# Patient Record
Sex: Male | Born: 1945
Health system: Southern US, Community
[De-identification: ages and names within clinical notes are randomized; demographics above are authoritative.]

## PROBLEM LIST (undated history)

## (undated) DIAGNOSIS — L989 Disorder of the skin and subcutaneous tissue, unspecified: Secondary | ICD-10-CM

## (undated) DIAGNOSIS — I10 Essential (primary) hypertension: Secondary | ICD-10-CM

## (undated) DIAGNOSIS — I2510 Atherosclerotic heart disease of native coronary artery without angina pectoris: Secondary | ICD-10-CM

## (undated) DIAGNOSIS — K219 Gastro-esophageal reflux disease without esophagitis: Secondary | ICD-10-CM

## (undated) DIAGNOSIS — N200 Calculus of kidney: Secondary | ICD-10-CM

## (undated) DIAGNOSIS — I499 Cardiac arrhythmia, unspecified: Secondary | ICD-10-CM

## (undated) DIAGNOSIS — Z87442 Personal history of urinary calculi: Secondary | ICD-10-CM

## (undated) DIAGNOSIS — R002 Palpitations: Secondary | ICD-10-CM

## (undated) DIAGNOSIS — I480 Paroxysmal atrial fibrillation: Secondary | ICD-10-CM

## (undated) DIAGNOSIS — M1990 Unspecified osteoarthritis, unspecified site: Secondary | ICD-10-CM

## (undated) DIAGNOSIS — E785 Hyperlipidemia, unspecified: Secondary | ICD-10-CM

## (undated) DIAGNOSIS — J309 Allergic rhinitis, unspecified: Secondary | ICD-10-CM

## (undated) DIAGNOSIS — M542 Cervicalgia: Secondary | ICD-10-CM

## (undated) HISTORY — DX: Hyperlipidemia, unspecified: E78.5

## (undated) HISTORY — DX: Paroxysmal atrial fibrillation: I48.0

## (undated) HISTORY — PX: URETERAL STENT PLACEMENT: SHX822

## (undated) HISTORY — DX: Disorder of the skin and subcutaneous tissue, unspecified: L98.9

## (undated) HISTORY — PX: OTHER SURGICAL HISTORY: SHX169

## (undated) HISTORY — DX: Cervicalgia: M54.2

## (undated) HISTORY — DX: Gastro-esophageal reflux disease without esophagitis: K21.9

## (undated) HISTORY — DX: Calculus of kidney: N20.0

## (undated) HISTORY — DX: Allergic rhinitis, unspecified: J30.9

## (undated) HISTORY — PX: CORONARY ARTERY BYPASS GRAFT: SHX141

## (undated) HISTORY — PX: THROAT SURGERY: SHX803

## (undated) HISTORY — PX: PROSTATE SURGERY: SHX751

## (undated) HISTORY — PX: CARPAL TUNNEL RELEASE: SHX101

## (undated) HISTORY — PX: HYDROCELE EXCISION / REPAIR: SUR1145

---

## 2005-08-30 HISTORY — PX: COLONOSCOPY: SHX174

## 2007-03-06 ENCOUNTER — Emergency Department: Payer: Self-pay | Admitting: Emergency Medicine

## 2010-08-30 HISTORY — PX: COLONOSCOPY: SHX174

## 2010-08-30 LAB — HM COLONOSCOPY: HM Colonoscopy: NORMAL

## 2012-04-11 ENCOUNTER — Encounter (HOSPITAL_COMMUNITY): Payer: Self-pay | Admitting: Emergency Medicine

## 2012-04-11 ENCOUNTER — Emergency Department (HOSPITAL_COMMUNITY)
Admission: EM | Admit: 2012-04-11 | Discharge: 2012-04-12 | Disposition: A | Discharge status: Home / Self Care | Payer: Medicare Other | Attending: Emergency Medicine | Admitting: Emergency Medicine

## 2012-04-11 DIAGNOSIS — I10 Essential (primary) hypertension: Secondary | ICD-10-CM | POA: Insufficient documentation

## 2012-04-11 DIAGNOSIS — Z87891 Personal history of nicotine dependence: Secondary | ICD-10-CM | POA: Insufficient documentation

## 2012-04-11 DIAGNOSIS — Y9289 Other specified places as the place of occurrence of the external cause: Secondary | ICD-10-CM | POA: Insufficient documentation

## 2012-04-11 DIAGNOSIS — K219 Gastro-esophageal reflux disease without esophagitis: Secondary | ICD-10-CM | POA: Insufficient documentation

## 2012-04-11 DIAGNOSIS — T7840XA Allergy, unspecified, initial encounter: Secondary | ICD-10-CM

## 2012-04-11 DIAGNOSIS — Y93K1 Activity, walking an animal: Secondary | ICD-10-CM | POA: Insufficient documentation

## 2012-04-11 DIAGNOSIS — Y998 Other external cause status: Secondary | ICD-10-CM | POA: Insufficient documentation

## 2012-04-11 HISTORY — DX: Palpitations: R00.2

## 2012-04-11 HISTORY — DX: Essential (primary) hypertension: I10

## 2012-04-11 HISTORY — DX: Calculus of kidney: N20.0

## 2012-04-11 HISTORY — DX: Gastro-esophageal reflux disease without esophagitis: K21.9

## 2012-04-11 NOTE — ED Notes (Signed)
Patient complaining of itching rash from ankles to thighs started yesterday. Small red, raised bumps noted to bilateral ankles radiating to bilateral upper thighs.

## 2012-04-12 MED ORDER — PREDNISONE 10 MG PO TABS: ORAL_TABLET | ORAL | Status: DC

## 2012-04-12 MED ORDER — PREDNISONE 20 MG PO TABS
60.0000 mg | ORAL_TABLET | Freq: Once | ORAL | Status: AC
  Administered 2012-04-12: 60 mg via ORAL
  Filled 2012-04-12: qty 3

## 2012-04-12 MED ORDER — HYDROXYZINE HCL 25 MG PO TABS: 25.0000 mg | ORAL_TABLET | Freq: Four times a day (QID) | ORAL | Status: AC | PRN

## 2012-04-12 NOTE — ED Provider Notes (Signed)
History     CSN: [id number]  Arrival date & time 04/11/12  2047   First MD Initiated Contact with Patient 04/11/12 2256      Chief Complaint  Patient presents with  . Rash    (Consider location/radiation/quality/duration/timing/severity/associated sxs/prior treatment) HPI Comments: Darius Grimes was walking his dog yesterday evening on a gravel road when he developed sudden onset of itching and burning to his bilateral ankles, stating it felt like he had 1000 ants swarming on him although he could visualize no reason for the symptoms.  He was wearing shorts with ankle socks at the time.  His wife who is with him had no similar symptoms.  He developed a rash at his ankles shortly after this event and since then it has spread to his upper legs along with scattered lesions on his forearms and abdomen.  He denies fevers or chills or other complaints.  There is been no drainage from these lesions.  He has been using rubbing alcohol to try to minimize itching but has tried no other medications.  He has had no shortness of breath, swelling or erythema noted at the site of these lesions.  The history is provided by the patient and the spouse.    Past Medical History  Diagnosis Date  . Hypertension   . Heart palpitations   . Acid reflux   . Kidney stones     Past Surgical History  Procedure Date  . Throat surgery   . Prostate surgery     History reviewed. No pertinent family history.  History  Substance Use Topics  . Smoking status: Former Games developer  . Smokeless tobacco: Not on file  . Alcohol Use: No      Review of Systems  Constitutional: Negative for fever and chills.  HENT: Negative for facial swelling.   Respiratory: Negative for shortness of breath and wheezing.   Skin: Positive for rash.  Neurological: Negative for numbness.    Allergies  Review of patient's allergies indicates no known allergies.  Home Medications   Current Outpatient Rx  Name Route Sig  Dispense Refill  . AMLODIPINE BESYLATE 10 MG PO TABS Oral Take 10 mg by mouth every morning.    . ASPIRIN 325 MG PO TABS Oral Take 325 mg by mouth every morning. *If heart rate is elevated in the morning, does not take dose*    . SUPER B COMPLEX PO Oral Take 1 tablet by mouth every morning.    . IBUPROFEN 800 MG PO TABS Oral Take 800 mg by mouth 2 (two) times daily as needed.    [name] LISINOPRIL-HYDROCHLOROTHIAZIDE 20-12.5 MG PO TABS Oral Take 2 tablets by mouth every morning.    [name] METOPROLOL TARTRATE 50 MG PO TABS Oral Take 50 mg by mouth 2 (two) times daily.    . NF FORMULAS TESTOSTERONE PO Oral Take 1 capsule by mouth every morning. DRUG NAME: TEST X180/Testosterone Booster  CONTAINS: Vitamin D 200IU, Vitamin B6 2mg , Vitamin B12 , Growth Enhancement Matrix 400mg  (Includes: Testofen, [place] Ginseng, Panax Ginseng, Codyceps sinesis,Tribulus terrestris    . ADULT MULTIVITAMIN W/MINERALS CH Oral Take 1 tablet by mouth every morning.    [name] OMEPRAZOLE 20 MG PO CPDR Oral Take 20 mg by mouth every morning.    [name] HYDROXYZINE HCL 25 MG PO TABS Oral Take 1 tablet (25 mg total) by mouth every 6 (six) hours as needed for itching. 20 tablet 0  . PREDNISONE 10 MG PO TABS  6, 5, 4,  3, 2 then 1 tablet by mouth daily for 6 days total. 21 tablet 0    BP 146/103  Pulse 87  Temp 98.3 F (36.8 C) (Oral)  Resp 16  Ht 5\' 10"  (1.778 m)  Wt 238 lb (107.956 kg)  BMI 34.15 kg/m2  SpO2 99%  Physical Exam  Constitutional: He appears well-developed and well-nourished. No distress.  HENT:  Head: Normocephalic.  Neck: Neck supple.  Cardiovascular: Normal rate.   Pulmonary/Chest: Effort normal. He has no wheezes.  Musculoskeletal: Normal range of motion. He exhibits no edema.  Skin: Rash noted. No erythema.       Very discrete 1-2 mm slightly raised red papules on his bilateral ankles.  There is no surrounding erythema, edema or red streaking.  The nodules that are on his upper legs to his mid thigh area are  more pink rather than red in coloration also without drainage.  There are no pustules, vesicles or excoriations appreciated.  He also has very few papules on his bilateral forearms.    ED Course  Procedures (including critical care time)  Labs Reviewed - No data to display No results found.   1. Allergic reaction       MDM  Allergic skin reaction is most likely source of symptoms given pruritic character.  No signs of infection at this time.  He was not walking anywhere including through grass or fields where he could come in contact with chiggers or other itch producing pests.  Patient placed on prednisone taper, given first dose prior to discharge.  He was also prescribed Atarax as needed for itching.  Recheck by PCP if not improving over the next several days or if his symptoms change or worsen.    Burgess Amor, PA 04/12/12 1600

## 2012-04-13 NOTE — ED Provider Notes (Signed)
Medical screening examination/treatment/procedure(s) were performed by non-physician practitioner and as supervising physician I was immediately available for consultation/collaboration.  Kaisyn Millea S. Skai Lickteig, MD 04/13/12 0432 

## 2012-06-23 LAB — LIPID PANEL
CHOLESTEROL: 206 mg/dL — AB (ref 0–200)
HDL: 35 mg/dL (ref 35–70)
LDL CALC: 138 mg/dL
Triglycerides: 167 mg/dL — AB (ref 40–160)

## 2012-06-23 LAB — PSA: PSA: NORMAL

## 2012-09-18 ENCOUNTER — Ambulatory Visit: Payer: Self-pay | Admitting: Family Medicine

## 2013-10-01 ENCOUNTER — Emergency Department (HOSPITAL_COMMUNITY)
Admission: EM | Admit: 2013-10-01 | Discharge: 2013-10-01 | Disposition: A | Discharge status: Home / Self Care | Payer: Medicare Other | Attending: Emergency Medicine | Admitting: Emergency Medicine

## 2013-10-01 ENCOUNTER — Encounter (HOSPITAL_COMMUNITY): Payer: Self-pay | Admitting: Emergency Medicine

## 2013-10-01 DIAGNOSIS — Z87891 Personal history of nicotine dependence: Secondary | ICD-10-CM | POA: Insufficient documentation

## 2013-10-01 DIAGNOSIS — K5289 Other specified noninfective gastroenteritis and colitis: Secondary | ICD-10-CM | POA: Insufficient documentation

## 2013-10-01 DIAGNOSIS — K529 Noninfective gastroenteritis and colitis, unspecified: Secondary | ICD-10-CM

## 2013-10-01 DIAGNOSIS — Z79899 Other long term (current) drug therapy: Secondary | ICD-10-CM | POA: Insufficient documentation

## 2013-10-01 DIAGNOSIS — K219 Gastro-esophageal reflux disease without esophagitis: Secondary | ICD-10-CM | POA: Insufficient documentation

## 2013-10-01 DIAGNOSIS — Z87442 Personal history of urinary calculi: Secondary | ICD-10-CM | POA: Insufficient documentation

## 2013-10-01 DIAGNOSIS — I10 Essential (primary) hypertension: Secondary | ICD-10-CM | POA: Insufficient documentation

## 2013-10-01 DIAGNOSIS — Z7982 Long term (current) use of aspirin: Secondary | ICD-10-CM | POA: Insufficient documentation

## 2013-10-01 LAB — COMPREHENSIVE METABOLIC PANEL
ALBUMIN: 3.7 g/dL (ref 3.5–5.2)
ALK PHOS: 56 U/L (ref 39–117)
ALT: 16 U/L (ref 0–53)
AST: 23 U/L (ref 0–37)
BILIRUBIN TOTAL: 0.5 mg/dL (ref 0.3–1.2)
BUN: 16 mg/dL (ref 6–23)
CHLORIDE: 103 meq/L (ref 96–112)
CO2: 29 meq/L (ref 19–32)
Calcium: 8.9 mg/dL (ref 8.4–10.5)
Creatinine, Ser: 0.89 mg/dL (ref 0.50–1.35)
GFR calc Af Amer: >90 mL/min (ref >90)
GFR, EST NON AFRICAN AMERICAN: 87 mL/min — AB (ref >90)
Glucose, Bld: 129 mg/dL — ABNORMAL HIGH (ref 70–99)
POTASSIUM: 3.1 meq/L — AB (ref 3.7–5.3)
SODIUM: 144 meq/L (ref 137–147)
Total Protein: 6.9 g/dL (ref 6.0–8.3)

## 2013-10-01 LAB — CBC WITH DIFFERENTIAL/PLATELET
BASOS ABS: 0 10*3/uL (ref 0.0–0.1)
BASOS PCT: 1 % (ref 0–1)
Eosinophils Absolute: 0.1 10*3/uL (ref 0.0–0.7)
Eosinophils Relative: 2 % (ref 0–5)
HEMATOCRIT: 42.2 % (ref 39.0–52.0)
Hemoglobin: 14.9 g/dL (ref 13.0–17.0)
LYMPHS PCT: 26 % (ref 12–46)
Lymphs Abs: 1.7 10*3/uL (ref 0.7–4.0)
MCH: 31.2 pg (ref 26.0–34.0)
MCHC: 35.3 g/dL (ref 30.0–36.0)
MCV: 88.5 fL (ref 78.0–100.0)
MONO ABS: 0.4 10*3/uL (ref 0.1–1.0)
Monocytes Relative: 6 % (ref 3–12)
NEUTROS ABS: 4.2 10*3/uL (ref 1.7–7.7)
NEUTROS PCT: 65 % (ref 43–77)
Platelets: 200 10*3/uL (ref 150–400)
RBC: 4.77 MIL/uL (ref 4.22–5.81)
RDW: 12.5 % (ref 11.5–15.5)
WBC: 6.4 10*3/uL (ref 4.0–10.5)

## 2013-10-01 MED ORDER — POTASSIUM CHLORIDE CRYS ER 20 MEQ PO TBCR
40.0000 meq | EXTENDED_RELEASE_TABLET | Freq: Once | ORAL | Status: AC
  Administered 2013-10-01: 40 meq via ORAL
  Filled 2013-10-01: qty 2

## 2013-10-01 MED ORDER — SODIUM CHLORIDE 0.9 % IV BOLUS (SEPSIS)
1000.0000 mL | Freq: Once | INTRAVENOUS | Status: AC
  Administered 2013-10-01: 1000 mL via INTRAVENOUS

## 2013-10-01 NOTE — ED Notes (Signed)
Discharge instructions reviewed with pt, questions answered. Pt verbalized understanding.  

## 2013-10-01 NOTE — Discharge Instructions (Signed)
Drink plenty of fluids and follow up with your md if any problems

## 2013-10-01 NOTE — ED Provider Notes (Signed)
CSN: [id number]     Arrival date & time 10/01/13  1444 History   First MD Initiated Contact with Patient 10/01/13 1510     Chief Complaint  Patient presents with  . Abdominal Pain   (Consider location/radiation/quality/duration/timing/severity/associated sxs/prior Treatment) Patient is a 68 y.o. male presenting with diarrhea. The history is provided by the patient (the pt complains of diarhea for a couple days with abd cramps).  Diarrhea Quality:  Watery Severity:  Moderate Onset quality:  Sudden Timing:  Constant Progression:  Unchanged Relieved by:  Nothing Associated symptoms: no abdominal pain and no headaches     Past Medical History  Diagnosis Date  . Hypertension   . Heart palpitations   . Acid reflux   . Kidney stones    Past Surgical History  Procedure Laterality Date  . Throat surgery    . Prostate surgery     History reviewed. No pertinent family history. History  Substance Use Topics  . Smoking status: Former Smoker -- 1.00 packs/day for 7 years    Types: Cigarettes    Quit date: 08/31/1983  . Smokeless tobacco: Never Used  . Alcohol Use: No    Review of Systems  Constitutional: Negative for appetite change and fatigue.  HENT: Negative for congestion, ear discharge and sinus pressure.   Eyes: Negative for discharge.  Respiratory: Negative for cough.   Cardiovascular: Negative for chest pain.  Gastrointestinal: Positive for diarrhea. Negative for abdominal pain.  Genitourinary: Negative for frequency and hematuria.  Musculoskeletal: Negative for back pain.  Skin: Negative for rash.  Neurological: Negative for seizures and headaches.  Psychiatric/Behavioral: Negative for hallucinations.    Allergies  Review of patient's allergies indicates no known allergies.  Home Medications   Current Outpatient Rx  Name  Route  Sig  Dispense  Refill  . amLODipine (NORVASC) 10 MG tablet   Oral   Take 10 mg by mouth every morning.         [name] aspirin 325 MG  tablet   Oral   Take 325 mg by mouth every morning. *If heart rate is elevated in the morning, does not take dose*         . B Complex-C (SUPER B COMPLEX PO)   Oral   Take 1 tablet by mouth every morning.         [name] ibuprofen (ADVIL,MOTRIN) 800 MG tablet   Oral   Take 800 mg by mouth 2 (two) times daily as needed for moderate pain.          [name] lisinopril-hydrochlorothiazide (PRINZIDE,ZESTORETIC) 20-12.5 MG per tablet   Oral   Take 2 tablets by mouth every morning.         . metoprolol (LOPRESSOR) 50 MG tablet   Oral   Take 25 mg by mouth 2 (two) times daily.          . Misc Natural Products (NF FORMULAS TESTOSTERONE PO)   Oral   Take 1 capsule by mouth every morning. DRUG NAME: TEST X180/Testosterone Booster  CONTAINS: Vitamin D 200IU, Vitamin B6 2mg , Vitamin B12 133mcg, Growth Enhancement Matrix 400mg  (Includes: Testofen, [place] Ginseng, Panax Ginseng, Codyceps sinesis,Tribulus terrestris         . Multiple Vitamin (MULTIVITAMIN WITH MINERALS) TABS   Oral   Take 1 tablet by mouth every morning.         [name] omeprazole (PRILOSEC) 20 MG capsule   Oral   Take 20 mg by mouth daily as needed (acid  reflux).  BP 153/92  Pulse 109  Temp(Src) 97.6 F (36.4 C) (Oral)  Resp 18  Ht 5' 10.5" (1.791 m)  Wt 240 lb (108.863 kg)  BMI 33.94 kg/m2  SpO2 98% Physical Exam  Constitutional: He is oriented to person, place, and time. He appears well-developed.  HENT:  Head: Normocephalic.  Eyes: Conjunctivae and EOM are normal. No scleral icterus.  Neck: Neck supple. No thyromegaly present.  Cardiovascular: Normal rate and regular rhythm.  Exam reveals no gallop and no friction rub.   No murmur heard. Pulmonary/Chest: No stridor. He has no wheezes. He has no rales. He exhibits no tenderness.  Abdominal: He exhibits no distension. There is no tenderness. There is no rebound.  Musculoskeletal: Normal range of motion. He exhibits no edema.  Lymphadenopathy:    He  has no cervical adenopathy.  Neurological: He is oriented to person, place, and time. He exhibits normal muscle tone. Coordination normal.  Skin: No rash noted. No erythema.  Psychiatric: He has a normal mood and affect. His behavior is normal.    ED Course  Procedures (including critical care time) Labs Review Labs Reviewed  COMPREHENSIVE METABOLIC PANEL - Abnormal; Notable for the following:    Potassium 3.1 (*)    Glucose, Bld 129 (*)    GFR calc non Af Amer 87 (*)    All other components within normal limits  CBC WITH DIFFERENTIAL   Imaging Review No results found.  EKG Interpretation   None       MDM   1. Gastroenteritis        Maudry Diego, MD 10/01/13 [phone]

## 2013-10-01 NOTE — ED Notes (Signed)
Patient c/o abd pain with nausea and diarrhea. Denies any vomiting. Patient unsure of any fevers but reports "chills and sweats."

## 2013-12-24 ENCOUNTER — Emergency Department: Payer: Self-pay | Admitting: Emergency Medicine

## 2013-12-24 LAB — BASIC METABOLIC PANEL
ANION GAP: 5 — AB (ref 7–16)
BUN: 11 mg/dL (ref 7–18)
CALCIUM: 9.2 mg/dL (ref 8.5–10.1)
CHLORIDE: 103 mmol/L (ref 98–107)
CO2: 32 mmol/L (ref 21–32)
CREATININE: 0.96 mg/dL (ref 0.60–1.30)
Glucose: 112 mg/dL — ABNORMAL HIGH (ref 65–99)
OSMOLALITY: 280 (ref 275–301)
Potassium: 3.3 mmol/L — ABNORMAL LOW (ref 3.5–5.1)
Sodium: 140 mmol/L (ref 136–145)

## 2013-12-24 LAB — CBC
HCT: 47.3 % (ref 40.0–52.0)
HGB: 16.2 g/dL (ref 13.0–18.0)
MCH: 31 pg (ref 26.0–34.0)
MCHC: 34.2 g/dL (ref 32.0–36.0)
MCV: 91 fL (ref 80–100)
Platelet: 216 10*3/uL (ref 150–440)
RBC: 5.22 10*6/uL (ref 4.40–5.90)
RDW: 13.5 % (ref 11.5–14.5)
WBC: 7.9 10*3/uL (ref 3.8–10.6)

## 2013-12-24 LAB — TROPONIN I

## 2013-12-24 LAB — PRO B NATRIURETIC PEPTIDE: B-TYPE NATIURETIC PEPTID: 310 pg/mL — AB (ref 0–125)

## 2013-12-24 LAB — CK TOTAL AND CKMB (NOT AT ARMC)
CK, TOTAL: 56 U/L
CK-MB: 1.7 ng/mL (ref 0.5–3.6)

## 2014-08-30 HISTORY — PX: CORONARY ARTERY BYPASS GRAFT: SHX141

## 2014-10-09 ENCOUNTER — Emergency Department (HOSPITAL_COMMUNITY): Payer: Medicare Other

## 2014-10-09 ENCOUNTER — Encounter (HOSPITAL_COMMUNITY): Payer: Self-pay | Admitting: Emergency Medicine

## 2014-10-09 ENCOUNTER — Emergency Department (HOSPITAL_COMMUNITY)
Admission: EM | Admit: 2014-10-09 | Discharge: 2014-10-09 | Disposition: A | Discharge status: Home / Self Care | Payer: Medicare Other | Attending: Emergency Medicine | Admitting: Emergency Medicine

## 2014-10-09 DIAGNOSIS — Z87891 Personal history of nicotine dependence: Secondary | ICD-10-CM | POA: Diagnosis not present

## 2014-10-09 DIAGNOSIS — Y9289 Other specified places as the place of occurrence of the external cause: Secondary | ICD-10-CM | POA: Insufficient documentation

## 2014-10-09 DIAGNOSIS — Z79899 Other long term (current) drug therapy: Secondary | ICD-10-CM | POA: Insufficient documentation

## 2014-10-09 DIAGNOSIS — S80211A Abrasion, right knee, initial encounter: Secondary | ICD-10-CM | POA: Diagnosis not present

## 2014-10-09 DIAGNOSIS — Y9389 Activity, other specified: Secondary | ICD-10-CM | POA: Diagnosis not present

## 2014-10-09 DIAGNOSIS — K219 Gastro-esophageal reflux disease without esophagitis: Secondary | ICD-10-CM | POA: Diagnosis not present

## 2014-10-09 DIAGNOSIS — I10 Essential (primary) hypertension: Secondary | ICD-10-CM | POA: Insufficient documentation

## 2014-10-09 DIAGNOSIS — W01110A Fall on same level from slipping, tripping and stumbling with subsequent striking against sharp glass, initial encounter: Secondary | ICD-10-CM | POA: Insufficient documentation

## 2014-10-09 DIAGNOSIS — Y998 Other external cause status: Secondary | ICD-10-CM | POA: Insufficient documentation

## 2014-10-09 DIAGNOSIS — Z87442 Personal history of urinary calculi: Secondary | ICD-10-CM | POA: Insufficient documentation

## 2014-10-09 DIAGNOSIS — S8991XA Unspecified injury of right lower leg, initial encounter: Secondary | ICD-10-CM | POA: Diagnosis present

## 2014-10-09 DIAGNOSIS — S8001XA Contusion of right knee, initial encounter: Secondary | ICD-10-CM | POA: Diagnosis not present

## 2014-10-09 DIAGNOSIS — W010XXA Fall on same level from slipping, tripping and stumbling without subsequent striking against object, initial encounter: Secondary | ICD-10-CM

## 2014-10-09 DIAGNOSIS — Z7982 Long term (current) use of aspirin: Secondary | ICD-10-CM | POA: Diagnosis not present

## 2014-10-09 NOTE — ED Provider Notes (Signed)
CSN: [id number]     Arrival date & time 10/09/14  0617 History   First MD Initiated Contact with Patient 10/09/14 0622     Chief Complaint  Patient presents with  . Fall     (Consider location/radiation/quality/duration/timing/severity/associated sxs/prior Treatment) Patient is a 69 y.o. male presenting with fall. The history is provided by the patient.  Fall  He tripped going up some steps and his right knee hit a glass door. He severed an abrasion to the knee. He has had a lot of pain in the knee since the fall. He rates pain at 7/10 at rest, but 10/10 if he tries to walk on it. He denies other injury. Injury occurred at about 10 PM. Of note, he is on aspirin, but no other antiplatelet agents and no anticoagulants. Last tetanus immunization was within the past year. He took a dose of oxycodone-acetaminophen last night, and took a dose of ibuprofen 800 mg this morning. He states that sometimes it feels like his knee is getting locked in the posterior aspect.  Past Medical History  Diagnosis Date  . Hypertension   . Heart palpitations   . Acid reflux   . Kidney stones    Past Surgical History  Procedure Laterality Date  . Throat surgery    . Prostate surgery     No family history on file. History  Substance Use Topics  . Smoking status: Former Smoker -- 1.00 packs/day for 7 years    Types: Cigarettes    Quit date: 08/31/1983  . Smokeless tobacco: Never Used  . Alcohol Use: No    Review of Systems  All other systems reviewed and are negative.     Allergies  Review of patient's allergies indicates no known allergies.  Home Medications   Prior to Admission medications   Medication Sig Start Date End Date Taking? Authorizing Provider  amLODipine (NORVASC) 10 MG tablet Take 10 mg by mouth every morning.    Historical Provider, MD  aspirin 325 MG tablet Take 325 mg by mouth every morning. *If heart rate is elevated in the morning, does not take dose*    Historical  Provider, MD  B Complex-C (SUPER B COMPLEX PO) Take 1 tablet by mouth every morning.    Historical Provider, MD  ibuprofen (ADVIL,MOTRIN) 800 MG tablet Take 800 mg by mouth 2 (two) times daily as needed for moderate pain.     Historical Provider, MD  lisinopril-hydrochlorothiazide (PRINZIDE,ZESTORETIC) 20-12.5 MG per tablet Take 2 tablets by mouth every morning.    Historical Provider, MD  metoprolol (LOPRESSOR) 50 MG tablet Take 25 mg by mouth 2 (two) times daily.     Historical Provider, MD  Misc Natural Products (NF FORMULAS TESTOSTERONE PO) Take 1 capsule by mouth every morning. DRUG NAME: TEST X180/Testosterone Booster  CONTAINS: Vitamin D 200IU, Vitamin B6 2mg , Vitamin B12 148mcg, Growth Enhancement Matrix 400mg  (Includes: Testofen, [place] Ginseng, Panax Ginseng, Codyceps sinesis,Tribulus terrestris    Historical Provider, MD  Multiple Vitamin (MULTIVITAMIN WITH MINERALS) TABS Take 1 tablet by mouth every morning.    Historical Provider, MD  omeprazole (PRILOSEC) 20 MG capsule Take 20 mg by mouth daily as needed (acid  reflux).     Historical Provider, MD   BP 126/95 mmHg  Pulse 87  Temp(Src) 97.7 F (36.5 C) (Oral)  Resp 14  SpO2 96% Physical Exam  Nursing note and vitals reviewed.  69 year old male, resting comfortably and in no acute distress. Vital signs are difficult for  mild hypertension. Oxygen saturation is 96%, which is normal. Head is normocephalic and atraumatic. PERRLA, EOMI. Oropharynx is clear. Neck is nontender and supple without adenopathy or JVD. Back is nontender and there is no CVA tenderness. Lungs are clear without rales, wheezes, or rhonchi. Chest is nontender. Heart has regular rate and rhythm without murmur. Abdomen is soft, flat, nontender without masses or hepatosplenomegaly and peristalsis is normoactive. Extremities: Abrasion is present over the proximal right lower leg without discrete laceration. There is tenderness to palpation in this area. There is  no significant knee effusion present. There is no instability of the knee joint. There is pain with passive range of motion and I am unable to perform Lachman or McMurray's maneuvers. Distal neurovascular exam is intact. He has 1-2+ pitting edema with mild to moderate venous stasis changes present bilaterally. Skin is warm and dry without rash. Neurologic: Mental status is normal, cranial nerves are intact, there are no motor or sensory deficits.  ED Course  Procedures (including critical care time)  Imaging Review Dg Knee Complete 4 Views Right  10/09/2014   CLINICAL DATA:  Status post fall while walking dog; posterior right knee pain. Initial encounter.  EXAM: RIGHT KNEE - COMPLETE 4+ VIEW  COMPARISON:  None.  FINDINGS: There is no evidence of fracture or dislocation. There is mild narrowing of the medial and patellofemoral compartments, with small marginal osteophytes noted arising at all three compartments. Wall osteophytes and tibial spine osteophytes are also seen. A fabella is noted. Minimal degenerative calcification is noted about the fibular head.  A small to moderate knee joint effusion is noted. The visualized soft tissues are otherwise unremarkable in appearance.  IMPRESSION: 1. No evidence of acute fracture or dislocation. 2. Mild narrowing of the medial and patellofemoral compartments, with mild tricompartmental osteoarthritis. 3. Small to moderate knee joint effusion.   Electronically Signed   By: Garald Balding M.D.   On: 10/09/2014 06:58   Images viewed by me.  MDM   Final diagnoses:  Fall from slip, trip, or stumble  Contusion of right knee, initial encounter  Abrasion of right knee, initial encounter    Fall with injury to the right knee and proximal lower leg. He is sent for x-rays.  X-rays are negative for fracture, although official radiologist interpretation is pending at this time. I've discussed this with the patient. He has a knee immobilizer at home and he has  oxycodone-acetaminophen at home as well as ibuprofen. He was offered crutches and a walker and declined both of them. He is given contusion and abrasion instructions and he is referred to orthopedics for follow-up.    Delora Fuel, MD 58/83/25 [id number]

## 2014-10-09 NOTE — ED Notes (Signed)
Pt. Reports tripping last night and injuring right knee. Pt. Denies hitting head or any other pain.

## 2014-10-09 NOTE — Discharge Instructions (Signed)
Wear your immobilizer as needed. Take your oxycodone-acetaminophen as needed. Take your ibuprofen as needed for less severe pain.   Contusion A contusion is a deep bruise. Contusions are the result of an injury that caused bleeding under the skin. The contusion may turn blue, purple, or yellow. Minor injuries will give you a painless contusion, but more severe contusions may stay painful and swollen for a few weeks.  CAUSES  A contusion is usually caused by a blow, trauma, or direct force to an area of the body. SYMPTOMS   Swelling and redness of the injured area.  Bruising of the injured area.  Tenderness and soreness of the injured area.  Pain. DIAGNOSIS  The diagnosis can be made by taking a history and physical exam. An X-ray, CT scan, or MRI may be needed to determine if there were any associated injuries, such as fractures. TREATMENT  Specific treatment will depend on what area of the body was injured. In general, the best treatment for a contusion is resting, icing, elevating, and applying cold compresses to the injured area. Over-the-counter medicines may also be recommended for pain control. Ask your caregiver what the best treatment is for your contusion. HOME CARE INSTRUCTIONS   Put ice on the injured area.  Put ice in a plastic bag.  Place a towel between your skin and the bag.  Leave the ice on for 15-20 minutes, 3-4 times a day, or as directed by your health care provider.  Only take over-the-counter or prescription medicines for pain, discomfort, or fever as directed by your caregiver. Your caregiver may recommend avoiding anti-inflammatory medicines (aspirin, ibuprofen, and naproxen) for 48 hours because these medicines may increase bruising.  Rest the injured area.  If possible, elevate the injured area to reduce swelling. SEEK IMMEDIATE MEDICAL CARE IF:   You have increased bruising or swelling.  You have pain that is getting worse.  Your swelling or pain is  not relieved with medicines. MAKE SURE YOU:   Understand these instructions.  Will watch your condition.  Will get help right away if you are not doing well or get worse. Document Released: 05/26/2005 Document Revised: 08/21/2013 Document Reviewed: 06/21/2011 [facility] Patient Information 2015 [place], [place]. This information is not intended to replace advice given to you by your health care provider. Make sure you discuss any questions you have with your health care provider.  Abrasion An abrasion is a cut or scrape of the skin. Abrasions do not extend through all layers of the skin and most heal within 10 days. It is important to care for your abrasion properly to prevent infection. CAUSES  Most abrasions are caused by falling on, or gliding across, the ground or other surface. When your skin rubs on something, the outer and inner layer of skin rubs off, causing an abrasion. DIAGNOSIS  Your caregiver will be able to diagnose an abrasion during a physical exam.  TREATMENT  Your treatment depends on how large and deep the abrasion is. Generally, your abrasion will be cleaned with water and a mild soap to remove any dirt or debris. An antibiotic ointment may be put over the abrasion to prevent an infection. A bandage (dressing) may be wrapped around the abrasion to keep it from getting dirty.  You may need a tetanus shot if:  You cannot remember when you had your last tetanus shot.  You have never had a tetanus shot.  The injury broke your skin. If you get a tetanus shot, your arm  may swell, get red, and feel warm to the touch. This is common and not a problem. If you need a tetanus shot and you choose not to have one, there is a rare chance of getting tetanus. Sickness from tetanus can be serious.  HOME CARE INSTRUCTIONS   If a dressing was applied, change it at least once a day or as directed by your caregiver. If the bandage sticks, soak it off with warm water.   Wash the area with  water and a mild soap to remove all the ointment 2 times a day. Rinse off the soap and pat the area dry with a clean towel.   Reapply any ointment as directed by your caregiver. This will help prevent infection and keep the bandage from sticking. Use gauze over the wound and under the dressing to help keep the bandage from sticking.   Change your dressing right away if it becomes wet or dirty.   Only take over-the-counter or prescription medicines for pain, discomfort, or fever as directed by your caregiver.   Follow up with your caregiver within 24-48 hours for a wound check, or as directed. If you were not given a wound-check appointment, look closely at your abrasion for redness, swelling, or pus. These are signs of infection. SEEK IMMEDIATE MEDICAL CARE IF:   You have increasing pain in the wound.   You have redness, swelling, or tenderness around the wound.   You have pus coming from the wound.   You have a fever or persistent symptoms for more than 2-3 days.  You have a fever and your symptoms suddenly get worse.  You have a bad smell coming from the wound or dressing.  MAKE SURE YOU:   Understand these instructions.  Will watch your condition.  Will get help right away if you are not doing well or get worse. Document Released: 05/26/2005 Document Revised: 08/02/2012 Document Reviewed: 07/20/2011 [facility] Patient Information 2015 [place], [place]. This information is not intended to replace advice given to you by your health care provider. Make sure you discuss any questions you have with your health care provider.  Fall Prevention and Home Safety Falls cause injuries and can affect all age groups. It is possible to use preventive measures to significantly decrease the likelihood of falls. There are many simple measures which can make your home safer and prevent falls. OUTDOORS  Repair cracks and edges of walkways and driveways.  Remove high doorway  thresholds.  Trim shrubbery on the main path into your home.  Have good outside lighting.  Clear walkways of tools, rocks, debris, and clutter.  Check that handrails are not broken and are securely fastened. Both sides of steps should have handrails.  Have leaves, snow, and ice cleared regularly.  Use sand or salt on walkways during winter months.  In the garage, clean up grease or oil spills. BATHROOM  Install night lights.  Install grab bars by the toilet and in the tub and shower.  Use non-skid mats or decals in the tub or shower.  Place a plastic non-slip stool in the shower to sit on, if needed.  Keep floors dry and clean up all water on the floor immediately.  Remove soap buildup in the tub or shower on a regular basis.  Secure bath mats with non-slip, double-sided rug tape.  Remove throw rugs and tripping hazards from the floors. BEDROOMS  Install night lights.  Make sure a bedside light is easy to reach.  Do not use  oversized bedding.  Keep a telephone by your bedside.  Have a firm chair with side arms to use for getting dressed.  Remove throw rugs and tripping hazards from the floor. KITCHEN  Keep handles on pots and pans turned toward the center of the stove. Use back burners when possible.  Clean up spills quickly and allow time for drying.  Avoid walking on wet floors.  Avoid hot utensils and knives.  Position shelves so they are not too high or low.  Place commonly used objects within easy reach.  If necessary, use a sturdy step stool with a grab bar when reaching.  Keep electrical cables out of the way.  Do not use floor polish or wax that makes floors slippery. If you must use wax, use non-skid floor wax.  Remove throw rugs and tripping hazards from the floor. STAIRWAYS  Never leave objects on stairs.  Place handrails on both sides of stairways and use them. Fix any loose handrails. Make sure handrails on both sides of the stairways  are as long as the stairs.  Check carpeting to make sure it is firmly attached along stairs. Make repairs to worn or loose carpet promptly.  Avoid placing throw rugs at the top or bottom of stairways, or properly secure the rug with carpet tape to prevent slippage. Get rid of throw rugs, if possible.  Have an electrician put in a light switch at the top and bottom of the stairs. OTHER FALL PREVENTION TIPS  Wear low-heel or rubber-soled shoes that are supportive and fit well. Wear closed toe shoes.  When using a stepladder, make sure it is fully opened and both spreaders are firmly locked. Do not climb a closed stepladder.  Add color or contrast paint or tape to grab bars and handrails in your home. Place contrasting color strips on first and last steps.  Learn and use mobility aids as needed. Install an electrical emergency response system.  Turn on lights to avoid dark areas. Replace light bulbs that burn out immediately. Get light switches that glow.  Arrange furniture to create clear pathways. Keep furniture in the same place.  Firmly attach carpet with non-skid or double-sided tape.  Eliminate uneven floor surfaces.  Select a carpet pattern that does not visually hide the edge of steps.  Be aware of all pets. OTHER HOME SAFETY TIPS  Set the water temperature for 120 F (48.8 C).  Keep emergency numbers on or near the telephone.  Keep smoke detectors on every level of the home and near sleeping areas. Document Released: 08/06/2002 Document Revised: 02/15/2012 Document Reviewed: 11/05/2011 [facility] Patient Information 2015 [place], [place]. This information is not intended to replace advice given to you by your health care provider. Make sure you discuss any questions you have with your health care provider.

## 2014-10-09 NOTE — ED Notes (Signed)
MD at bedside. 

## 2015-02-11 IMAGING — DX DG KNEE COMPLETE 4+V*R*
4 series · 4 of 4 positions shown · non-contrast
Comparison: None.

CLINICAL DATA: Status post fall while walking dog; posterior right
knee pain. Initial encounter.

EXAM:
RIGHT KNEE - COMPLETE 4+ VIEW

[knee ap]
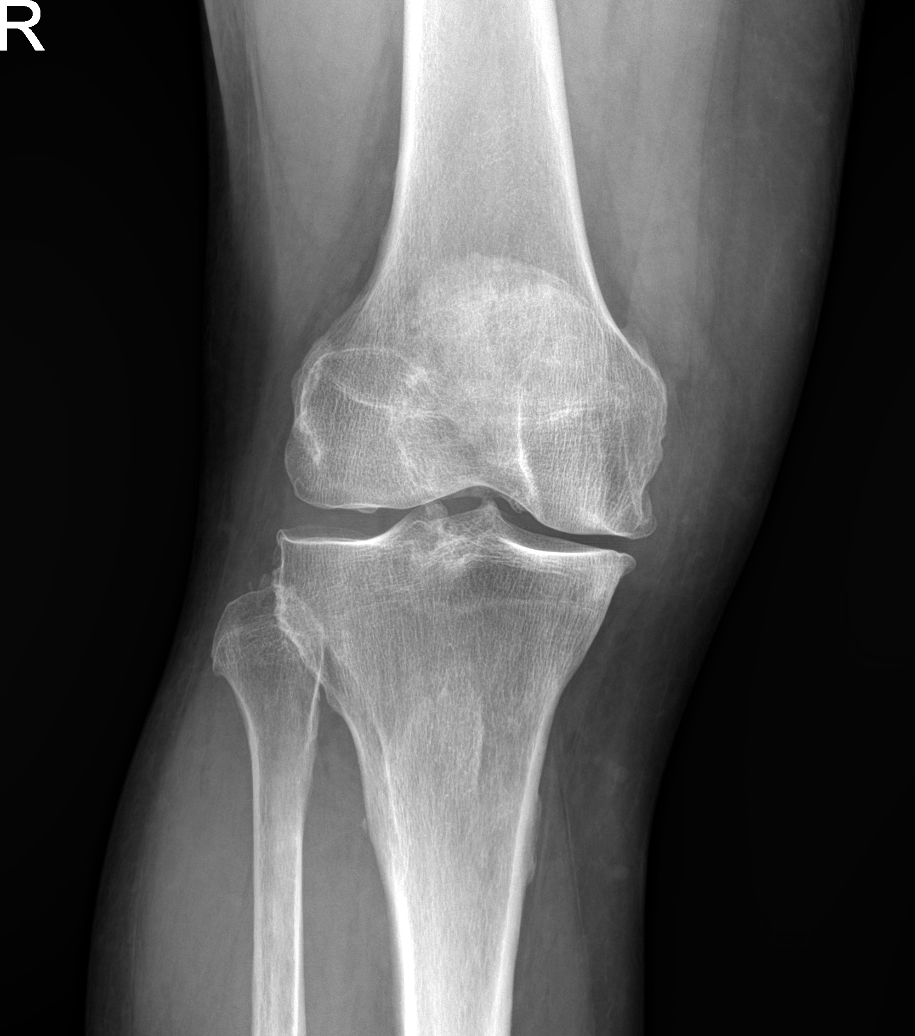

[knee obl (1 of 2)]
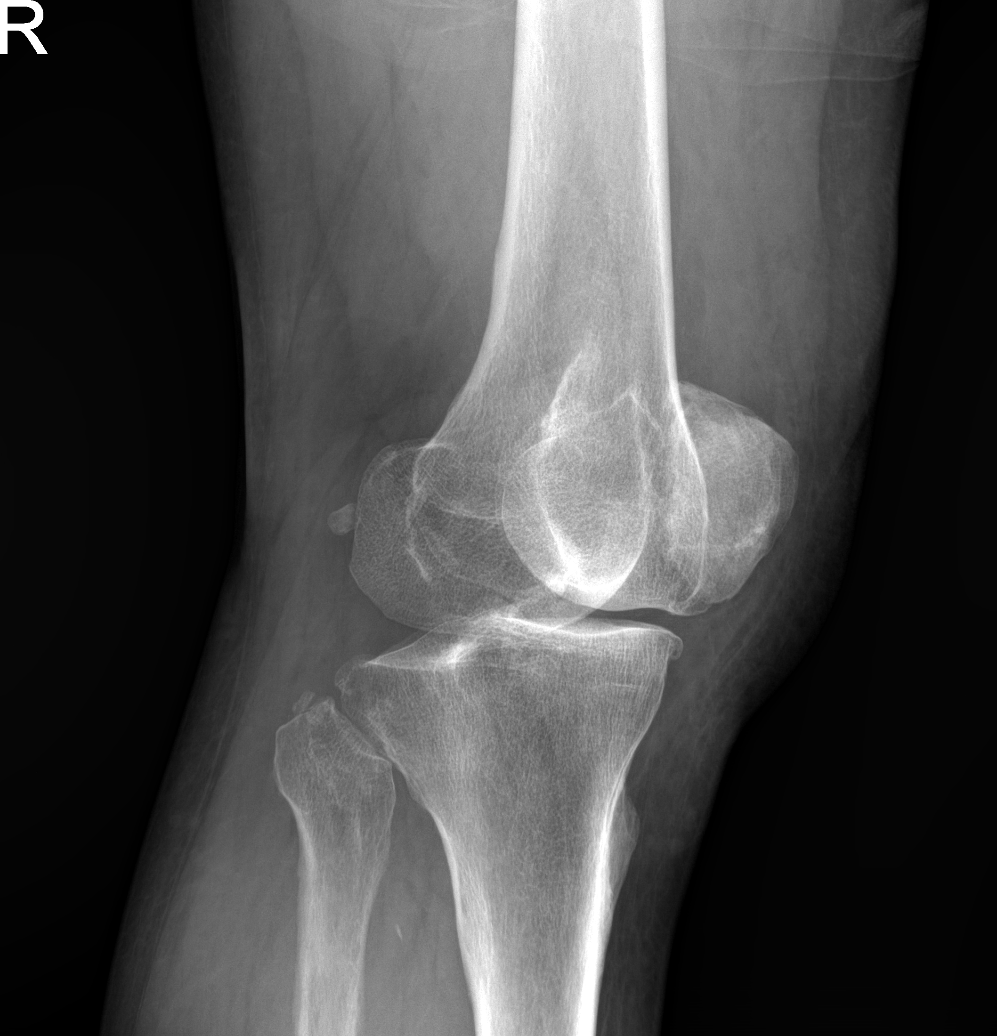

[knee obl (2 of 2)]
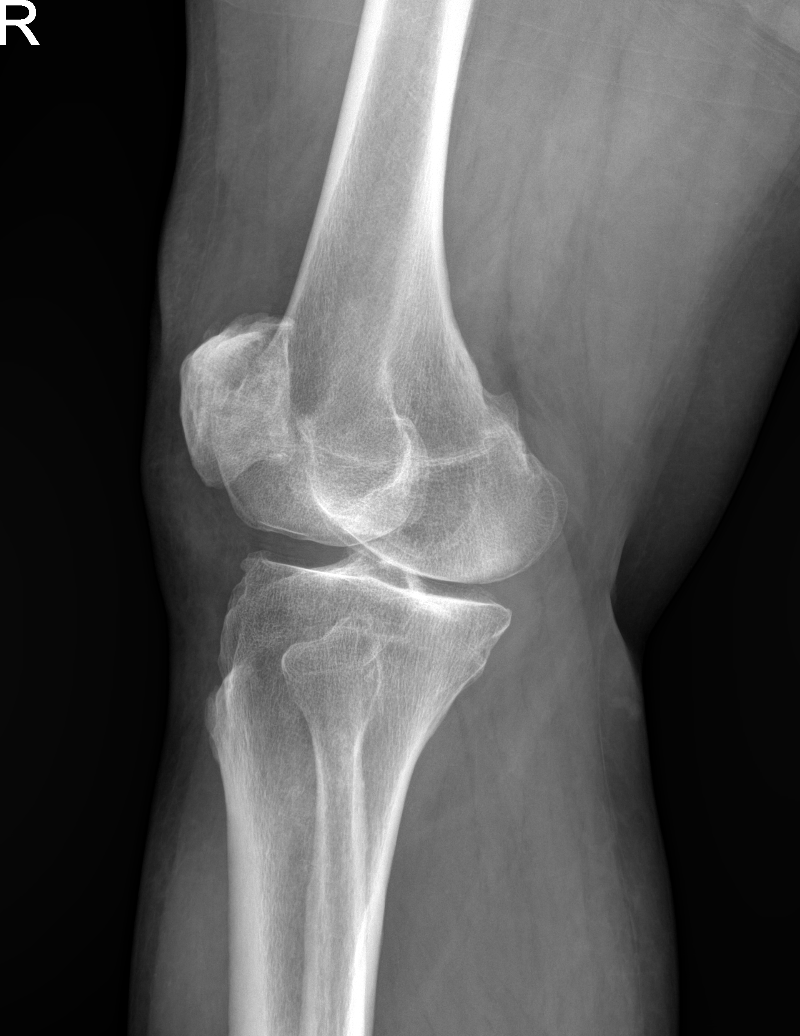

[knee lat]
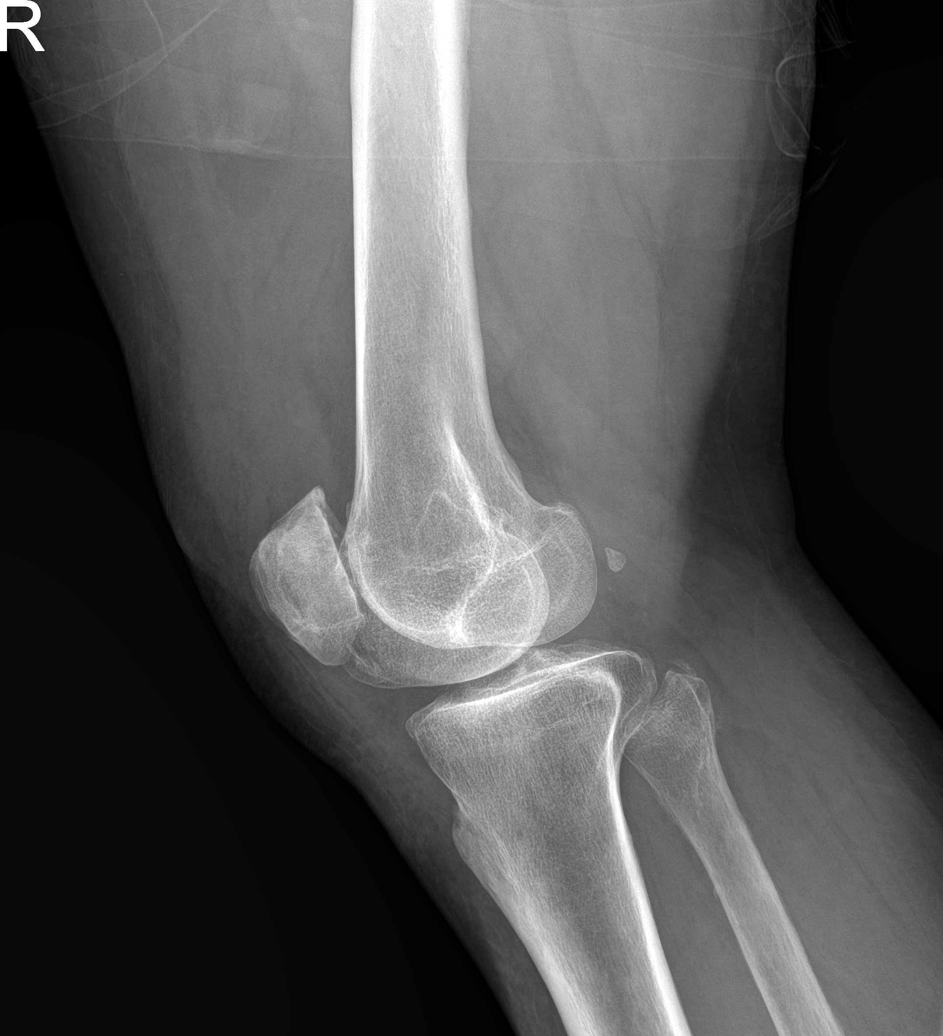

[4 of 4 positions shown; findings below may reference images not displayed]

FINDINGS: There is no evidence of fracture or dislocation. There is mild
narrowing of the medial and patellofemoral compartments, with small
marginal osteophytes noted arising at all three compartments. Wall
osteophytes and tibial spine osteophytes are also seen. A fabella is
noted. Minimal degenerative calcification is noted about the fibular
head.

A small to moderate knee joint effusion is noted. The visualized
soft tissues are otherwise unremarkable in appearance.
IMPRESSION: 1. No evidence of acute fracture or dislocation.
2. Mild narrowing of the medial and patellofemoral compartments,
with mild tricompartmental osteoarthritis.
3. Small to moderate knee joint effusion.

## 2015-03-06 DIAGNOSIS — Z951 Presence of aortocoronary bypass graft: Secondary | ICD-10-CM | POA: Insufficient documentation

## 2015-03-19 ENCOUNTER — Encounter: Payer: Self-pay | Admitting: Family Medicine

## 2015-03-19 ENCOUNTER — Ambulatory Visit (INDEPENDENT_AMBULATORY_CARE_PROVIDER_SITE_OTHER): Payer: Medicare Other | Admitting: Family Medicine

## 2015-03-19 VITALS — BP 142/68 | HR 71 | Temp 98.0°F | Resp 18 | Ht 68.5 in | Wt 254.7 lb

## 2015-03-19 DIAGNOSIS — Z951 Presence of aortocoronary bypass graft: Secondary | ICD-10-CM

## 2015-03-19 DIAGNOSIS — N200 Calculus of kidney: Secondary | ICD-10-CM | POA: Insufficient documentation

## 2015-03-19 DIAGNOSIS — M5412 Radiculopathy, cervical region: Secondary | ICD-10-CM | POA: Insufficient documentation

## 2015-03-19 DIAGNOSIS — E785 Hyperlipidemia, unspecified: Secondary | ICD-10-CM | POA: Insufficient documentation

## 2015-03-19 DIAGNOSIS — R21 Rash and other nonspecific skin eruption: Secondary | ICD-10-CM

## 2015-03-19 DIAGNOSIS — K219 Gastro-esophageal reflux disease without esophagitis: Secondary | ICD-10-CM | POA: Insufficient documentation

## 2015-03-19 DIAGNOSIS — J309 Allergic rhinitis, unspecified: Secondary | ICD-10-CM | POA: Insufficient documentation

## 2015-03-19 DIAGNOSIS — I10 Essential (primary) hypertension: Secondary | ICD-10-CM | POA: Insufficient documentation

## 2015-03-19 DIAGNOSIS — R600 Localized edema: Secondary | ICD-10-CM | POA: Diagnosis not present

## 2015-03-19 DIAGNOSIS — I480 Paroxysmal atrial fibrillation: Secondary | ICD-10-CM | POA: Insufficient documentation

## 2015-03-19 MED ORDER — MEDICAL COMPRESSION STOCKINGS MISC: 2.0000 [IU] | Freq: Every day | Status: DC

## 2015-03-19 MED ORDER — PIMECROLIMUS 1 % EX CREA: TOPICAL_CREAM | Freq: Two times a day (BID) | CUTANEOUS | Status: DC

## 2015-03-19 NOTE — Progress Notes (Signed)
Name: Darius Grimes   MRN: [id number]    DOB: 15-Nov-1945   Date:03/19/2015       Progress Note  Subjective  Chief Complaint  Chief Complaint  Patient presents with  . Rash    had triple bypass left hospital on friday and has broke out in a rash on the right side under the armpit. Its red and itchy and he has been putting neosporin on area    HPI  Rash: developed suddenly over the past 24 hours, it was itchy initially, no oozing, it is red, non -tender, improved with Neosporin topically, but did not apply it this am. Rash has started to fade, it is on right flank, sparing axilla.   S/p CABG on July 7th, 8828 : no complication from surgery, doing respiratory therapy at home, walking daily, taking medications as prescribed, no fever, incision in good aspect  Patient Active Problem List   Diagnosis Date Noted  . Allergic rhinitis 03/19/2015  . AF (paroxysmal atrial fibrillation) 03/19/2015  . Acid reflux 03/19/2015  . Dyslipidemia 03/19/2015  . Benign hypertension 03/19/2015  . Cervical radiculitis 03/19/2015  . Calculus of kidney 03/19/2015  . S/P coronary artery bypass graft x 3 03/06/2015    History  Substance Use Topics  . Smoking status: Former Smoker -- 1.00 packs/day for 7 years    Types: Cigarettes    Quit date: 08/31/1983  . Smokeless tobacco: Never Used  . Alcohol Use: No     Current outpatient prescriptions:  .  amLODipine (NORVASC) 10 MG tablet, Take 10 mg by mouth every morning., Disp: , Rfl:  .  aspirin 81 MG tablet, Take by mouth., Disp: , Rfl:  .  B Complex-C (SUPER B COMPLEX PO), Take 1 tablet by mouth every morning., Disp: , Rfl:  .  docusate sodium (COLACE) 50 MG capsule, Take 50 mg by mouth 2 (two) times daily., Disp: , Rfl:  .  finasteride (PROSCAR) 5 MG tablet, Take 5 mg by mouth daily., Disp: , Rfl:  .  furosemide (LASIX) 40 MG tablet, Take 40 mg by mouth 2 (two) times daily., Disp: , Rfl:  .  glucosamine-chondroitin 500-400 MG tablet, Take 1 tablet  by mouth 3 (three) times daily., Disp: , Rfl:  .  HYDROcodone-acetaminophen (NORCO) 10-325 MG per tablet, Take 1 tablet by mouth every 4 (four) hours as needed., Disp: , Rfl:  .  ibuprofen (ADVIL,MOTRIN) 800 MG tablet, Take 800 mg by mouth 2 (two) times daily as needed for moderate pain. , Disp: , Rfl:  .  lisinopril (PRINIVIL,ZESTRIL) 10 MG tablet, Take 10 mg by mouth daily., Disp: , Rfl:  .  loratadine (CLARITIN) 10 MG tablet, Take 10 mg by mouth daily., Disp: , Rfl:  .  metoprolol tartrate (LOPRESSOR) 25 MG tablet, Take 25 mg by mouth 2 (two) times daily., Disp: , Rfl:  .  Misc Natural Products (NF FORMULAS TESTOSTERONE PO), Take 1 capsule by mouth every morning. DRUG NAME: TEST X180/Testosterone Booster  CONTAINS: Vitamin D 200IU, Vitamin B6 2mg , Vitamin B12 176mcg, Growth Enhancement Matrix 400mg  (Includes: Testofen, [place] Ginseng, Panax Ginseng, Codyceps sinesis,Tribulus terrestris, Disp: , Rfl:  .  Multiple Vitamin (MULTIVITAMIN WITH MINERALS) TABS, Take 1 tablet by mouth every morning., Disp: , Rfl:  .  potassium chloride (KLOR-CON) 8 MEQ tablet, Take 2 mEq by mouth 2 (two) times daily., Disp: , Rfl:  .  pravastatin (PRAVACHOL) 40 MG tablet, Take 20 mg by mouth daily., Disp: , Rfl:  .  ranitidine (  ZANTAC) 150 MG tablet, Take 150 mg by mouth daily., Disp: , Rfl:  .  pimecrolimus (ELIDEL) 1 % cream, Apply topically 2 (two) times daily., Disp: 30 g, Rfl: 0  No Known Allergies  ROS  Constitutional: Negative for fever or weight change.  Respiratory: Negative for cough and shortness of breath.   Cardiovascular:  Ore on r chest pain from recent bypass surgery palpitations.  Gastrointestinal: Negative for abdominal pain, no bowel changes.  Musculoskeletal: Negative for gait problem or joint swelling.  Skin: positive  for rash.  Neurological: Negative for dizziness or headache.  No other specific complaints in a complete review of systems (except as listed in HPI  above).  Objective  Filed Vitals:   03/19/15 1106  BP: 142/68  Pulse: 71  Temp: 98 F (36.7 C)  TempSrc: Oral  Resp: 18  Height: 5' 8.5" (1.74 m)  Weight: 254 lb 11.2 oz (115.531 kg)  SpO2: 96%    Body mass index is 38.16 kg/(m^2).    Physical Exam  Constitutional: Patient appears well-developed and well-nourished. Obese No distress.  Eyes:  No scleral icterus.  Neck: Normal range of motion. Neck supple. Cardiovascular: Normal rate, regular rhythm and normal heart sounds.  No murmur heard.Positive for 3 plus  On right and 2 plus of left BLE edema. Pulmonary/Chest: Effort normal and breath sounds normal. No respiratory distress. Abdominal: Soft.  There is no tenderness. Psychiatric: Patient has a normal mood and affect. behavior is normal. Judgment and thought content normal. Skin: erythematous rash on right flank, sparring axilla, dry, with some satellite lesions,  Assessment & Plan  1. Rash, skin We will try treating with Elidel, but may be fungal, call back if no improvement, we will avoid steroid creams because recent surgery, can also take loratadine if needed for itching. Eczema versus contact dermatitis - wore a new t-shirt -versus heat rash. Keep arms open to ventilate - pimecrolimus (ELIDEL) 1 % cream; Apply topically 2 (two) times daily.  Dispense: 30 g; Refill: 0  2. S/P coronary artery bypass graft x 3 Continue follow with cardiologist for the [place]  3. Bilateral edema of lower extremity Had vein harvest from right leg, apply in am remove at night - Elastic Bandages & Supports (MEDICAL COMPRESSION STOCKINGS) MISC; 2 Units by Does not apply route daily.  Dispense: 2 each; Refill: 1

## 2015-04-26 ENCOUNTER — Emergency Department (HOSPITAL_COMMUNITY)
Admission: EM | Admit: 2015-04-26 | Discharge: 2015-04-27 | Disposition: A | Discharge status: Home / Self Care | Payer: Medicare Other | Attending: Emergency Medicine | Admitting: Emergency Medicine

## 2015-04-26 ENCOUNTER — Encounter (HOSPITAL_COMMUNITY): Payer: Self-pay | Admitting: Emergency Medicine

## 2015-04-26 DIAGNOSIS — Z79899 Other long term (current) drug therapy: Secondary | ICD-10-CM | POA: Diagnosis not present

## 2015-04-26 DIAGNOSIS — I480 Paroxysmal atrial fibrillation: Secondary | ICD-10-CM | POA: Diagnosis not present

## 2015-04-26 DIAGNOSIS — Z7982 Long term (current) use of aspirin: Secondary | ICD-10-CM | POA: Diagnosis not present

## 2015-04-26 DIAGNOSIS — J029 Acute pharyngitis, unspecified: Secondary | ICD-10-CM | POA: Diagnosis not present

## 2015-04-26 DIAGNOSIS — K219 Gastro-esophageal reflux disease without esophagitis: Secondary | ICD-10-CM | POA: Diagnosis not present

## 2015-04-26 DIAGNOSIS — Z872 Personal history of diseases of the skin and subcutaneous tissue: Secondary | ICD-10-CM | POA: Insufficient documentation

## 2015-04-26 DIAGNOSIS — Z87442 Personal history of urinary calculi: Secondary | ICD-10-CM | POA: Diagnosis not present

## 2015-04-26 DIAGNOSIS — E785 Hyperlipidemia, unspecified: Secondary | ICD-10-CM | POA: Diagnosis not present

## 2015-04-26 DIAGNOSIS — I10 Essential (primary) hypertension: Secondary | ICD-10-CM | POA: Insufficient documentation

## 2015-04-26 DIAGNOSIS — Z87891 Personal history of nicotine dependence: Secondary | ICD-10-CM | POA: Insufficient documentation

## 2015-04-26 LAB — RAPID STREP SCREEN (MED CTR MEBANE ONLY): STREPTOCOCCUS, GROUP A SCREEN (DIRECT): NEGATIVE

## 2015-04-26 MED ORDER — BENZOCAINE 20 % MT SOLN
Freq: Once | OROMUCOSAL | Status: AC
  Administered 2015-04-26: via OROMUCOSAL

## 2015-04-26 MED ORDER — BENZOCAINE 20 % MT SOLN
OROMUCOSAL | Status: AC
  Filled 2015-04-26: qty 57

## 2015-04-26 NOTE — ED Notes (Signed)
Pt reports sore throat x 3 days, was seen at Urgent Care in [facility] yesterday. Pt reports difficulty swallowing and eating. Pt had CABG July 8th.

## 2015-04-26 NOTE — ED Notes (Signed)
Pt c/o sore throat for past 3 days.  Was seen yesterday at an Urgent Care center.

## 2015-04-27 MED ORDER — HYDROCODONE-ACETAMINOPHEN 7.5-325 MG/15ML PO SOLN: 10.0000 mL | ORAL | Status: DC | PRN

## 2015-04-27 NOTE — Discharge Instructions (Signed)

## 2015-04-29 LAB — CULTURE, GROUP A STREP

## 2015-04-30 NOTE — ED Provider Notes (Signed)
CSN: [id number]     Arrival date & time 04/26/15  2224 History   First MD Initiated Contact with Patient 04/26/15 2248     Chief Complaint  Patient presents with  . Sore Throat     (Consider location/radiation/quality/duration/timing/severity/associated sxs/prior Treatment) HPI   Darius Grimes is a 69 y.o. male who presents to the Emergency Department complaining of sore throat for 3 days.  He states that he was seen at a urgent care one day ago for same and states that he was given a mouthwash to use that has not improved his symptoms.  He complains of sharp pain to his throat with swallowing.  He states he is unable to eat solid foods without severe pain.  He denies fever, neck pain, facial swelling, ear pain, or new medications.   Past Medical History  Diagnosis Date  . Hypertension   . Heart palpitations   . Acid reflux   . Kidney stones   . Recurrent nephrolithiasis   . Neck pain   . Skin lesion of back   . Allergic rhinitis   . GERD (gastroesophageal reflux disease)   . Episodic atrial fibrillation   . Hyperlipidemia    Past Surgical History  Procedure Laterality Date  . Throat surgery    . Prostate surgery    . Carpal tunnel release Left   . Ureteral stent placement      at [facility]  . Hydrocele excision / repair    . Vocal cord poylp removal     Family History  Problem Relation Age of Onset  . CVA Mother   . Hypotension Mother   . CAD Father   . Heart attack Father    Social History  Substance Use Topics  . Smoking status: Former Smoker -- 1.00 packs/day for 7 years    Types: Cigarettes    Quit date: 08/31/1983  . Smokeless tobacco: Never Used  . Alcohol Use: No    Review of Systems  Constitutional: Negative for fever, chills, activity change and appetite change.  HENT: Positive for sore throat and trouble swallowing. Negative for congestion, ear pain, facial swelling and voice change.   Eyes: Negative for pain and visual disturbance.  Respiratory:  Negative for cough and shortness of breath.   Cardiovascular: Negative for chest pain.  Gastrointestinal: Negative for nausea, vomiting and abdominal pain.  Musculoskeletal: Negative for arthralgias, neck pain and neck stiffness.  Skin: Negative for color change and rash.  Neurological: Negative for dizziness, facial asymmetry, speech difficulty, numbness and headaches.  Hematological: Negative for adenopathy.  All other systems reviewed and are negative.     Allergies  Review of patient's allergies indicates no known allergies.  Home Medications   Prior to Admission medications   Medication Sig Start Date End Date Taking? Authorizing Provider  aspirin 81 MG tablet Take by mouth.   Yes Historical Provider, MD  b complex vitamins tablet Take 1 tablet by mouth daily.   Yes Historical Provider, MD  docusate sodium (COLACE) 50 MG capsule Take 50 mg by mouth 2 (two) times daily.   Yes Historical Provider, MD  Multiple Vitamin (MULTIVITAMIN WITH MINERALS) TABS tablet Take 1 tablet by mouth daily.   Yes Historical Provider, MD  pimecrolimus (ELIDEL) 1 % cream Apply topically 2 (two) times daily. 03/19/15  Yes Steele Sizer, MD  amLODipine (NORVASC) 10 MG tablet Take 10 mg by mouth every morning.    Historical Provider, MD  Elastic Bandages & Supports (MEDICAL COMPRESSION  STOCKINGS) MISC 2 Units by Does not apply route daily. 03/19/15   Steele Sizer, MD  finasteride (PROSCAR) 5 MG tablet Take 5 mg by mouth daily.    Historical Provider, MD  HYDROcodone-acetaminophen (HYCET) 7.5-325 mg/15 ml solution Take 10 mLs by mouth every 4 (four) hours as needed for moderate pain. 04/26/15   Darril Patriarca, PA-C  ibuprofen (ADVIL,MOTRIN) 800 MG tablet Take 800 mg by mouth 2 (two) times daily as needed for moderate pain.     Historical Provider, MD  lisinopril (PRINIVIL,ZESTRIL) 10 MG tablet Take 10 mg by mouth daily.    Historical Provider, MD  loratadine (CLARITIN) 10 MG tablet Take 10 mg by mouth daily.     Historical Provider, MD  metoprolol tartrate (LOPRESSOR) 25 MG tablet Take 25 mg by mouth 2 (two) times daily.    Historical Provider, MD  potassium chloride (KLOR-CON) 8 MEQ tablet Take 2 mEq by mouth 2 (two) times daily.    Historical Provider, MD  pravastatin (PRAVACHOL) 40 MG tablet Take 20 mg by mouth daily.    Historical Provider, MD  ranitidine (ZANTAC) 150 MG tablet Take 150 mg by mouth daily.    Historical Provider, MD   BP 168/81 mmHg  Pulse 69  Temp(Src) 98.5 F (36.9 C) (Oral)  Resp 16  Ht 5\' 11"  (1.803 m)  Wt 244 lb (110.678 kg)  BMI 34.05 kg/m2  SpO2 96% Physical Exam  Constitutional: He is oriented to person, place, and time. He appears well-developed and well-nourished. No distress.  HENT:  Head: Normocephalic and atraumatic.  Right Ear: Tympanic membrane and ear canal normal.  Left Ear: Tympanic membrane and ear canal normal.  Mouth/Throat: Uvula is midline and mucous membranes are normal. Oral lesions present. No trismus in the jaw. No uvula swelling. Posterior oropharyngeal edema and posterior oropharyngeal erythema present. No oropharyngeal exudate or tonsillar abscesses.  Several erythematous , ulcerations to the soft palate and bilateral tonsils.  Airway is patent, no edema.    Neck: Normal range of motion. Neck supple.  Cardiovascular: Normal rate, regular rhythm and normal heart sounds.   Pulmonary/Chest: Effort normal and breath sounds normal.  Abdominal: There is no splenomegaly. There is no tenderness.  Musculoskeletal: Normal range of motion.  Lymphadenopathy:    He has no cervical adenopathy.  Neurological: He is alert and oriented to person, place, and time. He exhibits normal muscle tone. Coordination normal.  Skin: Skin is warm and dry.  Nursing note and vitals reviewed.   ED Course  Procedures (including critical care time) Labs Review Labs Reviewed  CULTURE, GROUP A STREP - Abnormal; Notable for the following:    Strep A Culture Comment (*)     All other components within normal limits  RAPID STREP SCREEN (NOT AT [facility])     EKG Interpretation None      MDM   Final diagnoses:  Viral pharyngitis    Pt is well appearing, non-toxic.  Airway is patent.  Vitals stable.  Pt agrees to close PMD f/u.  Strep screen neg.      Kem Parkinson, PA-C 04/30/15 1441  Nat Christen, MD 05/06/15 1233

## 2015-05-02 ENCOUNTER — Telehealth (HOSPITAL_COMMUNITY): Payer: Self-pay

## 2015-05-02 NOTE — Telephone Encounter (Signed)
Pt called for throat culture results.  Group A Strep cx results discussed w/Chris Lawyer PA call in "Amoxicillin 875 mg, 1 po TID x 7 days, qs no refills" Pt informed and Rx called to [facility] in [place] and given to th RPh.

## 2015-07-18 ENCOUNTER — Encounter: Payer: Self-pay | Admitting: Family Medicine

## 2015-07-18 ENCOUNTER — Ambulatory Visit (INDEPENDENT_AMBULATORY_CARE_PROVIDER_SITE_OTHER): Payer: Medicare Other | Admitting: Family Medicine

## 2015-07-18 ENCOUNTER — Ambulatory Visit
Admission: RE | Admit: 2015-07-18 | Discharge: 2015-07-18 | Disposition: A | Discharge status: Home / Self Care | Payer: Medicare Other | Source: Ambulatory Visit | Attending: Family Medicine | Admitting: Family Medicine

## 2015-07-18 VITALS — BP 150/68 | HR 73 | Temp 98.3°F | Resp 16 | Wt 250.8 lb

## 2015-07-18 DIAGNOSIS — I10 Essential (primary) hypertension: Secondary | ICD-10-CM

## 2015-07-18 DIAGNOSIS — J40 Bronchitis, not specified as acute or chronic: Secondary | ICD-10-CM | POA: Insufficient documentation

## 2015-07-18 DIAGNOSIS — I480 Paroxysmal atrial fibrillation: Secondary | ICD-10-CM

## 2015-07-18 DIAGNOSIS — J209 Acute bronchitis, unspecified: Secondary | ICD-10-CM

## 2015-07-18 MED ORDER — PREDNISONE 10 MG (21) PO TBPK: ORAL_TABLET | ORAL | Status: DC

## 2015-07-18 MED ORDER — AMOXICILLIN-POT CLAVULANATE 875-125 MG PO TABS: 1.0000 | ORAL_TABLET | Freq: Two times a day (BID) | ORAL | Status: DC

## 2015-07-18 MED ORDER — BENZONATATE 200 MG PO CAPS: 200.0000 mg | ORAL_CAPSULE | Freq: Three times a day (TID) | ORAL | Status: DC | PRN

## 2015-07-18 NOTE — Patient Instructions (Signed)

## 2015-07-18 NOTE — Progress Notes (Signed)
Name: ALEKSANDER EAST   MRN: [id number]    DOB: 05-17-1946   Date:07/18/2015       Progress Note  Subjective  Chief Complaint  Chief Complaint  Patient presents with  . Bronchitis    patient stated that he has a deep productive cough since Sunday.    HPI  Patient is here today with concerns regarding the following symptoms productive cough, achiness and low grade fevers that started 5-6 days ago. Associated with fevers and malaise. Has tried the following home remedies: Advil and the generic form of Mucinex DM. His blood pressure is also elevated today. For HTN he is prescribed Lisinopril 40 mg and amlodipine 5 mg po bid and Lopressor 12.5 mg po bid and furosemide 40 mg in the am and 20 mg in the pm.   Past Medical History  Diagnosis Date  . Hypertension   . Heart palpitations   . Acid reflux   . Kidney stones   . Recurrent nephrolithiasis   . Neck pain   . Skin lesion of back   . Allergic rhinitis   . GERD (gastroesophageal reflux disease)   . Episodic atrial fibrillation ([place])   . Hyperlipidemia     Social History  Substance Use Topics  . Smoking status: Former Smoker -- 1.00 packs/day for 7 years    Types: Cigarettes    Quit date: 08/31/1983  . Smokeless tobacco: Never Used  . Alcohol Use: No     Current outpatient prescriptions:  .  amLODipine (NORVASC) 5 MG tablet, Take 5 mg by mouth 2 (two) times daily., Disp: , Rfl:  .  Cyanocobalamin (VITAMIN B 12 PO), Take by mouth., Disp: , Rfl:  .  lisinopril (PRINIVIL,ZESTRIL) 40 MG tablet, Take 40 mg by mouth daily., Disp: , Rfl:  .  metoprolol tartrate (LOPRESSOR) 12.5 mg TABS tablet, Take 12.5 mg by mouth 2 (two) times daily., Disp: , Rfl:  .  Pyridoxine HCl (VITAMIN B-6 PO), Take by mouth., Disp: , Rfl:  .  aspirin 81 MG tablet, Take by mouth., Disp: , Rfl:  .  docusate sodium (COLACE) 50 MG capsule, Take 50 mg by mouth 2 (two) times daily., Disp: , Rfl:  .  Elastic Bandages & Supports (MEDICAL COMPRESSION STOCKINGS)  MISC, 2 Units by Does not apply route daily., Disp: 2 each, Rfl: 1 .  finasteride (PROSCAR) 5 MG tablet, Take 5 mg by mouth daily., Disp: , Rfl:  .  HYDROcodone-acetaminophen (HYCET) 7.5-325 mg/15 ml solution, Take 10 mLs by mouth every 4 (four) hours as needed for moderate pain., Disp: 100 mL, Rfl: 0 .  ibuprofen (ADVIL,MOTRIN) 800 MG tablet, Take 800 mg by mouth 2 (two) times daily as needed for moderate pain. , Disp: , Rfl:  .  loratadine (CLARITIN) 10 MG tablet, Take 10 mg by mouth daily., Disp: , Rfl:  .  Multiple Vitamin (MULTIVITAMIN WITH MINERALS) TABS tablet, Take 1 tablet by mouth daily., Disp: , Rfl:  .  pimecrolimus (ELIDEL) 1 % cream, Apply topically 2 (two) times daily., Disp: 30 g, Rfl: 0 .  potassium chloride (KLOR-CON) 8 MEQ tablet, Take 2 mEq by mouth 2 (two) times daily., Disp: , Rfl:  .  pravastatin (PRAVACHOL) 40 MG tablet, Take 20 mg by mouth daily., Disp: , Rfl:  .  ranitidine (ZANTAC) 150 MG tablet, Take 150 mg by mouth daily., Disp: , Rfl:   No Known Allergies  ROS  Positive for fatigue, nasal congestion, sinus pressure, ear fullness, cough as mentioned  in HPI, otherwise all systems reviewed and are negative.  Objective  Filed Vitals:   07/18/15 1054  BP: 150/68  Pulse: 73  Temp: 98.3 F (36.8 C)  TempSrc: Oral  Resp: 16  Weight: 250 lb 12.8 oz (113.762 kg)  SpO2: 94%   Body mass index is 34.99 kg/(m^2).   Physical Exam  Constitutional: Patient is obese and well-nourished. In no acute distress but does appear to be fatigued from acute illness. HEENT:  - Head: Normocephalic and atraumatic.  - Ears: RIGHT TM bulging with minimal clear exudate, LEFT TM bulging with minimal clear exudate.  - Nose: Nasal mucosa boggy and congested.  - Mouth/Throat: Oropharynx is moist with slight erythema of bilateral tonsils without hypertrophy or exudates. Post nasal drainage present.  - Eyes: Conjunctivae clear, EOM movements normal. PERRLA. No scleral icterus.  Neck:  Normal range of motion. Neck supple. No JVD present. No thyromegaly present. No local lymphadenopathy. Cardiovascular: Regular rate, regular rhythm with no murmurs heard.  Pulmonary/Chest: Effort normal and good tidal volume with some rhonchi upper anterior airway. Musculoskeletal: Normal range of motion bilateral UE and LE, no joint effusions. Skin: Skin is warm and dry. No rash noted. Psychiatric: Patient has a normal mood and affect. Behavior is normal in office today. Judgment and thought content normal in office today.   Assessment & Plan  1. Bronchitis with bronchospasm Deep coughing on exam, likely due to bronchial inflammation: plan to cover patient with Augmentin and Prednisone taper pack. Instructed patient on increasing hydration, nasal saline spray, steam inhalation, NSAID if tolerated and not contraindicated. Will get CXR to rule out PNA.    - amoxicillin-clavulanate (AUGMENTIN) 875-125 MG tablet; Take 1 tablet by mouth 2 (two) times daily.  Dispense: 20 tablet; Refill: 0 - predniSONE (STERAPRED UNI-PAK 21 TAB) 10 MG (21) TBPK tablet; Use as directed in a 6 day taper PredPak  Dispense: 21 tablet; Refill: 0 - DG Chest 2 View; Future - benzonatate (TESSALON) 200 MG capsule; Take 1 capsule (200 mg total) by mouth 3 (three) times daily as needed for cough.  Dispense: 30 capsule; Refill: 0  2. AF (paroxysmal atrial fibrillation) ([place]) Has appointment with Cardiologist in 2 weeks. Rate controled today on exam despite respiratory symptoms.  3. Hypertension goal BP (blood pressure) < 140/90 Borderline today. May be due to OTC medication he is using.  E

## 2015-08-19 ENCOUNTER — Ambulatory Visit (INDEPENDENT_AMBULATORY_CARE_PROVIDER_SITE_OTHER): Payer: Medicare Other | Admitting: Family Medicine

## 2015-08-19 ENCOUNTER — Ambulatory Visit
Admission: RE | Admit: 2015-08-19 | Discharge: 2015-08-19 | Disposition: A | Discharge status: Home / Self Care | Payer: Medicare Other | Source: Ambulatory Visit | Attending: Family Medicine | Admitting: Family Medicine

## 2015-08-19 ENCOUNTER — Encounter: Payer: Self-pay | Admitting: Family Medicine

## 2015-08-19 ENCOUNTER — Other Ambulatory Visit: Payer: Self-pay | Admitting: Family Medicine

## 2015-08-19 ENCOUNTER — Ambulatory Visit: Payer: Medicare Other | Admitting: Family Medicine

## 2015-08-19 VITALS — BP 128/68 | HR 70 | Temp 98.0°F | Resp 16 | Ht 60.0 in | Wt 256.4 lb

## 2015-08-19 DIAGNOSIS — R9389 Abnormal findings on diagnostic imaging of other specified body structures: Secondary | ICD-10-CM

## 2015-08-19 DIAGNOSIS — Z974 Presence of external hearing-aid: Secondary | ICD-10-CM | POA: Diagnosis not present

## 2015-08-19 DIAGNOSIS — Z951 Presence of aortocoronary bypass graft: Secondary | ICD-10-CM | POA: Diagnosis not present

## 2015-08-19 DIAGNOSIS — I10 Essential (primary) hypertension: Secondary | ICD-10-CM

## 2015-08-19 DIAGNOSIS — R938 Abnormal findings on diagnostic imaging of other specified body structures: Secondary | ICD-10-CM

## 2015-08-19 DIAGNOSIS — R918 Other nonspecific abnormal finding of lung field: Secondary | ICD-10-CM | POA: Diagnosis not present

## 2015-08-19 DIAGNOSIS — R05 Cough: Secondary | ICD-10-CM | POA: Insufficient documentation

## 2015-08-19 DIAGNOSIS — R059 Cough, unspecified: Secondary | ICD-10-CM

## 2015-08-19 MED ORDER — BENZONATATE 100 MG PO CAPS: 100.0000 mg | ORAL_CAPSULE | Freq: Two times a day (BID) | ORAL | Status: DC | PRN

## 2015-08-19 MED ORDER — AMOXICILLIN-POT CLAVULANATE 875-125 MG PO TABS: 1.0000 | ORAL_TABLET | Freq: Two times a day (BID) | ORAL | Status: DC

## 2015-08-19 NOTE — Progress Notes (Signed)
Name: Darius Grimes   MRN: [id number]    DOB: [birth year]   Date:08/19/2015       Progress Note  Subjective  Chief Complaint  Chief Complaint  Patient presents with  . URI    onset 16 weeks s/p heart surgery.  Patient states had a deep viral infection. Symptoms include: hoarsness, cough,sweating and fever and not going away.  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  Patient is very concerned since he is staying sick.    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 He was treated for bronchitis twice since surgery. Treated in August and November. He states that since last visit in November he felt good for about one week, but is having hoarseness, productive cough and subjective fever. No chills, fatigue or weight loss.   Abnormal CXR: 11/18 we will repeat it today  Patient Active Problem List   Diagnosis Date Noted  . Wears hearing aid 08/19/2015  . Bronchitis with bronchospasm 07/18/2015  . Allergic rhinitis 03/19/2015  . AF (paroxysmal atrial fibrillation) ([place]) 03/19/2015  . Acid reflux 03/19/2015  . Dyslipidemia 03/19/2015  . Hypertension goal BP (blood pressure) < 140/90 03/19/2015  . Cervical radiculitis 03/19/2015  . Calculus of kidney 03/19/2015  . S/P coronary artery bypass graft x 3 03/06/2015    Past Surgical History  Procedure Laterality Date  . Throat surgery    . Prostate surgery    . Carpal tunnel release Left   . Ureteral stent placement      at the [facility]  . Hydrocele excision / repair    . Vocal cord poylp removal      Family History  Problem Relation Age of Onset  . CVA Mother   . Hypotension Mother   . CAD Father   . Heart attack Father     Social History   Social History   . Marital Status: Married    Spouse Name: N/A  . Number of Children: N/A  . Years of Education: N/A   Occupational History  . Not on file.   Social History Main Topics  . Smoking status: Former Smoker -- 1.00 packs/day for 7 years    Types: Cigarettes    Quit date: 08/31/1983  . Smokeless tobacco: Never Used  . Alcohol Use: No  . Drug Use: No  . Sexual Activity: Yes   Other Topics Concern  . Not on file   Social History Narrative     Current outpatient prescriptions:  .  amLODipine (NORVASC) 5 MG tablet, Take 5 mg by mouth 2 (two) times daily., Disp: , Rfl:  .  amoxicillin-clavulanate (AUGMENTIN) 875-125 MG tablet, Take 1 tablet by mouth 2 (two) times daily., Disp: 20 tablet, Rfl: 0 .  aspirin 81 MG tablet, Take by mouth., Disp: , Rfl:  .  benzonatate (TESSALON) 100 MG capsule, Take 1-2 capsules (100-200 mg total) by mouth 2 (two) times daily as needed for cough., Disp: 40 capsule, Rfl: 0 .  Cyanocobalamin (VITAMIN B 12 PO), Take by mouth., Disp: , Rfl:  .  docusate sodium (COLACE) 50 MG capsule, Take 50 mg by mouth 2 (two) times daily., Disp: , Rfl:  .  Elastic Bandages &  Supports (MEDICAL COMPRESSION STOCKINGS) MISC, 2 Units by Does not apply route daily., Disp: 2 each, Rfl: 1 .  finasteride (PROSCAR) 5 MG tablet, Take 5 mg by mouth daily., Disp: , Rfl:  .  HYDROcodone-acetaminophen (HYCET) 7.5-325 mg/15 ml solution, Take 10 mLs by mouth every 4 (four) hours as needed for moderate pain., Disp: 100 mL, Rfl: 0 .  lisinopril (PRINIVIL,ZESTRIL) 40 MG tablet, Take 40 mg by mouth daily., Disp: , Rfl:  .  metoprolol tartrate (LOPRESSOR) 12.5 mg TABS tablet, Take 12.5 mg by mouth 2 (two) times daily., Disp: , Rfl:  .  Multiple Vitamin (MULTIVITAMIN WITH MINERALS) TABS tablet, Take 1 tablet by mouth daily., Disp: , Rfl:  .  pimecrolimus (ELIDEL) 1 % cream, Apply topically 2 (two) times daily., Disp: 30 g, Rfl: 0 .  potassium chloride (KLOR-CON) 8 MEQ tablet, Take 2 mEq by mouth 2  (two) times daily., Disp: , Rfl:  .  pravastatin (PRAVACHOL) 40 MG tablet, Take 20 mg by mouth daily., Disp: , Rfl:  .  Pyridoxine HCl (VITAMIN B-6 PO), Take by mouth., Disp: , Rfl:  .  ranitidine (ZANTAC) 150 MG tablet, Take 150 mg by mouth daily., Disp: , Rfl:   No Known Allergies   ROS  Ten systems reviewed and is negative except as mentioned in HPI  Some chest wall pain last week, after some physical activity ( lifting ) last week but resolved  Objective  Filed Vitals:   08/19/15 0907 08/19/15 1002  BP: 160/84 128/68  Pulse: 70   Temp: 98 F (36.7 C)   TempSrc: Oral   Resp: 16   Height: 5' (1.524 m)   Weight: 256 lb 6.4 oz (116.302 kg)   SpO2: 93%     Body mass index is 50.07 kg/(m^2).  Physical Exam  Constitutional: Patient appears well-developed and well-nourished. Obese No distress.  HEENT: head atraumatic, normocephalic, pupils equal and reactive to light, ears TM bilaterally scarring worse on left side neck supple, throat within normal limits Cardiovascular: Normal rate, regular rhythm and normal heart sounds.  No murmur heard. No BLE edema. Pulmonary/Chest: Effort normal and breath sounds normal. No respiratory distress. Abdominal: Soft.  There is no tenderness. Psychiatric: Patient has a normal mood and affect. behavior is normal. Judgment and thought content normal.   PHQ2/9: Depression screen [facility] 2/9 07/18/2015 03/19/2015  Decreased Interest 0 0  Down, Depressed, Hopeless 1 0  PHQ - 2 Score 1 0    Fall Risk: Fall Risk  07/18/2015 03/19/2015  Falls in the past year? No No    Assessment & Plan   1. Cough  - amoxicillin-clavulanate (AUGMENTIN) 875-125 MG tablet; Take 1 tablet by mouth 2 (two) times daily.  Dispense: 20 tablet; Refill: 0 - M. Tuberculosis complex by PCR Benzonate 100 -200 mg up to three times daily   2. History of heart bypass surgery  - M. Tuberculosis complex by PCR  3. Abnormal CXR  - M. Tuberculosis complex by PCR - DG  Chest 2 View; Future  4. Wears hearing aid  Hearing loss  5. Hypertension goal BP (blood pressure) < 140/90   BP usually at goal at home, elevated today. Recently seen by [facility]ologist at the [facility] and bp was also good. Denies chest pain or palpitation at this time  - amoxicillin-clavulanate (AUGMENTIN) 875-125 MG tablet; Take 1 tablet by mouth 2 (two) times daily.  Dispense: 20 tablet; Refill: 0

## 2015-08-21 LAB — MTB NAA WITHOUT AFB CULTURE: M tuberculosis, NAA: NEGATIVE

## 2015-08-31 HISTORY — PX: COLONOSCOPY: SHX174

## 2015-09-26 ENCOUNTER — Encounter: Payer: Self-pay | Admitting: Family Medicine

## 2015-09-26 ENCOUNTER — Ambulatory Visit (INDEPENDENT_AMBULATORY_CARE_PROVIDER_SITE_OTHER): Payer: Medicare Other | Admitting: Family Medicine

## 2015-09-26 VITALS — BP 134/70 | HR 66 | Temp 98.2°F | Resp 12 | Ht 60.0 in | Wt 259.8 lb

## 2015-09-26 DIAGNOSIS — Z125 Encounter for screening for malignant neoplasm of prostate: Secondary | ICD-10-CM

## 2015-09-26 DIAGNOSIS — Z0000 Encounter for general adult medical examination without abnormal findings: Secondary | ICD-10-CM | POA: Diagnosis not present

## 2015-09-26 DIAGNOSIS — H9193 Unspecified hearing loss, bilateral: Secondary | ICD-10-CM | POA: Diagnosis not present

## 2015-09-26 DIAGNOSIS — Z974 Presence of external hearing-aid: Secondary | ICD-10-CM

## 2015-09-26 DIAGNOSIS — Z23 Encounter for immunization: Secondary | ICD-10-CM | POA: Diagnosis not present

## 2015-09-26 DIAGNOSIS — Z79899 Other long term (current) drug therapy: Secondary | ICD-10-CM | POA: Diagnosis not present

## 2015-09-26 DIAGNOSIS — E785 Hyperlipidemia, unspecified: Secondary | ICD-10-CM | POA: Diagnosis not present

## 2015-09-26 NOTE — Progress Notes (Signed)
Name: Darius Grimes   MRN: [id number]    DOB: 1946-05-01   Date:09/26/2015       Progress Note  Subjective  Chief Complaint  Chief Complaint  Patient presents with  . Annual Exam    HPI  Functional ability/safety issues: No Issues Hearing issues: He is supposed to have hearing aids but he lost one of them and the second one broke, he is waiting for the replacement Activities of daily living: Discussed Home safety issues: No Issues  End Of Life Planning: Offered verbal information regarding advanced directives, healthcare power of attorney.  Preventative care, Health maintenance, Preventative health measures discussed.  Preventative screenings discussed today: lab work, colonoscopy, PSA.  Men age 69 to 69 years if ever smoked recommended to get a one time AAA ultrasound screening exam. He had it done prior to CABG in 02/2015  Low Dose CT Chest recommended if Age 18-80 years, 30 pack-year currently smoking OR have quit w/in 15years.   Lifestyle risk factor issued reviewed: Diet, exercise, weight management, advised patient smoking is not healthy, nutrition/diet.  Preventative health measures discussed (5-10 year plan).  Reviewed and recommended vaccinations: - Pneumovax  - Prevnar  - Annual Influenza - Zostavax - Tdap   Depression screening: Done Fall risk screening: Done Discuss ADLs/IADLs: Done  Current medical providers: See HPI  Other health risk factors identified this visit: No other issues Cognitive impairment issues: None identified  All above discussed with patient. Appropriate education, counseling and referral will be made based upon the above.   Hyperlipidemia: he has CAD, s/p CABG, he states he has had labs done at [facility] but not available for review. He states it has been months since last checked. He denies myalgia. No chest pain, he is compliant with medication   Hearing loss : out of hearing aids, but already ordered replacement  Patient Active Problem  List   Diagnosis Date Noted  . Wears hearing aid 08/19/2015  . Bronchitis with bronchospasm 07/18/2015  . Allergic rhinitis 03/19/2015  . AF (paroxysmal atrial fibrillation) ([place]) 03/19/2015  . Acid reflux 03/19/2015  . Dyslipidemia 03/19/2015  . Hypertension goal BP (blood pressure) < 140/90 03/19/2015  . Cervical radiculitis 03/19/2015  . Calculus of kidney 03/19/2015  . S/P coronary artery bypass graft x 3 03/06/2015    Past Surgical History  Procedure Laterality Date  . Throat surgery    . Prostate surgery    . Carpal tunnel release Left   . Ureteral stent placement      at the [facility]  . Hydrocele excision / repair    . Vocal cord poylp removal      Family History  Problem Relation Age of Onset  . CVA Mother   . Hypotension Mother   . CAD Father   . Heart attack Father     Social History   Social History  . Marital Status: Married    Spouse Name: N/A  . Number of Children: N/A  . Years of Education: N/A   Occupational History  . Not on file.   Social History Main Topics  . Smoking status: Former Smoker -- 1.00 packs/day for 7 years    Types: Cigarettes    Quit date: 08/31/1983  . Smokeless tobacco: Never Used  . Alcohol Use: No  . Drug Use: No  . Sexual Activity:    Partners: Female   Other Topics Concern  . Not on file   Social History Narrative     Current outpatient  prescriptions:  .  amLODipine (NORVASC) 5 MG tablet, Take 5 mg by mouth 2 (two) times daily., Disp: , Rfl:  .  aspirin 81 MG tablet, Take by mouth., Disp: , Rfl:  .  Cyanocobalamin (VITAMIN B 12 PO), Take by mouth., Disp: , Rfl:  .  docusate sodium (COLACE) 50 MG capsule, Take 50 mg by mouth 2 (two) times daily., Disp: , Rfl:  .  Elastic Bandages & Supports (MEDICAL COMPRESSION STOCKINGS) MISC, 2 Units by Does not apply route daily., Disp: 2 each, Rfl: 1 .  finasteride (PROSCAR) 5 MG tablet, Take 5 mg by mouth daily., Disp: , Rfl:  .  HYDROcodone-acetaminophen (HYCET) 7.5-325 mg/15 ml  solution, Take 10 mLs by mouth every 4 (four) hours as needed for moderate pain., Disp: 100 mL, Rfl: 0 .  lisinopril (PRINIVIL,ZESTRIL) 40 MG tablet, Take 40 mg by mouth daily., Disp: , Rfl:  .  metoprolol tartrate (LOPRESSOR) 12.5 mg TABS tablet, Take 12.5 mg by mouth 2 (two) times daily., Disp: , Rfl:  .  Multiple Vitamin (MULTIVITAMIN WITH MINERALS) TABS tablet, Take 1 tablet by mouth daily., Disp: , Rfl:  .  pimecrolimus (ELIDEL) 1 % cream, Apply topically 2 (two) times daily., Disp: 30 g, Rfl: 0 .  potassium chloride (KLOR-CON) 8 MEQ tablet, Take 2 mEq by mouth 2 (two) times daily., Disp: , Rfl:  .  pravastatin (PRAVACHOL) 40 MG tablet, Take 20 mg by mouth daily., Disp: , Rfl:  .  Pyridoxine HCl (VITAMIN B-6 PO), Take by mouth., Disp: , Rfl:  .  ranitidine (ZANTAC) 150 MG tablet, Take 150 mg by mouth daily., Disp: , Rfl:   No Known Allergies   ROS  Constitutional: Negative for fever or significant weight change.  Respiratory: Negative for cough and shortness of breath.   Cardiovascular: Negative for chest pain or palpitations.  Gastrointestinal: Negative for abdominal pain, no bowel changes.  Musculoskeletal: Negative for gait problem or joint swelling.  Skin: Negative for rash.  Neurological: Negative for dizziness or headache.  No other specific complaints in a complete review of systems (except as listed in HPI above).  Objective  Filed Vitals:   09/26/15 1419  BP: 134/70  Pulse: 66  Temp: 98.2 F (36.8 C)  TempSrc: Oral  Resp: 12  Height: 5' (1.524 m)  Weight: 259 lb 12.8 oz (117.845 kg)  SpO2: 96%    Body mass index is 50.74 kg/(m^2).  Physical Exam  Constitutional: Patient appears well-developed and obese. No distress.  HENT: Head: Normocephalic and atraumatic. Ears: B TMs ok, no erythema or effusion; Nose: Nose normal. Mouth/Throat: Oropharynx is clear and moist. No oropharyngeal exudate.  Eyes: Conjunctivae and EOM are normal. Pupils are equal, round, and  reactive to light. No scleral icterus.  Neck: Normal range of motion. Neck supple. No JVD present. No thyromegaly present.  Cardiovascular: Normal rate, regular rhythm and normal heart sounds.  No murmur heard. 1 Plus pitting  BLE edema. Pulmonary/Chest: Effort normal and breath sounds normal. No respiratory distress. Abdominal: Soft. Bowel sounds are normal, no distension. There is no tenderness. no masses MALE GENITALIA: Normal descended testes bilaterally, no masses palpated, no hernias, no lesions, no discharge RECTAL: Prostate normal size and consistency, no rectal masses or hemorrhoids Musculoskeletal: Normal range of motion, no joint effusions. No gross deformities Neurological: he is alert and oriented to person, place, and time. No cranial nerve deficit. Coordination, balance, strength, speech and gait are normal.  Skin: Skin is warm and dry. No rash  noted. No erythema. Some stasis dermatitis both legs Psychiatric: Patient has a normal mood and affect. behavior is normal. Judgment and thought content normal.  Recent Results (from the past 2160 hour(s))  MTB NAA Without AFB Culture     Status: None   Collection Time: 08/19/15  8:00 AM  Result Value Ref Range   M tuberculosis, NAA Negative Negative    Comment: Validation studies at [facility] have demonstrated that PCR testing is highly sensitive for the detection of Mycobacterium tuberculosis complex in smear negative or smear positive specimens. PCR results should be used in conjunction with laboratory test results and the patient's clinical profile. Per the College of American Pathologists (CAP) guidelines, a culture should be performed on all samples regardless of the molecular test result. By ordering this test code, the client has assumed responsibility for the performance of the culture. This test was developed and its performance characteristics determined by [facility]. It has not been cleared or approved by the Food and  Drug Administration.       PHQ2/9: Depression screen [facility] 2/9 09/26/2015 07/18/2015 03/19/2015  Decreased Interest 0 0 0  Down, Depressed, Hopeless 0 1 0  PHQ - 2 Score 0 1 0     Fall Risk: Fall Risk  09/26/2015 07/18/2015 03/19/2015  Falls in the past year? No No No      Functional Status Survey: Is the patient deaf or have difficulty hearing?: Yes (wears hearing aids in bilateral ears) Does the patient have difficulty seeing, even when wearing glasses/contacts?: No Does the patient have difficulty concentrating, remembering, or making decisions?: No Does the patient have difficulty walking or climbing stairs?: No Does the patient have difficulty dressing or bathing?: No Does the patient have difficulty doing errands alone such as visiting a doctor's office or shopping?: No    Assessment & Plan  1. Encounter for routine history and physical exam for male  Discussed importance of 150 minutes of physical activity weekly, eat two servings of fish weekly, eat one serving of tree nuts ( cashews, pistachios, pecans, almonds.[name]) every other day, eat 6 servings of fruit/vegetables daily and drink plenty of water and avoid sweet beverages.   2. Hearing loss, bilateral  Needs to get hearing aid replaced  3. Wears hearing aid   4. Dyslipidemia  - Lipid panel  5. Long-term use of high-risk medication  - Comprehensive metabolic panel  6. Prostate cancer screening  - PSA  7. Need for pneumococcal vaccination  - Pneumococcal conjugate vaccine 13-valent IM

## 2015-09-26 NOTE — Patient Instructions (Signed)
  Mr. Darius Grimes , Thank you for taking time to come for your Medicare Wellness Visit. I appreciate your ongoing commitment to your health goals. Please review the following plan we discussed and let me know if I can assist you in the future.    This is a list of the screening recommended for you and due dates:  Health Maintenance  Topic Date Due  . Flu Shot  03/30/2016  . Colon Cancer Screening  08/30/2020  . Tetanus Vaccine  09/25/2025  . Shingles Vaccine  Completed  .  Hepatitis C: One time screening is recommended by Center for Disease Control  (CDC) for  adults born from [birth year] through 1964.   Completed  . Pneumonia vaccines  Completed

## 2016-03-26 ENCOUNTER — Ambulatory Visit: Payer: Medicare Other | Admitting: Family Medicine

## 2016-04-07 ENCOUNTER — Ambulatory Visit: Payer: Medicare Other | Admitting: Family Medicine

## 2016-04-23 ENCOUNTER — Ambulatory Visit (INDEPENDENT_AMBULATORY_CARE_PROVIDER_SITE_OTHER): Payer: Medicare Other | Admitting: Family Medicine

## 2016-04-23 ENCOUNTER — Encounter: Payer: Self-pay | Admitting: Family Medicine

## 2016-04-23 VITALS — BP 122/68 | HR 55 | Temp 98.0°F | Resp 16 | Ht 60.0 in | Wt 243.7 lb

## 2016-04-23 DIAGNOSIS — E785 Hyperlipidemia, unspecified: Secondary | ICD-10-CM | POA: Diagnosis not present

## 2016-04-23 DIAGNOSIS — I480 Paroxysmal atrial fibrillation: Secondary | ICD-10-CM | POA: Diagnosis not present

## 2016-04-23 DIAGNOSIS — I10 Essential (primary) hypertension: Secondary | ICD-10-CM | POA: Diagnosis not present

## 2016-04-23 DIAGNOSIS — Z974 Presence of external hearing-aid: Secondary | ICD-10-CM | POA: Diagnosis not present

## 2016-04-23 DIAGNOSIS — M25561 Pain in right knee: Secondary | ICD-10-CM | POA: Diagnosis not present

## 2016-04-23 DIAGNOSIS — M25562 Pain in left knee: Secondary | ICD-10-CM | POA: Diagnosis not present

## 2016-04-23 MED ORDER — LIDOCAINE HCL (PF) 1 % IJ SOLN
2.0000 mL | Freq: Once | INTRAMUSCULAR | Status: AC
  Administered 2016-04-23: 2 mL via INTRADERMAL

## 2016-04-23 MED ORDER — TRIAMCINOLONE ACETONIDE 40 MG/ML IJ SUSP
40.0000 mg | Freq: Once | INTRAMUSCULAR | Status: AC
  Administered 2016-04-23: 40 mg via INTRAMUSCULAR

## 2016-04-23 NOTE — Progress Notes (Signed)
Name: Darius Grimes   MRN: [id number]    DOB: 06-Sep-1945   Date:04/23/2016       Progress Note  Subjective  Chief Complaint  Chief Complaint  Patient presents with  . Knee Pain    Bilateral Knee pain, Onset for years and would like to request a cortisone shot    HPI  Bilateral knee pain: he has a long history of bilateral knee OA, pain used to be intermittent, however for the past few months pain has been constant on the left knee. Pain is described as aching, but when he stretches the left knee pain is sharp and intense and radiates to left popliteal fossa. Symptoms started at the same time that he started to walk 4 miles 5 days weekly. He wants to continue walking and would like a steroid injections. He also stopped eating junk food, drinking water. He goes to the [place] for his regular medication refills and labs.  HTN: taking medication and denies side effects. BP is at goal. No chest pain but he has noticed palpitation, recently seen at the [facility] and had holter monitor  Afib: holter monitor done, he states he has having some buzzing sensation on his left chest when doing weight lifting, but is doing well since he stopped using the weights  Hyperlipidemia: taking statin therapy and no myalgias. No chest pain, or SOB.  Patient Active Problem List   Diagnosis Date Noted  . Wears hearing aid 08/19/2015  . Bronchitis with bronchospasm 07/18/2015  . Allergic rhinitis 03/19/2015  . AF (paroxysmal atrial fibrillation) ([place]) 03/19/2015  . Acid reflux 03/19/2015  . Dyslipidemia 03/19/2015  . Hypertension goal BP (blood pressure) < 140/90 03/19/2015  . Cervical radiculitis 03/19/2015  . Calculus of kidney 03/19/2015  . S/P coronary artery bypass graft x 3 03/06/2015    Past Surgical History:  Procedure Laterality Date  . CARPAL TUNNEL RELEASE Left   . HYDROCELE EXCISION / REPAIR    . PROSTATE SURGERY    . THROAT SURGERY    . URETERAL STENT PLACEMENT     at the [place]  . Vocal cord poylp  removal      Family History  Problem Relation Age of Onset  . CVA Mother   . Hypotension Mother   . CAD Father   . Heart attack Father     Social History   Social History  . Marital status: Married    Spouse name: N/A  . Number of children: N/A  . Years of education: N/A   Occupational History  . Not on file.   Social History Main Topics  . Smoking status: Former Smoker    Packs/day: 1.00    Years: 7.00    Types: Cigarettes    Quit date: 08/31/1983  . Smokeless tobacco: Never Used  . Alcohol use No  . Drug use: No  . Sexual activity: Yes    Partners: Female   Other Topics Concern  . Not on file   Social History Narrative  . No narrative on file     Current Outpatient Prescriptions:  .  amLODipine (NORVASC) 5 MG tablet, Take 5 mg by mouth 2 (two) times daily., Disp: , Rfl:  .  aspirin 81 MG tablet, Take by mouth., Disp: , Rfl:  .  Cyanocobalamin (VITAMIN B 12 PO), Take by mouth., Disp: , Rfl:  .  docusate sodium (COLACE) 50 MG capsule, Take 50 mg by mouth 2 (two) times daily., Disp: , Rfl:  .  Elastic Bandages & Supports (MEDICAL COMPRESSION STOCKINGS) MISC, 2 Units by Does not apply route daily., Disp: 2 each, Rfl: 1 .  finasteride (PROSCAR) 5 MG tablet, Take 5 mg by mouth daily., Disp: , Rfl:  .  HYDROcodone-acetaminophen (HYCET) 7.5-325 mg/15 ml solution, Take 10 mLs by mouth every 4 (four) hours as needed for moderate pain., Disp: 100 mL, Rfl: 0 .  lisinopril (PRINIVIL,ZESTRIL) 40 MG tablet, Take 40 mg by mouth daily., Disp: , Rfl:  .  metoprolol tartrate (LOPRESSOR) 12.5 mg TABS tablet, Take 12.5 mg by mouth 2 (two) times daily., Disp: , Rfl:  .  Multiple Vitamin (MULTIVITAMIN WITH MINERALS) TABS tablet, Take 1 tablet by mouth daily., Disp: , Rfl:  .  pimecrolimus (ELIDEL) 1 % cream, Apply topically 2 (two) times daily., Disp: 30 g, Rfl: 0 .  potassium chloride (KLOR-CON) 8 MEQ tablet, Take 2 mEq by mouth 2 (two) times daily., Disp: , Rfl:  .  pravastatin  (PRAVACHOL) 40 MG tablet, Take 20 mg by mouth daily., Disp: , Rfl:  .  Pyridoxine HCl (VITAMIN B-6 PO), Take by mouth., Disp: , Rfl:  .  ranitidine (ZANTAC) 150 MG tablet, Take 150 mg by mouth daily., Disp: , Rfl:   No Known Allergies   ROS  Constitutional: Negative for fever, positive for  weight change.  Respiratory: Negative for cough and shortness of breath.   Cardiovascular: Negative for chest pain , positive for intermittent  palpitations.  Gastrointestinal: Negative for abdominal pain, no bowel changes.  Musculoskeletal: Positive for gait problem and mild joint swelling.  Skin: Negative for rash.  Neurological: Negative for dizziness or headache.  No other specific complaints in a complete review of systems (except as listed in HPI above).  Objective  Vitals:   04/23/16 1054  BP: 122/68  Pulse: (!) 55  Resp: 16  Temp: 98 F (36.7 C)  TempSrc: Oral  SpO2: 95%  Weight: 243 lb 11.2 oz (110.5 kg)  Height: 5' (1.524 m)    Body mass index is 47.59 kg/m.  Physical Exam  Constitutional: Patient appears well-developed and well-nourished. Obese  No distress.  HEENT: head atraumatic, normocephalic, pupils equal and reactive to light, ears : hearing aids, neck supple, throat within normal limits Cardiovascular: Normal rate, regular rhythm and normal heart sounds.  No murmur heard. No BLE edema. Pulmonary/Chest: Effort normal and breath sounds normal. No respiratory distress. Abdominal: Soft.  There is no tenderness. Muscular Skeletal: crepitus with extension of both knees, mild effusion left knee, negative  McMurray's, antalgic gait. He states controlled pain when he takes pain medication, but did not take it today Psychiatric: Patient has a normal mood and affect. behavior is normal. Judgment and thought content normal.   PHQ2/9: Depression screen [facility] 2/9 04/23/2016 09/26/2015 07/18/2015 03/19/2015  Decreased Interest 0 0 0 0  Down, Depressed, Hopeless 0 0 1 0  PHQ - 2  Score 0 0 1 0    Fall Risk: Fall Risk  04/23/2016 09/26/2015 07/18/2015 03/19/2015  Falls in the past year? No No No No    Functional Status Survey: Is the patient deaf or have difficulty hearing?: No Does the patient have difficulty seeing, even when wearing glasses/contacts?: No Does the patient have difficulty concentrating, remembering, or making decisions?: No Does the patient have difficulty walking or climbing stairs?: No Does the patient have difficulty dressing or bathing?: No Does the patient have difficulty doing errands alone such as visiting a doctor's office or shopping?: No  Assessment & Plan  1. Bilateral knee pain   Consent signed: YES ( given verbal consent ) Procedure: Knee Joint Injection Location: left knee Injection approach: lateral knee Equipment used: 25 gauge 1.5 inch needle Medication: 2 mL Kenalog (40mg /81mL) Anesthesia: 1% Lidocaine w/o Epinephrine Cleaned and prepped: Betadine  The risks, benefits, treatment options discussed with patient prior to procedure.  Consent signed. Area cleansed with sterile betadine. .    Patient tolerated procedure well with no complications and no bleeding. Bandage placed at site of injection. Patient instructed on potential for steroid reaction pain within the initial 24-48hr period. May use ice packs directly on injected site as needed.  Refer to Ortho if no improvement  2. Dyslipidemia  Continue follow up at the [place]  3. AF (paroxysmal atrial fibrillation) ([place])  [facility], [facility] now, no symptoms recently   4. Hypertension goal BP (blood pressure) < 140/90  Well controlled  5. Wears hearing aid  stable

## 2016-10-01 ENCOUNTER — Encounter: Payer: Medicare Other | Admitting: Family Medicine

## 2016-10-15 ENCOUNTER — Emergency Department (HOSPITAL_COMMUNITY)
Admission: EM | Admit: 2016-10-15 | Discharge: 2016-10-15 | Disposition: A | Discharge status: Home / Self Care | Payer: Medicare HMO | Attending: Emergency Medicine | Admitting: Emergency Medicine

## 2016-10-15 ENCOUNTER — Emergency Department (HOSPITAL_COMMUNITY): Payer: Medicare HMO

## 2016-10-15 ENCOUNTER — Encounter (HOSPITAL_COMMUNITY): Payer: Self-pay

## 2016-10-15 DIAGNOSIS — Z87891 Personal history of nicotine dependence: Secondary | ICD-10-CM | POA: Diagnosis not present

## 2016-10-15 DIAGNOSIS — R509 Fever, unspecified: Secondary | ICD-10-CM | POA: Diagnosis not present

## 2016-10-15 DIAGNOSIS — R062 Wheezing: Secondary | ICD-10-CM | POA: Insufficient documentation

## 2016-10-15 DIAGNOSIS — R05 Cough: Secondary | ICD-10-CM | POA: Diagnosis not present

## 2016-10-15 DIAGNOSIS — R059 Cough, unspecified: Secondary | ICD-10-CM

## 2016-10-15 NOTE — ED Triage Notes (Signed)
Pt c/o persistent cough, occasional wheezing, fevers at home, taking otc meds for same.

## 2016-10-15 NOTE — ED Notes (Signed)
Pt not in treatment room, gown on bed, unable to locate pt

## 2016-10-19 DIAGNOSIS — J208 Acute bronchitis due to other specified organisms: Secondary | ICD-10-CM | POA: Diagnosis not present

## 2016-10-19 DIAGNOSIS — B9689 Other specified bacterial agents as the cause of diseases classified elsewhere: Secondary | ICD-10-CM | POA: Diagnosis not present

## 2016-10-19 DIAGNOSIS — R0989 Other specified symptoms and signs involving the circulatory and respiratory systems: Secondary | ICD-10-CM | POA: Diagnosis not present

## 2016-10-22 ENCOUNTER — Ambulatory Visit (INDEPENDENT_AMBULATORY_CARE_PROVIDER_SITE_OTHER): Payer: Medicare HMO | Admitting: Family Medicine

## 2016-10-22 ENCOUNTER — Encounter: Payer: Self-pay | Admitting: Family Medicine

## 2016-10-22 VITALS — BP 150/86 | HR 58 | Temp 97.6°F | Resp 18 | Ht 69.5 in | Wt 243.6 lb

## 2016-10-22 DIAGNOSIS — Z0000 Encounter for general adult medical examination without abnormal findings: Secondary | ICD-10-CM

## 2016-10-22 DIAGNOSIS — Z23 Encounter for immunization: Secondary | ICD-10-CM

## 2016-10-22 DIAGNOSIS — I10 Essential (primary) hypertension: Secondary | ICD-10-CM | POA: Diagnosis not present

## 2016-10-22 DIAGNOSIS — J209 Acute bronchitis, unspecified: Secondary | ICD-10-CM

## 2016-10-22 DIAGNOSIS — E559 Vitamin D deficiency, unspecified: Secondary | ICD-10-CM

## 2016-10-22 DIAGNOSIS — E785 Hyperlipidemia, unspecified: Secondary | ICD-10-CM

## 2016-10-22 DIAGNOSIS — Z125 Encounter for screening for malignant neoplasm of prostate: Secondary | ICD-10-CM

## 2016-10-22 DIAGNOSIS — Z79899 Other long term (current) drug therapy: Secondary | ICD-10-CM

## 2016-10-22 DIAGNOSIS — N401 Enlarged prostate with lower urinary tract symptoms: Secondary | ICD-10-CM | POA: Diagnosis not present

## 2016-10-22 DIAGNOSIS — R739 Hyperglycemia, unspecified: Secondary | ICD-10-CM | POA: Diagnosis not present

## 2016-10-22 DIAGNOSIS — E538 Deficiency of other specified B group vitamins: Secondary | ICD-10-CM | POA: Diagnosis not present

## 2016-10-22 DIAGNOSIS — Z951 Presence of aortocoronary bypass graft: Secondary | ICD-10-CM | POA: Diagnosis not present

## 2016-10-22 LAB — CBC WITH DIFFERENTIAL/PLATELET
BASOS PCT: 0 %
Basophils Absolute: 0 cells/uL (ref 0–200)
EOS PCT: 2 %
Eosinophils Absolute: 146 cells/uL (ref 15–500)
HCT: 45.6 % (ref 38.5–50.0)
Hemoglobin: 15.7 g/dL (ref 13.2–17.1)
LYMPHS PCT: 26 %
Lymphs Abs: 1898 cells/uL (ref 850–3900)
MCH: 30.3 pg (ref 27.0–33.0)
MCHC: 34.4 g/dL (ref 32.0–36.0)
MCV: 88 fL (ref 80.0–100.0)
MPV: 10.7 fL (ref 7.5–12.5)
Monocytes Absolute: 511 cells/uL (ref 200–950)
Monocytes Relative: 7 %
NEUTROS ABS: 4745 {cells}/uL (ref 1500–7800)
Neutrophils Relative %: 65 %
PLATELETS: 219 10*3/uL (ref 140–400)
RBC: 5.18 MIL/uL (ref 4.20–5.80)
RDW: 13.9 % (ref 11.0–15.0)
WBC: 7.3 10*3/uL (ref 3.8–10.8)

## 2016-10-22 NOTE — Progress Notes (Signed)
Name: Darius Grimes   MRN: [id number]    DOB: 27-Oct-1945   Date:10/22/2016       Progress Note  Subjective  Chief Complaint  Chief Complaint  Patient presents with  . Annual Exam    HPI  Functional ability/safety issues: No Issues Hearing issues: Addressed  Activities of daily living: Discussed Home safety issues: No Issues  End Of Life Planning: Offered verbal information regarding advanced directives, healthcare power of attorney.  Preventative care, Health maintenance, Preventative health measures discussed.  Preventative screenings discussed today: lab work, colonoscopy,  mammogram, DEXA.  Low Dose CT Chest recommended if Age 75-80 years, 30 pack-year currently smoking OR have quit w/in 15years.   Lifestyle risk factor issued reviewed: Diet, exercise, weight management, advised patient smoking is not healthy, nutrition/diet.  Preventative health measures discussed (5-10 year plan).  Reviewed and recommended vaccinations: - Pneumovax  - Prevnar  - Annual Influenza - Zostavax - Tdap - not covered by insurance, he will get it at the [place]  Depression screening: Done Fall risk screening: Done Discuss ADLs/IADLs: Done  Current medical providers: See HPI  Other health risk factors identified this visit: No other issues Cognitive impairment issues: None identified  All above discussed with patient. Appropriate education, counseling and referral will be made based upon the above.   HTN: taking medication and denies side effects. BP is at goal. No chest pain or recent palpitation.  Afib: doing well, no buzzing sensation in his chest, he did not want to start anti-coagulants after CABG  Hyperlipidemia: taking statin therapy and no myalgias. No chest pain, or SOB  CABG: he is taking aspirin, statin and beta-blocker, he denies any chest pain or decrease in exercise tolerance.  Bronchitis: he recently developed cough and went to [facility] but left before he was seen on  10/15/2016, went to Urgent care on 10/19/2016 and was given doxi, benzonate and Ventolin, he is feeling better. Still has some post-nasal drainage and mild cough, but greatly improved.   Obesity: his weight is stable, he is not going to the gym but he has been walking his dog, discussed life style modification  IPSS Questionnaire (AUA-7): Over the past month.   1)  How often have you had a sensation of not emptying your bladder completely after you finish urinating?  5 - Almost always  2)  How often have you had to urinate again less than two hours after you finished urinating? 5 - Almost always  3)  How often have you found you stopped and started again several times when you urinated?  5 - Almost always  4) How difficult have you found it to postpone urination?  3 - About half the time  5) How often have you had a weak urinary stream?  2 - Less than half the time  6) How often have you had to push or strain to begin urination?  3 - About half the time  7) How many times did you most typically get up to urinate from the time you went to bed until the time you got up in the morning?  4 - 4 times  Total score:  0-7 mildly symptomatic   8-19 moderately symptomatic   20-35 severely symptomatic    Patient Active Problem List   Diagnosis Date Noted  . Vitamin D deficiency 10/22/2016  . B12 deficiency 10/22/2016  . Wears hearing aid 08/19/2015  . Bronchitis with bronchospasm 07/18/2015  . Allergic rhinitis 03/19/2015  . AF (paroxysmal  atrial fibrillation) ([place]) 03/19/2015  . Acid reflux 03/19/2015  . Dyslipidemia 03/19/2015  . Hypertension goal BP (blood pressure) < 140/90 03/19/2015  . Cervical radiculitis 03/19/2015  . Calculus of kidney 03/19/2015  . S/P coronary artery bypass graft x 3 03/06/2015    Past Surgical History:  Procedure Laterality Date  . CARPAL TUNNEL RELEASE Left   . HYDROCELE EXCISION / REPAIR    . PROSTATE SURGERY    . THROAT SURGERY    . URETERAL STENT  PLACEMENT     at the [place]  . Vocal cord poylp removal      Family History  Problem Relation Age of Onset  . CVA Mother   . Hypotension Mother   . CAD Father   . Heart attack Father   . Hypertension Sister   . Hypertension Brother   . Obesity Brother     Social History   Social History  . Marital status: Married    Spouse name: N/A  . Number of children: N/A  . Years of education: N/A   Occupational History  . Not on file.   Social History Main Topics  . Smoking status: Former Smoker    Packs/day: 1.00    Years: 7.00    Types: Cigarettes    Quit date: 08/31/1983  . Smokeless tobacco: Never Used  . Alcohol use No  . Drug use: No  . Sexual activity: Yes    Partners: Female   Other Topics Concern  . 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      Current Outpatient Prescriptions:  .  amLODipine (NORVASC) 5 MG tablet, Take 5 mg by mouth 2 (two) times daily., Disp: , Rfl:  .  aspirin 81 MG tablet, Take by mouth., Disp: , Rfl:  .  Cyanocobalamin (VITAMIN B 12 PO), Take by mouth., Disp: , Rfl:  .  docusate sodium (COLACE) 50 MG capsule, Take 50 mg by mouth 2 (two) times daily., Disp: , Rfl:  .  doxycycline (VIBRAMYCIN) 100 MG capsule, , Disp: , Rfl:  .  Elastic Bandages & Supports (MEDICAL COMPRESSION STOCKINGS) MISC, 2 Units by Does not apply route daily., Disp: 2 each, Rfl: 1 .  finasteride (PROSCAR) 5 MG tablet, Take 5 mg by mouth daily., Disp: , Rfl:  .  lisinopril (PRINIVIL,ZESTRIL) 40 MG tablet, Take 40 mg by mouth daily., Disp: , Rfl:  .  metoprolol tartrate (LOPRESSOR) 12.5 mg TABS tablet, Take 12.5 mg by mouth 2 (two) times daily., Disp: , Rfl:  .  Multiple Vitamin (MULTIVITAMIN WITH MINERALS) TABS tablet, Take 1 tablet by mouth daily., Disp: , Rfl:  .  pimecrolimus (ELIDEL) 1 % cream, Apply topically 2 (two) times daily., Disp: 30 g,  Rfl: 0 .  potassium chloride (KLOR-CON) 8 MEQ tablet, Take 2 mEq by mouth 2 (two) times daily., Disp: , Rfl:  .  pravastatin (PRAVACHOL) 40 MG tablet, Take 20 mg by mouth daily., Disp: , Rfl:  .  Pyridoxine HCl (VITAMIN B-6 PO), Take by mouth., Disp: , Rfl:  .  ranitidine (ZANTAC) 150 MG tablet, Take 150 mg by mouth daily., Disp: , Rfl:  .  VENTOLIN HFA 108 (90 Base) MCG/ACT inhaler, , Disp: , Rfl:   No Known Allergies   ROS  Constitutional: Negative for fever or weight change.  Respiratory: Positive  for mild cough but no  shortness of breath.   Cardiovascular: Negative for chest pain or palpitations.  Gastrointestinal: Negative for abdominal pain, no bowel changes.  Musculoskeletal: Negative for gait problem or joint swelling.  Skin: Negative for rash.  Neurological: Negative for dizziness or headache.  No other specific complaints in a complete review of systems (except as listed in HPI above).  Objective  Vitals:   10/22/16 0917  BP: (!) 146/68  Pulse: (!) 58  Resp: 18  Temp: 97.6 F (36.4 C)  TempSrc: Oral  SpO2: 98%  Weight: 243 lb 9.6 oz (110.5 kg)  Height: 5' 9.5" (1.765 m)    Body mass index is 35.46 kg/m.  Physical Exam  Constitutional: Patient appears well-developed and well-nourished. No distress.  HENT: Head: Normocephalic and atraumatic. Ears: B TMs ok, no erythema or effusion; Nose: Nose normal. Mouth/Throat: Oropharynx is clear and moist. No oropharyngeal exudate.  Eyes: Conjunctivae and EOM are normal. Pupils are equal, round, and reactive to light. No scleral icterus.  Neck: Normal range of motion. Neck supple. No JVD present. No thyromegaly present.  Cardiovascular: Normal rate, regular rhythm and normal heart sounds.  No murmur heard. No BLE edema. Pulmonary/Chest: Effort normal and breath sounds normal. No respiratory distress. Abdominal: Soft. Bowel sounds are normal, no distension. There is no tenderness. no masses MALE GENITALIA: Normal descended  testes bilaterally, no masses palpated, no hernias, no lesions, no discharge RECTAL: Prostate normal size and consistency, no rectal masses or hemorrhoids Musculoskeletal: Normal range of motion, no joint effusions. No gross deformities Neurological: he is alert and oriented to person, place, and time. No cranial nerve deficit. Coordination, balance, strength, speech and gait are normal.  Skin: Skin is warm and dry. No rash noted. No erythema.  Psychiatric: Patient has a normal mood and affect. behavior is normal. Judgment and thought content normal.  PHQ2/9: Depression screen [facility] 2/9 10/22/2016 04/23/2016 09/26/2015 07/18/2015 03/19/2015  Decreased Interest 0 0 0 0 0  Down, Depressed, Hopeless 0 0 0 1 0  PHQ - 2 Score 0 0 0 1 0     Fall Risk: Fall Risk  10/22/2016 04/23/2016 09/26/2015 07/18/2015 03/19/2015  Falls in the past year? No No No No No     Functional Status Survey: Is the patient deaf or have difficulty hearing?: Yes (Has hearing aids-Bilateral Ears) Does the patient have difficulty seeing, even when wearing glasses/contacts?: No Does the patient have difficulty concentrating, remembering, or making decisions?: No Does the patient have difficulty walking or climbing stairs?: No Does the patient have difficulty dressing or bathing?: No Does the patient have difficulty doing errands alone such as visiting a doctor's office or shopping?: No    Assessment & Plan  1. Medicare annual wellness visit, subsequent  Discussed importance of 150 minutes of physical activity weekly, eat two servings of fish weekly, eat one serving of tree nuts ( cashews, pistachios, pecans, almonds.[name]) every other day, eat 6 servings of fruit/vegetables daily and drink plenty of water and avoid sweet beverages.   2. Dyslipidemia  - Lipid panel  3. Hypertension goal BP (blood pressure) < 140/90  - COMPLETE METABOLIC PANEL WITH GFR - CBC with Differential/Platelet - TSH  4. S/P coronary artery  bypass graft x 3  He denies chest pain, still taking aspirin, statin and beta-blocker  5. Prostate cancer screening  - PSA  6. Long-term use of high-risk medication  Check labs  7. Needs flu shot  - Flu vaccine HIGH DOSE PF  8. Vitamin D deficiency  - VITAMIN D 25 Hydroxy (Vit-D Deficiency, Fractures)  9. B12 deficiency  - Vitamin B12  10. Morbid obesity (HCC)  - Hemoglobin A1c - Insulin, fasting - TSH  11. Acute bronchitis, unspecified organism  He went to urgent care and is doing well now   12. Benign prostatic hyperplasia with lower urinary tract symptoms, symptom details unspecified  - Ambulatory referral to Urology

## 2016-10-23 LAB — COMPLETE METABOLIC PANEL WITH GFR
ALBUMIN: 4.3 g/dL (ref 3.6–5.1)
ALK PHOS: 52 U/L (ref 40–115)
ALT: 24 U/L (ref 9–46)
AST: 20 U/L (ref 10–35)
BUN: 12 mg/dL (ref 7–25)
CO2: 28 mmol/L (ref 20–31)
Calcium: 9.1 mg/dL (ref 8.6–10.3)
Chloride: 104 mmol/L (ref 98–110)
Creat: 1.02 mg/dL (ref 0.70–1.25)
GFR, EST AFRICAN AMERICAN: 86 mL/min (ref >=60)
GFR, EST NON AFRICAN AMERICAN: 75 mL/min (ref >=60)
Glucose, Bld: 101 mg/dL — ABNORMAL HIGH (ref 65–99)
Potassium: 3.8 mmol/L (ref 3.5–5.3)
Sodium: 142 mmol/L (ref 135–146)
TOTAL PROTEIN: 7.3 g/dL (ref 6.1–8.1)
Total Bilirubin: 0.8 mg/dL (ref 0.2–1.2)

## 2016-10-23 LAB — LIPID PANEL
CHOL/HDL RATIO: 6.8 ratio — AB (ref <5.0)
CHOLESTEROL: 171 mg/dL (ref <200)
HDL: 25 mg/dL — AB (ref >40)
LDL Cholesterol: 99 mg/dL (ref <100)
TRIGLYCERIDES: 234 mg/dL — AB (ref <150)
VLDL: 47 mg/dL — ABNORMAL HIGH (ref <30)

## 2016-10-23 LAB — VITAMIN B12: Vitamin B-12: 1018 pg/mL (ref 200–1100)

## 2016-10-23 LAB — TSH: TSH: 1.36 mIU/L (ref 0.40–4.50)

## 2016-10-23 LAB — PSA: PSA: 0.8 ng/mL (ref <=4.0)

## 2016-10-23 LAB — VITAMIN D 25 HYDROXY (VIT D DEFICIENCY, FRACTURES): Vit D, 25-Hydroxy: 44 ng/mL (ref 30–100)

## 2016-10-23 LAB — HEMOGLOBIN A1C
HEMOGLOBIN A1C: 5.7 % — AB (ref <5.7)
Mean Plasma Glucose: 117 mg/dL

## 2016-10-23 LAB — INSULIN, FASTING: INSULIN FASTING, SERUM: 22.5 u[IU]/mL — AB (ref 2.0–19.6)

## 2016-11-08 ENCOUNTER — Ambulatory Visit: Payer: Medicare HMO

## 2016-11-15 ENCOUNTER — Ambulatory Visit: Payer: Medicare HMO

## 2016-12-17 ENCOUNTER — Ambulatory Visit (INDEPENDENT_AMBULATORY_CARE_PROVIDER_SITE_OTHER): Payer: Medicare HMO | Admitting: Urology

## 2016-12-17 ENCOUNTER — Encounter: Payer: Self-pay | Admitting: Urology

## 2016-12-17 VITALS — BP 159/92 | HR 54 | Ht 70.0 in | Wt 251.5 lb

## 2016-12-17 DIAGNOSIS — N400 Enlarged prostate without lower urinary tract symptoms: Secondary | ICD-10-CM | POA: Diagnosis not present

## 2016-12-17 DIAGNOSIS — N3281 Overactive bladder: Secondary | ICD-10-CM | POA: Diagnosis not present

## 2016-12-17 DIAGNOSIS — Z125 Encounter for screening for malignant neoplasm of prostate: Secondary | ICD-10-CM | POA: Diagnosis not present

## 2016-12-17 LAB — BLADDER SCAN AMB NON-IMAGING: Scan Result: 86

## 2016-12-17 MED ORDER — MIRABEGRON ER 25 MG PO TB24: 25.0000 mg | ORAL_TABLET | Freq: Every day | ORAL | 11 refills | Status: DC

## 2016-12-17 NOTE — Progress Notes (Signed)
12/17/2016 3:53 PM   Darius Grimes December 07, 1945 [id number]  Referring provider: Steele Sizer, MD [address] [place], [place] [address]  Chief Complaint  Patient presents with  . Benign Prostatic Hypertrophy    new patient    HPI: The patient is a 71 year old gentleman who presents to discuss problems with BPH and lower urinary tract symptoms.  1. BPH He is currently on Flomax 0.4 mg and finasteride 5 mg. His I PSS score today is 18/5. On questioning, his biggest complaint is nocturia 5-6 times per night. He says this is interrupting his sleep. This is his main concern and he would be happy if this is corrected. His next biggest complaint is urgency. 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 He does not have urge incontinence. Reports a good stream and that he does not have to strain. He does have sometimes daytime frequency.  PVR: 86 cc  Of note, the patient has had multiple sleep studies and has been told that he does not have sleep apnea.  2. Prostate cancer screening PSA was 0.8 in February 2018     PMH: Past Medical History:  Diagnosis Date  . Acid reflux   . Allergic rhinitis   . Episodic atrial fibrillation ([place])   . GERD (gastroesophageal reflux disease)   . Heart palpitations   . Hyperlipidemia   . Hypertension   . Kidney stones   . Neck pain   . Recurrent nephrolithiasis   . Skin lesion of back     Surgical History: Past Surgical History:  Procedure Laterality Date  . CARPAL TUNNEL RELEASE Left   . HYDROCELE EXCISION / REPAIR    . PROSTATE SURGERY    . THROAT SURGERY    . URETERAL STENT PLACEMENT     at the [place]  . Vocal cord poylp removal      Home Medications:  Allergies as of 12/17/2016   No Known Allergies     Medication List       Accurate as of 12/17/16  3:53 PM. Always use your most recent med list.          amLODipine 5 MG tablet Commonly  known as:  NORVASC Take 5 mg by mouth 2 (two) times daily.   aspirin 81 MG tablet Take by mouth.   docusate sodium 50 MG capsule Commonly known as:  COLACE Take 50 mg by mouth 2 (two) times daily.   doxycycline 100 MG capsule Commonly known as:  VIBRAMYCIN   finasteride 5 MG tablet Commonly known as:  PROSCAR Take 5 mg by mouth daily.   lisinopril 40 MG tablet Commonly known as:  PRINIVIL,ZESTRIL Take 40 mg by mouth daily.   Medical Compression Stockings Misc 2 Units by Does not apply route daily.   metoprolol tartrate 12.5 mg Tabs tablet Commonly known as:  LOPRESSOR Take 12.5 mg by mouth 2 (two) times daily.   mirabegron ER 25 MG Tb24 tablet Commonly known as:  MYRBETRIQ Take 1 tablet (25 mg total) by mouth daily.   multivitamin with minerals Tabs tablet Take 1 tablet by mouth daily.   pimecrolimus 1 % cream Commonly known as:  ELIDEL Apply topically 2 (two) times daily.   potassium chloride 8 MEQ tablet Commonly known as:  KLOR-CON Take 2 mEq by mouth 2 (two) times daily.   pravastatin 40 MG tablet Commonly known as:  PRAVACHOL  Take 20 mg by mouth daily.   ranitidine 150 MG tablet Commonly known as:  ZANTAC Take 150 mg by mouth daily.   tamsulosin 0.4 MG Caps capsule Commonly known as:  FLOMAX Take 0.4 mg by mouth.   VENTOLIN HFA 108 (90 Base) MCG/ACT inhaler Generic drug:  albuterol   VITAMIN B 12 PO Take by mouth.   VITAMIN B-6 PO Take by mouth.       Allergies: No Known Allergies  Family History: Family History  Problem Relation Age of Onset  . CVA Mother   . Hypotension Mother   . CAD Father   . Heart attack Father   . Hypertension Sister   . Hypertension Brother   . Obesity Brother     Social History:  reports that he quit smoking about 33 years ago. His smoking use included Cigarettes. He has a 7.00 pack-year smoking history. He has never used smokeless tobacco. He reports that he does not drink alcohol or use  drugs.  ROS: UROLOGY Frequent Urination?: Yes Hard to postpone urination?: Yes Burning/pain with urination?: No Get up at night to urinate?: Yes Leakage of urine?: Yes Urine stream starts and stops?: Yes Trouble starting stream?: Yes Do you have to strain to urinate?: Yes Blood in urine?: No Urinary tract infection?: No Sexually transmitted disease?: No Injury to kidneys or bladder?: No Painful intercourse?: No Weak stream?: No Erection problems?: Yes Penile pain?: No  Gastrointestinal Nausea?: No Vomiting?: No Indigestion/heartburn?: No Diarrhea?: No Constipation?: No  Constitutional Fever: No Night sweats?: No Weight loss?: No Fatigue?: Yes  Skin Skin rash/lesions?: No Itching?: No  Eyes Blurred vision?: No Double vision?: No  Ears/Nose/Throat Sore throat?: No Sinus problems?: Yes  Hematologic/Lymphatic Swollen glands?: Yes Easy bruising?: No  Cardiovascular Leg swelling?: Yes Chest pain?: No  Respiratory Cough?: No Shortness of breath?: No  Endocrine Excessive thirst?: Yes  Musculoskeletal Back pain?: No Joint pain?: Yes  Neurological Headaches?: No Dizziness?: No  Psychologic Depression?: No Anxiety?: Yes  Physical Exam: BP (!) 159/92   Pulse (!) 54   Ht 5\' 10"  (1.778 m)   Wt 251 lb 8 oz (114.1 kg)   BMI 36.09 kg/m   Constitutional:  Alert and oriented, No acute distress. HEENT: [place] AT, moist mucus membranes.  Trachea midline, no masses. Cardiovascular: No clubbing, cyanosis, or edema. Respiratory: Normal respiratory effort, no increased work of breathing. GI: Abdomen is soft, nontender, nondistended, no abdominal masses GU: No CVA tenderness. Normal phallus. Testicles descended bilaterally. Benign. DRE: 30 g benign. Skin: No rashes, bruises or suspicious lesions. Lymph: No cervical or inguinal adenopathy. Neurologic: Grossly intact, no focal deficits, moving all 4 extremities. Psychiatric: Normal mood and  affect.  Laboratory Data: Lab Results  Component Value Date   WBC 7.3 10/22/2016   HGB 15.7 10/22/2016   HCT 45.6 10/22/2016   MCV 88.0 10/22/2016   PLT 219 10/22/2016    Lab Results  Component Value Date   CREATININE 1.02 10/22/2016    Lab Results  Component Value Date   PSA 0.8 10/22/2016   PSA 1.5-Normal 06/23/2012    No results found for: TESTOSTERONE  Lab Results  Component Value Date   HGBA1C 5.7 (H) 10/22/2016    Urinalysis No results found for: COLORURINE, APPEARANCEUR, LABSPEC, PHURINE, GLUCOSEU, HGBUR, BILIRUBINUR, KETONESUR, PROTEINUR, UROBILINOGEN, NITRITE, LEUKOCYTESUR   Assessment & Plan:    1. Overactive bladder The patient's symptoms are more consistent with an overactive bladder symptoms nocturia and frequency, and urgency that has complaints.  He has had a sleep study and does not have obstructive sleep apnea. He was advised to limit fluids after 6 PM at night. We'll also start him on a trial number of Myrbetriq 25 mg.  If this is too expensive, we can try a anticholinergic.  2. BPH -continue flomax and finasteride  3. Prostate cancer screening Up-to-date   Return in about 3 months (around 03/18/2017).  Nickie Retort, MD  [facility] [address] [place], [place] [address] [phone]

## 2016-12-24 ENCOUNTER — Encounter: Payer: Self-pay | Admitting: Family Medicine

## 2016-12-24 ENCOUNTER — Ambulatory Visit (INDEPENDENT_AMBULATORY_CARE_PROVIDER_SITE_OTHER): Payer: Medicare HMO | Admitting: Family Medicine

## 2016-12-24 VITALS — BP 142/64 | HR 76 | Temp 98.1°F | Resp 16 | Wt 249.1 lb

## 2016-12-24 DIAGNOSIS — G8929 Other chronic pain: Secondary | ICD-10-CM | POA: Diagnosis not present

## 2016-12-24 DIAGNOSIS — M25562 Pain in left knee: Secondary | ICD-10-CM | POA: Diagnosis not present

## 2016-12-24 DIAGNOSIS — M25561 Pain in right knee: Secondary | ICD-10-CM | POA: Diagnosis not present

## 2016-12-24 NOTE — Progress Notes (Addendum)
Name: Darius Grimes   MRN: [id number]    DOB: April 19, 1946   Date:12/24/2016       Progress Note  Subjective  Chief Complaint  Chief Complaint  Patient presents with  . Knee Pain    left knee pain and swelling pt would like a referral to ortho    HPI  Pt presents to request referral for Orthopedist for ongoing bilateral knee pain, worse on left knee.  He received a Kenalog injection on 04/23/2017 to the left knee and did well for several months, but the pain has gotten progressively worse over the last 2-3 months. He has been taking Tylenol and applying Voltaren gel with minimal relief; he has also been taking ibuprofen 253m TID - discussed risk of daily use s/p CABG and encouraged patient to avoid NSAID use. He is very active and walks his dog daily, works at [employer].   Patient Active Problem List   Diagnosis Date Noted  . Vitamin D deficiency 10/22/2016  . B12 deficiency 10/22/2016  . Wears hearing aid 08/19/2015  . Bronchitis with bronchospasm 07/18/2015  . Allergic rhinitis 03/19/2015  . AF (paroxysmal atrial fibrillation) ([place] 03/19/2015  . Acid reflux 03/19/2015  . Dyslipidemia 03/19/2015  . Hypertension goal BP (blood pressure) < 140/90 03/19/2015  . Cervical radiculitis 03/19/2015  . Calculus of kidney 03/19/2015  . S/P coronary artery bypass graft x 3 03/06/2015    Social History  Substance Use Topics  . Smoking status: Former Smoker    Packs/day: 1.00    Years: 7.00    Types: Cigarettes    Quit date: 08/31/1983  . Smokeless tobacco: Never Used  . Alcohol use No     Current Outpatient Prescriptions:  .  amLODipine (NORVASC) 5 MG tablet, Take 5 mg by mouth 2 (two) times daily., Disp: , Rfl:  .  aspirin 81 MG tablet, Take by mouth., Disp: , Rfl:  .  Cyanocobalamin (VITAMIN B 12 PO), Take by mouth., Disp: , Rfl:  .  docusate sodium (COLACE) 50 MG capsule, Take 50 mg by mouth 2 (two) times daily., Disp: , Rfl:  .  doxycycline (VIBRAMYCIN)  100 MG capsule, , Disp: , Rfl:  .  Elastic Bandages & Supports (MEDICAL COMPRESSION STOCKINGS) MISC, 2 Units by Does not apply route daily., Disp: 2 each, Rfl: 1 .  finasteride (PROSCAR) 5 MG tablet, Take 5 mg by mouth daily., Disp: , Rfl:  .  lisinopril (PRINIVIL,ZESTRIL) 40 MG tablet, Take 40 mg by mouth daily., Disp: , Rfl:  .  metoprolol tartrate (LOPRESSOR) 12.5 mg TABS tablet, Take 12.5 mg by mouth 2 (two) times daily., Disp: , Rfl:  .  mirabegron ER (MYRBETRIQ) 25 MG TB24 tablet, Take 1 tablet (25 mg total) by mouth daily., Disp: 30 tablet, Rfl: 11 .  Multiple Vitamin (MULTIVITAMIN WITH MINERALS) TABS tablet, Take 1 tablet by mouth daily., Disp: , Rfl:  .  pimecrolimus (ELIDEL) 1 % cream, Apply topically 2 (two) times daily., Disp: 30 g, Rfl: 0 .  potassium chloride (KLOR-CON) 8 MEQ tablet, Take 2 mEq by mouth 2 (two) times daily., Disp: , Rfl:  .  pravastatin (PRAVACHOL) 40 MG tablet, Take 20 mg by mouth daily., Disp: , Rfl:  .  Pyridoxine HCl (VITAMIN B-6 PO), Take by mouth., Disp: , Rfl:  .  ranitidine (ZANTAC) 150 MG tablet, Take 150 mg by mouth daily., Disp: , Rfl:  .  tamsulosin (FLOMAX) 0.4 MG CAPS capsule, Take 0.4 mg by mouth.,  Disp: , Rfl:  .  VENTOLIN HFA 108 (90 Base) MCG/ACT inhaler, , Disp: , Rfl:   No Known Allergies  ROS  Constitutional: Negative for fever or weight change.  Respiratory: Negative for cough and shortness of breath.   Cardiovascular: Negative for chest pain or palpitations.  Gastrointestinal: Negative for abdominal pain, no bowel changes.  Musculoskeletal: Negative for gait problem. Positive for joint swelling and pain.  Skin: Negative for rash.  Neurological: Negative for dizziness or headache.  No other specific complaints in a complete review of systems (except as listed in HPI above).  Objective  Vitals:   12/24/16 1436  BP: (!) 142/64  Pulse: 76  Resp: 16  Temp: 98.1 F (36.7 C)  SpO2: 92%  Weight: 249 lb 2 oz (113 kg)    Body mass  index is 35.75 kg/m.  Nursing Note and Vital Signs reviewed.  Physical Exam  Constitutional: Patient appears well-developed and well-nourished. Obese, No distress.  HEENT: head atraumatic, normocephalic Cardiovascular: Normal rate, regular rhythm, S1/S2 present.  No murmur or rub heard. No BLE edema. Pulmonary/Chest: Effort normal and breath sounds clear. No respiratory distress or retractions. MSK: Crepitus present in bilateral knees. Mild non-pitting edema to left knee. No point tenderness, no bony tenderness, full AROM of BLE including hip joint. No erythema, laxity, or effusion noted, normal popliteal fossa with +2 pulse palpated bilaterally. Sensation intact bilat. Psychiatric: Patient has a normal mood and affect. behavior is normal. Judgment and thought content normal.  Recent Results (from the past 2160 hour(s))  Lipid panel     Status: Abnormal   Collection Time: 10/22/16 10:11 AM  Result Value Ref Range   Cholesterol 171 <200 mg/dL   Triglycerides 234 (H) <150 mg/dL   HDL 25 (L) >40 mg/dL   Total CHOL/HDL Ratio 6.8 (H) <5.0 Ratio   VLDL 47 (H) <30 mg/dL   LDL Cholesterol 99 <100 mg/dL  PSA     Status: None   Collection Time: 10/22/16 10:11 AM  Result Value Ref Range   PSA 0.8 <=4.0 ng/mL    Comment:   The total PSA value from this assay system is standardized against the WHO standard. The test result will be approximately 20% lower when compared to the equimolar-standardized total PSA ([employer]). Comparison of serial PSA results should be interpreted with this fact in mind.   This test was performed using the Siemens chemiluminescent method. Values obtained from different assay methods cannot be used interchangeably. PSA levels, regardless of value, should not be interpreted as absolute evidence of the presence or absence of disease.     COMPLETE METABOLIC PANEL WITH GFR     Status: Abnormal   Collection Time: 10/22/16 10:11 AM  Result Value Ref Range    Sodium 142 135 - 146 mmol/L   Potassium 3.8 3.5 - 5.3 mmol/L   Chloride 104 98 - 110 mmol/L   CO2 28 20 - 31 mmol/L   Glucose, Bld 101 (H) 65 - 99 mg/dL   BUN 12 7 - 25 mg/dL   Creat 1.02 0.70 - 1.25 mg/dL    Comment:   For patients > or = 70 years of age: The upper reference limit for Creatinine is approximately 13% higher for people identified as African-American.      Total Bilirubin 0.8 0.2 - 1.2 mg/dL   Alkaline Phosphatase 52 40 - 115 U/L   AST 20 10 - 35 U/L   ALT 24 9 - 46 U/L   Total  Protein 7.3 6.1 - 8.1 g/dL   Albumin 4.3 3.6 - 5.1 g/dL   Calcium 9.1 8.6 - 10.3 mg/dL   GFR, Est African American 86 >=60 mL/min   GFR, Est Non African American 75 >=60 mL/min  CBC with Differential/Platelet     Status: None   Collection Time: 10/22/16 10:11 AM  Result Value Ref Range   WBC 7.3 3.8 - 10.8 K/uL   RBC 5.18 4.20 - 5.80 MIL/uL   Hemoglobin 15.7 13.2 - 17.1 g/dL   HCT 45.6 38.5 - 50.0 %   MCV 88.0 80.0 - 100.0 fL   MCH 30.3 27.0 - 33.0 pg   MCHC 34.4 32.0 - 36.0 g/dL   RDW 13.9 11.0 - 15.0 %   Platelets 219 140 - 400 K/uL   MPV 10.7 7.5 - 12.5 fL   Neutro Abs 4,745 1,500 - 7,800 cells/uL   Lymphs Abs 1,898 850 - 3,900 cells/uL   Monocytes Absolute 511 200 - 950 cells/uL   Eosinophils Absolute 146 15 - 500 cells/uL   Basophils Absolute 0 0 - 200 cells/uL   Neutrophils Relative % 65 %   Lymphocytes Relative 26 %   Monocytes Relative 7 %   Eosinophils Relative 2 %   Basophils Relative 0 %   Smear Review Criteria for review not met   Hemoglobin A1c     Status: Abnormal   Collection Time: 10/22/16 10:11 AM  Result Value Ref Range   Hgb A1c MFr Bld 5.7 (H) <5.7 %    Comment:   For someone without known diabetes, a hemoglobin A1c value between 5.7% and 6.4% is consistent with prediabetes and should be confirmed with a follow-up test.   For someone with known diabetes, a value <7% indicates that their diabetes is well controlled. A1c targets should be  individualized based on duration of diabetes, age, co-morbid conditions and other considerations.   This assay result is consistent with an increased risk of diabetes.   Currently, no consensus exists regarding use of hemoglobin A1c for diagnosis of diabetes in children.      Mean Plasma Glucose 117 mg/dL  Insulin, fasting     Status: Abnormal   Collection Time: 10/22/16 10:11 AM  Result Value Ref Range   Insulin fasting, serum 22.5 (H) 2.0 - 19.6 uIU/mL    Comment:   This insulin assay shows strong cross-reactivity for some insulin analogs (lispro, aspart, and glargine) and much lower cross-reactivity with others (detemir, glulisine).   Stimulated Insulin reference intervals were established using the Siemens Immulite assay. These values are provided for general guidance only.   VITAMIN D 25 Hydroxy (Vit-D Deficiency, Fractures)     Status: None   Collection Time: 10/22/16 10:11 AM  Result Value Ref Range   Vit D, 25-Hydroxy 44 30 - 100 ng/mL    Comment: Vitamin D Status           25-OH Vitamin D        Deficiency                <20 ng/mL        Insufficiency         20 - 29 ng/mL        Optimal             > or = 30 ng/mL   For 25-OH Vitamin D testing on patients on D2-supplementation and patients for whom quantitation of D2 and D3 fractions is required, the QuestAssureD 25-OH VIT  D, (D2,D3), LC/MS/MS is recommended: order code [id number] (patients > 2 yrs).   Vitamin B12     Status: None   Collection Time: 10/22/16 10:11 AM  Result Value Ref Range   Vitamin B-12 1,018 200 - 1,100 pg/mL  TSH     Status: None   Collection Time: 10/22/16 10:11 AM  Result Value Ref Range   TSH 1.36 0.40 - 4.50 mIU/L  Bladder Scan (Post Void Residual) in office     Status: None   Collection Time: 12/17/16  3:25 PM  Result Value Ref Range   Scan Result 86      Assessment & Plan  1. Chronic pain of both knees - Ambulatory referral to Orthopedic Surgery -Continue Tylenol and Voltaren  Gel.  Strongly encouraged patient to avoid NSAIDs and use only Tylenol, Voltaren as needed. -Discussed repeating Kenalog injection in office and/or trying physical therapy, but patient prefers to see Ortho before considering these options.  -Red flags and when to present for emergency care or RTC including fever >101.90F, chest pain, shortness of breath, new/worsening/un-resolving symptoms, and rapid increase in swelling and/or pain reviewed with patient at time of visit. Follow up and care instructions discussed and provided in AVS.   Discussed this patient with the Johney Maine, FNP, NP-C. I am certifying that I agree with the content of this note as supervising physician.  Steele Sizer, MD [facility] 12/24/2016, 3:14 PM

## 2017-01-27 DIAGNOSIS — M170 Bilateral primary osteoarthritis of knee: Secondary | ICD-10-CM | POA: Diagnosis not present

## 2017-01-27 DIAGNOSIS — M25562 Pain in left knee: Secondary | ICD-10-CM | POA: Diagnosis not present

## 2017-01-27 DIAGNOSIS — M25561 Pain in right knee: Secondary | ICD-10-CM | POA: Diagnosis not present

## 2017-02-18 DIAGNOSIS — M1712 Unilateral primary osteoarthritis, left knee: Secondary | ICD-10-CM | POA: Diagnosis not present

## 2017-03-18 ENCOUNTER — Ambulatory Visit: Payer: Medicare HMO | Admitting: Urology

## 2017-04-22 ENCOUNTER — Encounter: Payer: Self-pay | Admitting: Family Medicine

## 2017-04-22 ENCOUNTER — Ambulatory Visit (INDEPENDENT_AMBULATORY_CARE_PROVIDER_SITE_OTHER): Payer: Medicare HMO | Admitting: Family Medicine

## 2017-04-22 ENCOUNTER — Ambulatory Visit: Payer: Medicare HMO | Admitting: Urology

## 2017-04-22 VITALS — BP 138/78 | HR 60 | Temp 97.8°F | Resp 16 | Ht 70.0 in | Wt 249.6 lb

## 2017-04-22 DIAGNOSIS — I480 Paroxysmal atrial fibrillation: Secondary | ICD-10-CM

## 2017-04-22 DIAGNOSIS — Z951 Presence of aortocoronary bypass graft: Secondary | ICD-10-CM | POA: Diagnosis not present

## 2017-04-22 DIAGNOSIS — I10 Essential (primary) hypertension: Secondary | ICD-10-CM

## 2017-04-22 DIAGNOSIS — M546 Pain in thoracic spine: Secondary | ICD-10-CM | POA: Diagnosis not present

## 2017-04-22 DIAGNOSIS — Z23 Encounter for immunization: Secondary | ICD-10-CM | POA: Diagnosis not present

## 2017-04-22 DIAGNOSIS — E8881 Metabolic syndrome: Secondary | ICD-10-CM

## 2017-04-22 DIAGNOSIS — E559 Vitamin D deficiency, unspecified: Secondary | ICD-10-CM

## 2017-04-22 DIAGNOSIS — E785 Hyperlipidemia, unspecified: Secondary | ICD-10-CM | POA: Diagnosis not present

## 2017-04-22 DIAGNOSIS — E538 Deficiency of other specified B group vitamins: Secondary | ICD-10-CM

## 2017-04-22 NOTE — Progress Notes (Signed)
Name: Darius Grimes   MRN: [id number]    DOB: 1946/01/16   Date:04/22/2017       Progress Note  Subjective  Chief Complaint  Chief Complaint  Patient presents with  . Hypertension  . Obesity  . Benign Prostatic Hypertrophy  . Medication Refill    6 month F/U  . Back Pain    from between shoulder blades down his back for 1 week    HPI  HTN: taking medication and denies side effects. BP is at goal. No chest pain or recent palpitation.  Thoracic spine pain: he used a weed eater and developed mid back pain, described as sharp, aggravated by  Movement, but not associated with nausea , vomiting or diaphoresis  Afib: doing well, no buzzing sensation in his chest, he did not want to start anti-coagulants after CABG, he is only on aspirin  Hyperlipidemia: taking statin therapy and no myalgias. No chest pain, or SOB  CABG: he is taking aspirin, statin and beta-blocker, he denies any chest pain or decrease in exercise tolerance.  Obesity: his weight is stable, he is not going to the gym but he has been walking his dog, discussed life style modification  Pre-diabetes: he denies polyphagia, polydipsia or polyuria ( he has nocturia- sees Urologist) , he wants to continue life style modification. He likes to drink water ( always been that way)    Patient Active Problem List   Diagnosis Date Noted  . Vitamin D deficiency 10/22/2016  . B12 deficiency 10/22/2016  . Wears hearing aid 08/19/2015  . Bronchitis with bronchospasm 07/18/2015  . Allergic rhinitis 03/19/2015  . AF (paroxysmal atrial fibrillation) ([place]) 03/19/2015  . Acid reflux 03/19/2015  . Dyslipidemia 03/19/2015  . Hypertension goal BP (blood pressure) < 140/90 03/19/2015  . Cervical radiculitis 03/19/2015  . Calculus of kidney 03/19/2015  . S/P coronary artery bypass graft x 3 03/06/2015    Past Surgical History:  Procedure Laterality Date  . CARPAL TUNNEL RELEASE Left   . HYDROCELE EXCISION / REPAIR    . PROSTATE  SURGERY    . THROAT SURGERY    . URETERAL STENT PLACEMENT     at the [place]  . Vocal cord poylp removal      Family History  Problem Relation Age of Onset  . CVA Mother   . Hypotension Mother   . CAD Father   . Heart attack Father   . Hypertension Sister   . Hypertension Brother   . Obesity Brother     Social History   Social History  . Marital status: Married    Spouse name: N/A  . Number of children: N/A  . Years of education: N/A   Occupational History  . Not on file.   Social History Main Topics  . Smoking status: Former Smoker    Packs/day: 1.00    Years: 7.00    Types: Cigarettes    Quit date: 08/31/1983  . Smokeless tobacco: Never Used  . Alcohol use No  . Drug use: No  . Sexual activity: Yes    Partners: Female   Other Topics Concern  . 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      Current Outpatient Prescriptions:  .  amLODipine (NORVASC) 5 MG tablet, Take 5 mg by mouth 2 (two) times daily.,  Disp: , Rfl:  .  aspirin 81 MG tablet, Take by mouth., Disp: , Rfl:  .  Cyanocobalamin (VITAMIN B 12 PO), Take by mouth., Disp: , Rfl:  .  docusate sodium (COLACE) 50 MG capsule, Take 50 mg by mouth 2 (two) times daily., Disp: , Rfl:  .  doxycycline (VIBRAMYCIN) 100 MG capsule, , Disp: , Rfl:  .  Elastic Bandages & Supports (MEDICAL COMPRESSION STOCKINGS) MISC, 2 Units by Does not apply route daily., Disp: 2 each, Rfl: 1 .  finasteride (PROSCAR) 5 MG tablet, Take 5 mg by mouth daily., Disp: , Rfl:  .  lisinopril (PRINIVIL,ZESTRIL) 40 MG tablet, Take 40 mg by mouth daily., Disp: , Rfl:  .  metoprolol tartrate (LOPRESSOR) 12.5 mg TABS tablet, Take 12.5 mg by mouth 2 (two) times daily., Disp: , Rfl:  .  mirabegron ER (MYRBETRIQ) 25 MG TB24 tablet, Take 1 tablet (25 mg total) by mouth daily., Disp: 30 tablet, Rfl: 11 .  Multiple Vitamin  (MULTIVITAMIN WITH MINERALS) TABS tablet, Take 1 tablet by mouth daily., Disp: , Rfl:  .  pimecrolimus (ELIDEL) 1 % cream, Apply topically 2 (two) times daily., Disp: 30 g, Rfl: 0 .  potassium chloride (KLOR-CON) 8 MEQ tablet, Take 2 mEq by mouth 2 (two) times daily., Disp: , Rfl:  .  pravastatin (PRAVACHOL) 40 MG tablet, Take 20 mg by mouth daily., Disp: , Rfl:  .  Pyridoxine HCl (VITAMIN B-6 PO), Take by mouth., Disp: , Rfl:  .  ranitidine (ZANTAC) 150 MG tablet, Take 150 mg by mouth daily., Disp: , Rfl:  .  tamsulosin (FLOMAX) 0.4 MG CAPS capsule, Take 0.4 mg by mouth., Disp: , Rfl:  .  VENTOLIN HFA 108 (90 Base) MCG/ACT inhaler, , Disp: , Rfl:   No Known Allergies   ROS  Constitutional: Negative for fever or weight change.  Respiratory: Negative for cough and shortness of breath.   Cardiovascular: Negative for chest pain or palpitations.  Gastrointestinal: Negative for abdominal pain, no bowel changes.  Musculoskeletal: Positive  for gait problem and left knee  joint swelling.  Skin: Negative for rash.  Neurological: Negative for dizziness or headache.  No other specific complaints in a complete review of systems (except as listed in HPI above).  Objective  Vitals:   04/22/17 0906 04/22/17 0911 04/22/17 1222  BP:  138/78   Pulse:  (!) 48 60  Resp:  16   Temp:  97.8 F (36.6 C)   SpO2:  93%   Weight: 249 lb 14.4 oz (113.4 kg) 249 lb 9 oz (113.2 kg)   Height:  5\' 10"  (1.778 m)     Body mass index is 35.81 kg/m.  Physical Exam  Constitutional: Patient appears well-developed and well-nourished. Obese No distress.  HEENT: head atraumatic, normocephalic, pupils equal and reactive to light,  neck supple, throat within normal limits Cardiovascular: Normal rate, regular rhythm and normal heart sounds.  No murmur heard. 1 plus  BLE edema. Pulmonary/Chest: Effort normal and breath sounds normal. No respiratory distress. Abdominal: Soft.  There is no tenderness. Psychiatric:  Patient has a normal mood and affect. behavior is normal. Judgment and thought content normal. Muscular Skeletal: crepitus with extension and mild effusion , no redness - both knees  PHQ2/9: Depression screen [facility] 2/9 10/22/2016 04/23/2016 09/26/2015 07/18/2015 03/19/2015  Decreased Interest 0 0 0 0 0  Down, Depressed, Hopeless 0 0 0 1 0  PHQ - 2 Score 0 0 0 1 0  Fall Risk: Fall Risk  10/22/2016 04/23/2016 09/26/2015 07/18/2015 03/19/2015  Falls in the past year? No No No No No     Assessment & Plan  1. Dyslipidemia  - Lipid panel  2. AF (paroxysmal atrial fibrillation) ([facility])  No palpitation  3. Hypertension goal BP (blood pressure) < 140/90  - COMPLETE METABOLIC PANEL WITH GFR  4. S/P coronary artery bypass graft x 3  On statin, aspirin, ace , bradycardia when he came in, but back to normal during exam   5. Vitamin D deficiency  - VITAMIN D 25 Hydroxy (Vit-D Deficiency, Fractures)  6. B12 deficiency  - Vitamin B12  7. Morbid obesity ([place])  Discussed with the patient the risk posed by an increased BMI. Discussed importance of portion control, calorie counting and at least 150 minutes of physical activity weekly. Avoid sweet beverages and drink more water. Eat at least 6 servings of fruit and vegetables daily   8. Acute midline thoracic back pain  Resolved , discussed symptoms of heart attacks  9. Insulin resistance  - Hemoglobin A1c - Insulin, fasting  10. Needs flu shot  - Flu vaccine HIGH DOSE PF

## 2017-04-25 DIAGNOSIS — E785 Hyperlipidemia, unspecified: Secondary | ICD-10-CM | POA: Diagnosis not present

## 2017-04-25 DIAGNOSIS — I10 Essential (primary) hypertension: Secondary | ICD-10-CM | POA: Diagnosis not present

## 2017-04-25 DIAGNOSIS — E538 Deficiency of other specified B group vitamins: Secondary | ICD-10-CM | POA: Diagnosis not present

## 2017-04-25 DIAGNOSIS — E559 Vitamin D deficiency, unspecified: Secondary | ICD-10-CM | POA: Diagnosis not present

## 2017-04-25 DIAGNOSIS — E8881 Metabolic syndrome: Secondary | ICD-10-CM | POA: Diagnosis not present

## 2017-04-26 LAB — LIPID PANEL
CHOL/HDL RATIO: 5 ratio — AB (ref <5.0)
CHOLESTEROL: 155 mg/dL (ref <200)
HDL: 31 mg/dL — AB (ref >40)
LDL Cholesterol: 71 mg/dL (ref <100)
TRIGLYCERIDES: 266 mg/dL — AB (ref <150)
VLDL: 53 mg/dL — ABNORMAL HIGH (ref <30)

## 2017-04-26 LAB — VITAMIN D 25 HYDROXY (VIT D DEFICIENCY, FRACTURES): Vit D, 25-Hydroxy: 49 ng/mL (ref 30–100)

## 2017-04-26 LAB — COMPLETE METABOLIC PANEL WITH GFR
ALT: 19 U/L (ref 9–46)
AST: 16 U/L (ref 10–35)
Albumin: 4.2 g/dL (ref 3.6–5.1)
Alkaline Phosphatase: 50 U/L (ref 40–115)
BUN: 13 mg/dL (ref 7–25)
CHLORIDE: 105 mmol/L (ref 98–110)
CO2: 25 mmol/L (ref 20–32)
CREATININE: 0.89 mg/dL (ref 0.70–1.18)
Calcium: 9 mg/dL (ref 8.6–10.3)
GFR, EST NON AFRICAN AMERICAN: 87 mL/min (ref >=60)
Glucose, Bld: 101 mg/dL — ABNORMAL HIGH (ref 65–99)
Potassium: 4 mmol/L (ref 3.5–5.3)
Sodium: 143 mmol/L (ref 135–146)
Total Bilirubin: 0.5 mg/dL (ref 0.2–1.2)
Total Protein: 6.6 g/dL (ref 6.1–8.1)

## 2017-04-26 LAB — VITAMIN B12: Vitamin B-12: 892 pg/mL (ref 200–1100)

## 2017-04-26 LAB — HEMOGLOBIN A1C
Hgb A1c MFr Bld: 5.7 % — ABNORMAL HIGH (ref <5.7)
MEAN PLASMA GLUCOSE: 117 mg/dL

## 2017-04-26 LAB — INSULIN, FASTING: Insulin fasting, serum: 22.3 u[IU]/mL — ABNORMAL HIGH (ref 2.0–19.6)

## 2017-04-30 DIAGNOSIS — S29012A Strain of muscle and tendon of back wall of thorax, initial encounter: Secondary | ICD-10-CM | POA: Diagnosis not present

## 2017-05-20 ENCOUNTER — Ambulatory Visit: Payer: Medicare HMO | Admitting: Urology

## 2017-05-20 DIAGNOSIS — S29012D Strain of muscle and tendon of back wall of thorax, subsequent encounter: Secondary | ICD-10-CM | POA: Diagnosis not present

## 2017-06-20 DIAGNOSIS — R262 Difficulty in walking, not elsewhere classified: Secondary | ICD-10-CM | POA: Diagnosis not present

## 2017-06-20 DIAGNOSIS — M1712 Unilateral primary osteoarthritis, left knee: Secondary | ICD-10-CM | POA: Diagnosis not present

## 2017-06-20 DIAGNOSIS — M25562 Pain in left knee: Secondary | ICD-10-CM | POA: Diagnosis not present

## 2017-06-20 DIAGNOSIS — R531 Weakness: Secondary | ICD-10-CM | POA: Diagnosis not present

## 2017-06-21 DIAGNOSIS — M25562 Pain in left knee: Secondary | ICD-10-CM | POA: Diagnosis not present

## 2017-06-22 ENCOUNTER — Encounter
Admission: RE | Admit: 2017-06-22 | Discharge: 2017-06-22 | Disposition: A | Discharge status: Home / Self Care | Payer: Medicare HMO | Source: Ambulatory Visit | Attending: Orthopedic Surgery | Admitting: Orthopedic Surgery

## 2017-06-22 DIAGNOSIS — Z951 Presence of aortocoronary bypass graft: Secondary | ICD-10-CM | POA: Diagnosis not present

## 2017-06-22 DIAGNOSIS — R531 Weakness: Secondary | ICD-10-CM | POA: Diagnosis not present

## 2017-06-22 DIAGNOSIS — I10 Essential (primary) hypertension: Secondary | ICD-10-CM | POA: Insufficient documentation

## 2017-06-22 DIAGNOSIS — I4891 Unspecified atrial fibrillation: Secondary | ICD-10-CM | POA: Diagnosis not present

## 2017-06-22 DIAGNOSIS — Z01818 Encounter for other preprocedural examination: Secondary | ICD-10-CM | POA: Insufficient documentation

## 2017-06-22 DIAGNOSIS — R262 Difficulty in walking, not elsewhere classified: Secondary | ICD-10-CM | POA: Diagnosis not present

## 2017-06-22 DIAGNOSIS — M1712 Unilateral primary osteoarthritis, left knee: Secondary | ICD-10-CM | POA: Diagnosis not present

## 2017-06-22 DIAGNOSIS — M25562 Pain in left knee: Secondary | ICD-10-CM | POA: Diagnosis not present

## 2017-06-22 HISTORY — DX: Unspecified osteoarthritis, unspecified site: M19.90

## 2017-06-22 HISTORY — DX: Personal history of urinary calculi: Z87.442

## 2017-06-22 HISTORY — DX: Cardiac arrhythmia, unspecified: I49.9

## 2017-06-22 HISTORY — DX: Atherosclerotic heart disease of native coronary artery without angina pectoris: I25.10

## 2017-06-22 LAB — COMPREHENSIVE METABOLIC PANEL
ALK PHOS: 43 U/L (ref 38–126)
ALT: 25 U/L (ref 17–63)
AST: 27 U/L (ref 15–41)
Albumin: 4 g/dL (ref 3.5–5.0)
Anion gap: 10 (ref 5–15)
BUN: 15 mg/dL (ref 6–20)
CALCIUM: 9 mg/dL (ref 8.9–10.3)
CO2: 27 mmol/L (ref 22–32)
CREATININE: 0.87 mg/dL (ref 0.61–1.24)
Chloride: 103 mmol/L (ref 101–111)
GFR calc non Af Amer: >60 mL/min (ref >60)
GLUCOSE: 117 mg/dL — AB (ref 65–99)
Potassium: 3.5 mmol/L (ref 3.5–5.1)
SODIUM: 140 mmol/L (ref 135–145)
Total Bilirubin: 1.1 mg/dL (ref 0.3–1.2)
Total Protein: 6.9 g/dL (ref 6.5–8.1)

## 2017-06-22 LAB — URINALYSIS, ROUTINE W REFLEX MICROSCOPIC
Bilirubin Urine: NEGATIVE
Glucose, UA: NEGATIVE mg/dL
Hgb urine dipstick: NEGATIVE
Ketones, ur: NEGATIVE mg/dL
LEUKOCYTES UA: NEGATIVE
NITRITE: NEGATIVE
PROTEIN: NEGATIVE mg/dL
Specific Gravity, Urine: 1.008 (ref 1.005–1.030)
pH: 5 (ref 5.0–8.0)

## 2017-06-22 LAB — SURGICAL PCR SCREEN
MRSA, PCR: NEGATIVE
Staphylococcus aureus: NEGATIVE

## 2017-06-22 LAB — CBC
HCT: 46.6 % (ref 40.0–52.0)
HEMOGLOBIN: 15.5 g/dL (ref 13.0–18.0)
MCH: 30 pg (ref 26.0–34.0)
MCHC: 33.3 g/dL (ref 32.0–36.0)
MCV: 89.9 fL (ref 80.0–100.0)
Platelets: 213 10*3/uL (ref 150–440)
RBC: 5.18 MIL/uL (ref 4.40–5.90)
RDW: 13.2 % (ref 11.5–14.5)
WBC: 6.8 10*3/uL (ref 3.8–10.6)

## 2017-06-22 LAB — PROTIME-INR
INR: 1.01
Prothrombin Time: 13.2 seconds (ref 11.4–15.2)

## 2017-06-22 LAB — SEDIMENTATION RATE: SED RATE: 5 mm/h (ref 0–20)

## 2017-06-22 LAB — TYPE AND SCREEN
ABO/RH(D): O POS
Antibody Screen: NEGATIVE

## 2017-06-22 LAB — APTT: aPTT: 28 seconds (ref 24–36)

## 2017-06-22 LAB — C-REACTIVE PROTEIN: CRP: <0.8 mg/dL (ref <1.0)

## 2017-06-22 NOTE — Pre-Procedure Instructions (Signed)
Request for clearance sent to Dr. Ancil Boozer and Juluis Rainier to Dr. Clydell Hakim office

## 2017-06-22 NOTE — Patient Instructions (Signed)
Your procedure is scheduled on: Mon. 07/04/17 Report to Day Surgery. To find out your arrival time please call [phone] between 1PM - 3PM on Friday 07/02/17  Remember: Instructions that are not followed completely may result in serious medical risk, up to and including death, or upon the discretion of your surgeon and anesthesiologist your surgery may need to be rescheduled.     _X__ 1. Do not eat food after midnight the night before your procedure.                 No gum chewing or hard candies. You may drink clear liquids up to 2 hours                 before you are scheduled to arrive for your surgery- DO not drink clear                 liquids within 2 hours of the start of your surgery.                 Clear Liquids include:  water, apple juice without pulp, clear carbohydrate                 drink such as Clearfast of Gartorade, Black Coffee or Tea (Do not add                 anything to coffee or tea).     __ 2.  No Alcohol for 24 hours before or after surgery.   ___ 3.  Do Not Smoke or use e-cigarettes For 24 Hours Prior to Your Surgery.                 Do not use any chewable tobacco products for at least 6 hours prior to                 surgery.  ____  4.  Bring all medications with you on the day of surgery if instructed.   ____  5.  Notify your doctor if there is any change in your medical condition      (cold, fever, infections).     Do not wear jewelry, make-up, hairpins, clips or nail polish. Do not wear lotions, powders, or perfumes. You may wear deodorant. Do not shave 48 hours prior to surgery. Men may shave face and neck. Do not bring valuables to the hospital.    [facility] is not responsible for any belongings or valuables.  Contacts, dentures or bridgework may not be worn into surgery. Leave your suitcase in the car. After surgery it may be brought to your room. For patients admitted to the hospital, discharge time is determined by  your treatment team.   Patients discharged the day of surgery will not be allowed to drive home.   Please read over the following fact sheets that you were given:    _x___ Take these medicines the morning of surgery with A SIP OF WATER:    1. amLODipine (NORVASC) 5 MG tablet  2. finasteride (PROSCAR) 5 MG tablet  3. metoprolol tartrate (LOPRESSOR) 12.5 mg TABS tablet  4.potassium chloride (KLOR-CON) 8 MEQ tablet  5.ranitidine (ZANTAC) 150 MG tablet  6.  ____ Fleet Enema (as directed)   __x__ Use CHG Soap as directed  ____ Use inhalers on the day of surgery  ____ Stop metformin 2 days prior to surgery    ____ Take 1/2 of usual insulin dose the night before surgery. No insulin the  morning          of surgery.   _x___ Stop aspirin on 06/27/17  ____ Stop Anti-inflammatories on    _x___ Stop supplements until after surgery.  Turmeric 500 MG CAPS,Omega-3 Fatty Acids (FISH OIL) 1000 MG CAPS  ____ Bring C-Pap to the hospital.

## 2017-06-23 LAB — URINE CULTURE
CULTURE: NO GROWTH
SPECIAL REQUESTS: NORMAL

## 2017-06-27 DIAGNOSIS — M1712 Unilateral primary osteoarthritis, left knee: Secondary | ICD-10-CM | POA: Diagnosis not present

## 2017-06-27 DIAGNOSIS — M25562 Pain in left knee: Secondary | ICD-10-CM | POA: Diagnosis not present

## 2017-06-27 DIAGNOSIS — R531 Weakness: Secondary | ICD-10-CM | POA: Diagnosis not present

## 2017-06-27 DIAGNOSIS — R262 Difficulty in walking, not elsewhere classified: Secondary | ICD-10-CM | POA: Diagnosis not present

## 2017-06-28 ENCOUNTER — Telehealth: Payer: Self-pay | Admitting: Family Medicine

## 2017-06-28 NOTE — Telephone Encounter (Signed)
He has a history of paroxysmal afib and history of coronary bypass, must see cardiologist

## 2017-06-28 NOTE — Telephone Encounter (Signed)
Spoke with Darius Grimes from [place] and states she had no indication he would have to go to cardiology for clearance. She faxed over the paperwork on 06/22/17 and had not heard from [place] and wanted to check on status.

## 2017-06-28 NOTE — Telephone Encounter (Signed)
He needs cardiac clearance from his cardiologist

## 2017-06-28 NOTE — Telephone Encounter (Signed)
Copied from [place] [id number]. Topic: Quick Communication - See Telephone Encounter >> Jun 28, 2017  8:09 AM Robina Ade, Helene Kelp D wrote: CRM for notification. See Telephone encounter for: 06/28/17. Tiffany from [facility] called and said that patient has not been cleared for medical clearance for surgery on Nov. 5th and would like to know if he can be cleared before that date. She will resend via fax the medical clearance paper to be sign and return. If any question she can be reached at [phone].

## 2017-06-29 DIAGNOSIS — E785 Hyperlipidemia, unspecified: Secondary | ICD-10-CM | POA: Diagnosis not present

## 2017-06-29 DIAGNOSIS — Z01810 Encounter for preprocedural cardiovascular examination: Secondary | ICD-10-CM | POA: Diagnosis not present

## 2017-06-29 DIAGNOSIS — Z951 Presence of aortocoronary bypass graft: Secondary | ICD-10-CM | POA: Diagnosis not present

## 2017-06-29 DIAGNOSIS — I480 Paroxysmal atrial fibrillation: Secondary | ICD-10-CM | POA: Diagnosis not present

## 2017-06-30 DIAGNOSIS — M25562 Pain in left knee: Secondary | ICD-10-CM | POA: Diagnosis not present

## 2017-06-30 DIAGNOSIS — R531 Weakness: Secondary | ICD-10-CM | POA: Diagnosis not present

## 2017-06-30 DIAGNOSIS — R262 Difficulty in walking, not elsewhere classified: Secondary | ICD-10-CM | POA: Diagnosis not present

## 2017-06-30 DIAGNOSIS — M1712 Unilateral primary osteoarthritis, left knee: Secondary | ICD-10-CM | POA: Diagnosis not present

## 2017-07-01 NOTE — Pre-Procedure Instructions (Signed)
Cardiac clearance from Dr. Saralyn Pilar in front of chart. Low risk.

## 2017-07-03 MED ORDER — SODIUM CHLORIDE 0.9 % IV SOLN
1000.0000 mg | INTRAVENOUS | Status: AC
  Administered 2017-07-04: 1000 mg via INTRAVENOUS
  Filled 2017-07-03: qty 10

## 2017-07-03 MED ORDER — CEFAZOLIN SODIUM-DEXTROSE 2-4 GM/100ML-% IV SOLN
2.0000 g | INTRAVENOUS | Status: AC
  Administered 2017-07-04: 2 g via INTRAVENOUS

## 2017-07-04 ENCOUNTER — Inpatient Hospital Stay
Admission: RE | Admit: 2017-07-04 | Discharge: 2017-07-06 | DRG: 470 | Disposition: A | Discharge status: Home Health | Payer: Medicare HMO | Source: Ambulatory Visit | Attending: Orthopedic Surgery | Admitting: Orthopedic Surgery

## 2017-07-04 ENCOUNTER — Encounter
Admission: RE | Disposition: A | Discharge status: Home Health | Payer: Self-pay | Source: Ambulatory Visit | Attending: Orthopedic Surgery

## 2017-07-04 ENCOUNTER — Inpatient Hospital Stay: Payer: Medicare HMO

## 2017-07-04 ENCOUNTER — Encounter: Payer: Self-pay | Admitting: *Deleted

## 2017-07-04 ENCOUNTER — Other Ambulatory Visit: Payer: Self-pay

## 2017-07-04 ENCOUNTER — Inpatient Hospital Stay: Payer: Medicare HMO | Admitting: Anesthesiology

## 2017-07-04 DIAGNOSIS — I2510 Atherosclerotic heart disease of native coronary artery without angina pectoris: Secondary | ICD-10-CM | POA: Diagnosis present

## 2017-07-04 DIAGNOSIS — I10 Essential (primary) hypertension: Secondary | ICD-10-CM | POA: Diagnosis not present

## 2017-07-04 DIAGNOSIS — K219 Gastro-esophageal reflux disease without esophagitis: Secondary | ICD-10-CM | POA: Diagnosis not present

## 2017-07-04 DIAGNOSIS — Z951 Presence of aortocoronary bypass graft: Secondary | ICD-10-CM

## 2017-07-04 DIAGNOSIS — M1712 Unilateral primary osteoarthritis, left knee: Principal | ICD-10-CM | POA: Diagnosis present

## 2017-07-04 DIAGNOSIS — E785 Hyperlipidemia, unspecified: Secondary | ICD-10-CM | POA: Diagnosis present

## 2017-07-04 DIAGNOSIS — Z79899 Other long term (current) drug therapy: Secondary | ICD-10-CM

## 2017-07-04 DIAGNOSIS — Z87442 Personal history of urinary calculi: Secondary | ICD-10-CM | POA: Diagnosis not present

## 2017-07-04 DIAGNOSIS — Z96652 Presence of left artificial knee joint: Secondary | ICD-10-CM | POA: Diagnosis not present

## 2017-07-04 DIAGNOSIS — Z96659 Presence of unspecified artificial knee joint: Secondary | ICD-10-CM

## 2017-07-04 DIAGNOSIS — J309 Allergic rhinitis, unspecified: Secondary | ICD-10-CM | POA: Diagnosis present

## 2017-07-04 DIAGNOSIS — Z471 Aftercare following joint replacement surgery: Secondary | ICD-10-CM | POA: Diagnosis not present

## 2017-07-04 DIAGNOSIS — Z7982 Long term (current) use of aspirin: Secondary | ICD-10-CM | POA: Diagnosis not present

## 2017-07-04 DIAGNOSIS — I4891 Unspecified atrial fibrillation: Secondary | ICD-10-CM | POA: Diagnosis not present

## 2017-07-04 DIAGNOSIS — I480 Paroxysmal atrial fibrillation: Secondary | ICD-10-CM | POA: Diagnosis present

## 2017-07-04 HISTORY — PX: KNEE ARTHROPLASTY: SHX992

## 2017-07-04 LAB — ABO/RH: ABO/RH(D): O POS

## 2017-07-04 SURGERY — ARTHROPLASTY, KNEE, TOTAL, USING IMAGELESS COMPUTER-ASSISTED NAVIGATION
Anesthesia: Spinal | Site: Knee | Laterality: Left | Wound class: Clean

## 2017-07-04 MED ORDER — FENTANYL CITRATE (PF) 100 MCG/2ML IJ SOLN
INTRAMUSCULAR | Status: AC
  Filled 2017-07-04: qty 2

## 2017-07-04 MED ORDER — POTASSIUM CHLORIDE ER 8 MEQ PO TBCR
16.0000 meq | EXTENDED_RELEASE_TABLET | Freq: Two times a day (BID) | ORAL | Status: DC
  Administered 2017-07-04 – 2017-07-06 (×4): 16 meq via ORAL
  Filled 2017-07-04 (×4): qty 2

## 2017-07-04 MED ORDER — VITAMIN D 1000 UNITS PO TABS
5000.0000 [IU] | ORAL_TABLET | Freq: Two times a day (BID) | ORAL | Status: DC
  Administered 2017-07-05 – 2017-07-06 (×3): 5000 [IU] via ORAL
  Filled 2017-07-04 (×3): qty 5

## 2017-07-04 MED ORDER — MIDAZOLAM HCL 2 MG/2ML IJ SOLN
INTRAMUSCULAR | Status: AC
  Filled 2017-07-04: qty 2

## 2017-07-04 MED ORDER — SODIUM CHLORIDE 0.9 % IJ SOLN
INTRAMUSCULAR | Status: AC
  Filled 2017-07-04: qty 50

## 2017-07-04 MED ORDER — ADULT MULTIVITAMIN W/MINERALS CH
1.0000 | ORAL_TABLET | Freq: Every day | ORAL | Status: DC
  Administered 2017-07-05 – 2017-07-06 (×2): 1 via ORAL
  Filled 2017-07-04 (×2): qty 1

## 2017-07-04 MED ORDER — TRANEXAMIC ACID 1000 MG/10ML IV SOLN
1000.0000 mg | Freq: Once | INTRAVENOUS | Status: AC
  Administered 2017-07-04: 1000 mg via INTRAVENOUS
  Filled 2017-07-04 (×2): qty 10

## 2017-07-04 MED ORDER — OMEGA-3-ACID ETHYL ESTERS 1 G PO CAPS
1.0000 g | ORAL_CAPSULE | Freq: Every day | ORAL | Status: DC
  Administered 2017-07-05 – 2017-07-06 (×2): 1 g via ORAL
  Filled 2017-07-04 (×2): qty 1

## 2017-07-04 MED ORDER — BUPIVACAINE HCL (PF) 0.5 % IJ SOLN
INTRAMUSCULAR | Status: DC | PRN
  Administered 2017-07-04: 2.7 mL

## 2017-07-04 MED ORDER — SODIUM CHLORIDE 0.9 % IV SOLN
INTRAVENOUS | Status: DC | PRN
  Administered 2017-07-04: 60 mL

## 2017-07-04 MED ORDER — ALUM & MAG HYDROXIDE-SIMETH 200-200-20 MG/5ML PO SUSP: 30.0000 mL | ORAL | Status: DC | PRN

## 2017-07-04 MED ORDER — PHENYLEPHRINE HCL 10 MG/ML IJ SOLN
INTRAMUSCULAR | Status: AC
  Filled 2017-07-04: qty 1

## 2017-07-04 MED ORDER — PRAVASTATIN SODIUM 20 MG PO TABS
20.0000 mg | ORAL_TABLET | Freq: Every day | ORAL | Status: DC
  Administered 2017-07-05: 20 mg via ORAL
  Filled 2017-07-04 (×2): qty 1

## 2017-07-04 MED ORDER — ACETAMINOPHEN 650 MG RE SUPP: 650.0000 mg | RECTAL | Status: DC | PRN

## 2017-07-04 MED ORDER — SODIUM CHLORIDE 0.9 % IV SOLN
INTRAVENOUS | Status: DC | PRN
  Administered 2017-07-04: 40 ug/min via INTRAVENOUS

## 2017-07-04 MED ORDER — PROPOFOL 10 MG/ML IV BOLUS
INTRAVENOUS | Status: DC | PRN
  Administered 2017-07-04 (×2): 20 mg via INTRAVENOUS
  Administered 2017-07-04: 30 mg via INTRAVENOUS

## 2017-07-04 MED ORDER — BUPIVACAINE LIPOSOME 1.3 % IJ SUSP
INTRAMUSCULAR | Status: AC
  Filled 2017-07-04: qty 20

## 2017-07-04 MED ORDER — PROPOFOL 500 MG/50ML IV EMUL
INTRAVENOUS | Status: AC
  Filled 2017-07-04: qty 50

## 2017-07-04 MED ORDER — PHENOL 1.4 % MT LIQD
1.0000 | OROMUCOSAL | Status: DC | PRN
  Filled 2017-07-04: qty 177

## 2017-07-04 MED ORDER — DEXTROSE 5 % IV SOLN
2.0000 g | Freq: Four times a day (QID) | INTRAVENOUS | Status: AC
  Administered 2017-07-04 – 2017-07-05 (×4): 2 g via INTRAVENOUS
  Filled 2017-07-04 (×4): qty 2000

## 2017-07-04 MED ORDER — METOCLOPRAMIDE HCL 10 MG PO TABS
10.0000 mg | ORAL_TABLET | Freq: Three times a day (TID) | ORAL | Status: DC
  Administered 2017-07-04 – 2017-07-06 (×6): 10 mg via ORAL
  Filled 2017-07-04 (×7): qty 1

## 2017-07-04 MED ORDER — ACETAMINOPHEN 10 MG/ML IV SOLN
INTRAVENOUS | Status: DC | PRN
  Administered 2017-07-04: 1000 mg via INTRAVENOUS

## 2017-07-04 MED ORDER — BUPIVACAINE HCL (PF) 0.25 % IJ SOLN
INTRAMUSCULAR | Status: DC | PRN
  Administered 2017-07-04: 60 mL

## 2017-07-04 MED ORDER — PANTOPRAZOLE SODIUM 40 MG PO TBEC
40.0000 mg | DELAYED_RELEASE_TABLET | Freq: Two times a day (BID) | ORAL | Status: DC
  Administered 2017-07-05 – 2017-07-06 (×3): 40 mg via ORAL
  Filled 2017-07-04 (×4): qty 1

## 2017-07-04 MED ORDER — FENTANYL CITRATE (PF) 100 MCG/2ML IJ SOLN
INTRAMUSCULAR | Status: DC | PRN
  Administered 2017-07-04: 100 ug via INTRAVENOUS

## 2017-07-04 MED ORDER — LACTATED RINGERS IV SOLN
INTRAVENOUS | Status: DC
  Administered 2017-07-04 (×2): via INTRAVENOUS

## 2017-07-04 MED ORDER — FENTANYL CITRATE (PF) 100 MCG/2ML IJ SOLN
INTRAMUSCULAR | Status: AC
  Administered 2017-07-04: 25 ug via INTRAVENOUS
  Filled 2017-07-04: qty 2

## 2017-07-04 MED ORDER — MENTHOL 3 MG MT LOZG
1.0000 | LOZENGE | OROMUCOSAL | Status: DC | PRN
  Filled 2017-07-04: qty 9

## 2017-07-04 MED ORDER — MORPHINE SULFATE (PF) 2 MG/ML IV SOLN: 2.0000 mg | INTRAVENOUS | Status: DC | PRN

## 2017-07-04 MED ORDER — FINASTERIDE 5 MG PO TABS
5.0000 mg | ORAL_TABLET | Freq: Every day | ORAL | Status: DC
  Administered 2017-07-05 – 2017-07-06 (×2): 5 mg via ORAL
  Filled 2017-07-04 (×2): qty 1

## 2017-07-04 MED ORDER — LIDOCAINE HCL (PF) 2 % IJ SOLN
INTRAMUSCULAR | Status: AC
  Filled 2017-07-04: qty 10

## 2017-07-04 MED ORDER — FUROSEMIDE 20 MG PO TABS
20.0000 mg | ORAL_TABLET | Freq: Two times a day (BID) | ORAL | Status: DC
  Administered 2017-07-04 – 2017-07-06 (×4): 20 mg via ORAL
  Filled 2017-07-04 (×4): qty 1

## 2017-07-04 MED ORDER — ACETAMINOPHEN 325 MG PO TABS: 650.0000 mg | ORAL_TABLET | ORAL | Status: DC | PRN

## 2017-07-04 MED ORDER — FLUTICASONE PROPIONATE 50 MCG/ACT NA SUSP
2.0000 | Freq: Every day | NASAL | Status: DC
  Administered 2017-07-05: 2 via NASAL
  Filled 2017-07-04: qty 16

## 2017-07-04 MED ORDER — ONDANSETRON HCL 4 MG PO TABS: 4.0000 mg | ORAL_TABLET | Freq: Four times a day (QID) | ORAL | Status: DC | PRN

## 2017-07-04 MED ORDER — TRAMADOL HCL 50 MG PO TABS
50.0000 mg | ORAL_TABLET | ORAL | Status: DC | PRN
  Administered 2017-07-05: 100 mg via ORAL
  Filled 2017-07-04: qty 2

## 2017-07-04 MED ORDER — MAGNESIUM HYDROXIDE 400 MG/5ML PO SUSP: 30.0000 mL | Freq: Every day | ORAL | Status: DC | PRN

## 2017-07-04 MED ORDER — FLEET ENEMA 7-19 GM/118ML RE ENEM: 1.0000 | ENEMA | Freq: Once | RECTAL | Status: DC | PRN

## 2017-07-04 MED ORDER — ONDANSETRON HCL 4 MG/2ML IJ SOLN: 4.0000 mg | Freq: Four times a day (QID) | INTRAMUSCULAR | Status: DC | PRN

## 2017-07-04 MED ORDER — SODIUM CHLORIDE 0.9 % IV SOLN
INTRAVENOUS | Status: DC
  Administered 2017-07-04: 16:00:00 via INTRAVENOUS

## 2017-07-04 MED ORDER — NEOMYCIN-POLYMYXIN B GU 40-200000 IR SOLN
Status: DC | PRN
  Administered 2017-07-04: 14 mL

## 2017-07-04 MED ORDER — DIPHENHYDRAMINE HCL 12.5 MG/5ML PO ELIX: 12.5000 mg | ORAL_SOLUTION | ORAL | Status: DC | PRN

## 2017-07-04 MED ORDER — AMLODIPINE BESYLATE 5 MG PO TABS
5.0000 mg | ORAL_TABLET | Freq: Two times a day (BID) | ORAL | Status: DC
  Administered 2017-07-05 – 2017-07-06 (×4): 5 mg via ORAL
  Filled 2017-07-04 (×5): qty 1

## 2017-07-04 MED ORDER — ACETAMINOPHEN 10 MG/ML IV SOLN
1000.0000 mg | Freq: Four times a day (QID) | INTRAVENOUS | Status: AC
  Administered 2017-07-04 – 2017-07-05 (×4): 1000 mg via INTRAVENOUS
  Filled 2017-07-04 (×5): qty 100

## 2017-07-04 MED ORDER — CHLORHEXIDINE GLUCONATE 4 % EX LIQD: 60.0000 mL | Freq: Once | CUTANEOUS | Status: DC

## 2017-07-04 MED ORDER — TAMSULOSIN HCL 0.4 MG PO CAPS
0.4000 mg | ORAL_CAPSULE | Freq: Every day | ORAL | Status: DC
  Administered 2017-07-04 – 2017-07-05 (×2): 0.4 mg via ORAL
  Filled 2017-07-04 (×2): qty 1

## 2017-07-04 MED ORDER — OXYCODONE HCL 5 MG/5ML PO SOLN: 5.0000 mg | Freq: Once | ORAL | Status: DC | PRN

## 2017-07-04 MED ORDER — LISINOPRIL 20 MG PO TABS
40.0000 mg | ORAL_TABLET | Freq: Every day | ORAL | Status: DC
  Administered 2017-07-05 – 2017-07-06 (×2): 40 mg via ORAL
  Filled 2017-07-04 (×2): qty 2

## 2017-07-04 MED ORDER — DEXAMETHASONE SODIUM PHOSPHATE 4 MG/ML IJ SOLN
INTRAMUSCULAR | Status: DC | PRN
  Administered 2017-07-04: 5 mg via INTRAVENOUS

## 2017-07-04 MED ORDER — SENNOSIDES-DOCUSATE SODIUM 8.6-50 MG PO TABS
1.0000 | ORAL_TABLET | Freq: Two times a day (BID) | ORAL | Status: DC
  Administered 2017-07-04 – 2017-07-06 (×4): 1 via ORAL
  Filled 2017-07-04 (×5): qty 1

## 2017-07-04 MED ORDER — PROPOFOL 500 MG/50ML IV EMUL
INTRAVENOUS | Status: DC | PRN
  Administered 2017-07-04: 70 ug/kg/min via INTRAVENOUS

## 2017-07-04 MED ORDER — ENOXAPARIN SODIUM 30 MG/0.3ML ~~LOC~~ SOLN
30.0000 mg | Freq: Two times a day (BID) | SUBCUTANEOUS | Status: DC
  Administered 2017-07-05 – 2017-07-06 (×3): 30 mg via SUBCUTANEOUS
  Filled 2017-07-04 (×3): qty 0.3

## 2017-07-04 MED ORDER — NEOMYCIN-POLYMYXIN B GU 40-200000 IR SOLN
Status: AC
  Filled 2017-07-04: qty 20

## 2017-07-04 MED ORDER — FERROUS SULFATE 325 (65 FE) MG PO TABS
325.0000 mg | ORAL_TABLET | Freq: Two times a day (BID) | ORAL | Status: DC
  Administered 2017-07-04 – 2017-07-06 (×4): 325 mg via ORAL
  Filled 2017-07-04 (×4): qty 1

## 2017-07-04 MED ORDER — CEFAZOLIN SODIUM-DEXTROSE 2-4 GM/100ML-% IV SOLN
2.0000 g | Freq: Four times a day (QID) | INTRAVENOUS | Status: DC
  Filled 2017-07-04 (×3): qty 100

## 2017-07-04 MED ORDER — MIDAZOLAM HCL 5 MG/5ML IJ SOLN
INTRAMUSCULAR | Status: DC | PRN
  Administered 2017-07-04: 1 mg via INTRAVENOUS

## 2017-07-04 MED ORDER — DEXAMETHASONE SODIUM PHOSPHATE 10 MG/ML IJ SOLN
INTRAMUSCULAR | Status: AC
  Filled 2017-07-04: qty 1

## 2017-07-04 MED ORDER — TETRACAINE HCL 1 % IJ SOLN
INTRAMUSCULAR | Status: DC | PRN
  Administered 2017-07-04: 3 mg via INTRASPINAL

## 2017-07-04 MED ORDER — BISACODYL 10 MG RE SUPP: 10.0000 mg | Freq: Every day | RECTAL | Status: DC | PRN

## 2017-07-04 MED ORDER — MIRABEGRON ER 25 MG PO TB24
25.0000 mg | ORAL_TABLET | Freq: Every day | ORAL | Status: DC
  Administered 2017-07-05 – 2017-07-06 (×2): 25 mg via ORAL
  Filled 2017-07-04 (×3): qty 1

## 2017-07-04 MED ORDER — BUPIVACAINE HCL (PF) 0.25 % IJ SOLN
INTRAMUSCULAR | Status: AC
  Filled 2017-07-04: qty 60

## 2017-07-04 MED ORDER — METOPROLOL TARTRATE 25 MG PO TABS
12.5000 mg | ORAL_TABLET | Freq: Two times a day (BID) | ORAL | Status: DC
  Administered 2017-07-05 – 2017-07-06 (×4): 12.5 mg via ORAL
  Filled 2017-07-04 (×4): qty 1

## 2017-07-04 MED ORDER — CELECOXIB 200 MG PO CAPS
200.0000 mg | ORAL_CAPSULE | Freq: Two times a day (BID) | ORAL | Status: DC
  Administered 2017-07-04 – 2017-07-06 (×4): 200 mg via ORAL
  Filled 2017-07-04 (×4): qty 1

## 2017-07-04 MED ORDER — OXYCODONE HCL 5 MG PO TABS
10.0000 mg | ORAL_TABLET | ORAL | Status: DC | PRN
  Administered 2017-07-06 (×4): 10 mg via ORAL
  Filled 2017-07-04 (×4): qty 2

## 2017-07-04 MED ORDER — GLYCOPYRROLATE 0.2 MG/ML IJ SOLN
INTRAMUSCULAR | Status: DC | PRN
  Administered 2017-07-04: 0.2 mg via INTRAVENOUS

## 2017-07-04 MED ORDER — OXYCODONE HCL 5 MG PO TABS: 5.0000 mg | ORAL_TABLET | Freq: Once | ORAL | Status: DC | PRN

## 2017-07-04 MED ORDER — KETAMINE HCL 50 MG/ML IJ SOLN
INTRAMUSCULAR | Status: AC
  Filled 2017-07-04: qty 10

## 2017-07-04 MED ORDER — KETAMINE HCL 50 MG/ML IJ SOLN
INTRAMUSCULAR | Status: DC | PRN
  Administered 2017-07-04: 50 mg via INTRAMUSCULAR
  Administered 2017-07-04: 10 mg via INTRAMUSCULAR
  Administered 2017-07-04: 15 mg via INTRAMUSCULAR

## 2017-07-04 MED ORDER — VITAMIN B-12 1000 MCG PO TABS
1000.0000 ug | ORAL_TABLET | Freq: Every day | ORAL | Status: DC
  Administered 2017-07-05 – 2017-07-06 (×2): 1000 ug via ORAL
  Filled 2017-07-04 (×2): qty 1

## 2017-07-04 MED ORDER — CEFAZOLIN SODIUM-DEXTROSE 2-4 GM/100ML-% IV SOLN
INTRAVENOUS | Status: AC
  Filled 2017-07-04: qty 100

## 2017-07-04 MED ORDER — ACETAMINOPHEN 10 MG/ML IV SOLN
INTRAVENOUS | Status: AC
  Filled 2017-07-04: qty 100

## 2017-07-04 MED ORDER — GLYCOPYRROLATE 0.2 MG/ML IJ SOLN
INTRAMUSCULAR | Status: AC
  Filled 2017-07-04: qty 1

## 2017-07-04 MED ORDER — FENTANYL CITRATE (PF) 100 MCG/2ML IJ SOLN
25.0000 ug | INTRAMUSCULAR | Status: DC | PRN
  Administered 2017-07-04 (×2): 25 ug via INTRAVENOUS
  Administered 2017-07-04: 50 ug via INTRAVENOUS

## 2017-07-04 MED ORDER — OXYCODONE HCL 5 MG PO TABS
5.0000 mg | ORAL_TABLET | ORAL | Status: DC | PRN
  Administered 2017-07-04 – 2017-07-05 (×10): 5 mg via ORAL
  Filled 2017-07-04 (×11): qty 1

## 2017-07-04 SURGICAL SUPPLY — 66 items
BATTERY INSTRU NAVIGATION (MISCELLANEOUS) ×8 IMPLANT
BLADE CLIPPER SURG (BLADE) ×2 IMPLANT
BLADE SAW 1 (BLADE) ×2 IMPLANT
BLADE SAW 1/2 (BLADE) ×2 IMPLANT
BLADE SAW 70X12.5 (BLADE) IMPLANT
CANISTER SUCT 1200ML W/VALVE (MISCELLANEOUS) ×2 IMPLANT
CANISTER SUCT 3000ML PPV (MISCELLANEOUS) ×4 IMPLANT
CAPT KNEE TOTAL 3 ATTUNE ×2 IMPLANT
CATH TRAY METER 16FR LF (MISCELLANEOUS) ×2 IMPLANT
CEMENT HV SMART SET (Cement) ×4 IMPLANT
COOLER POLAR GLACIER W/PUMP (MISCELLANEOUS) ×2 IMPLANT
CUFF TOURN 24 STER (MISCELLANEOUS) IMPLANT
CUFF TOURN 30 STER DUAL PORT (MISCELLANEOUS) ×2 IMPLANT
DRAPE SHEET LG 3/4 BI-LAMINATE (DRAPES) ×2 IMPLANT
DRSG DERMACEA 8X12 NADH (GAUZE/BANDAGES/DRESSINGS) ×2 IMPLANT
DRSG OPSITE POSTOP 4X14 (GAUZE/BANDAGES/DRESSINGS) ×2 IMPLANT
DRSG TEGADERM 4X4.75 (GAUZE/BANDAGES/DRESSINGS) ×2 IMPLANT
DURAPREP 26ML APPLICATOR (WOUND CARE) ×4 IMPLANT
ELECT CAUTERY BLADE 6.4 (BLADE) ×2 IMPLANT
ELECT REM PT RETURN 9FT ADLT (ELECTROSURGICAL) ×2
ELECTRODE REM PT RTRN 9FT ADLT (ELECTROSURGICAL) ×1 IMPLANT
EVACUATOR 1/8 PVC DRAIN (DRAIN) ×2 IMPLANT
EX-PIN ORTHOLOCK NAV 4X150 (PIN) ×4 IMPLANT
GLOVE BIO SURGEON STRL SZ7 (GLOVE) ×4 IMPLANT
GLOVE BIOGEL M STRL SZ7.5 (GLOVE) ×4 IMPLANT
GLOVE BIOGEL PI IND STRL 7.5 (GLOVE) ×6 IMPLANT
GLOVE BIOGEL PI IND STRL 9 (GLOVE) ×1 IMPLANT
GLOVE BIOGEL PI INDICATOR 7.5 (GLOVE) ×6
GLOVE BIOGEL PI INDICATOR 9 (GLOVE) ×1
GLOVE INDICATOR 8.0 STRL GRN (GLOVE) ×2 IMPLANT
GLOVE SURG SYN 9.0  PF PI (GLOVE) ×1
GLOVE SURG SYN 9.0 PF PI (GLOVE) ×1 IMPLANT
GOWN STRL REUS W/ TWL LRG LVL3 (GOWN DISPOSABLE) ×3 IMPLANT
GOWN STRL REUS W/TWL 2XL LVL3 (GOWN DISPOSABLE) ×2 IMPLANT
GOWN STRL REUS W/TWL LRG LVL3 (GOWN DISPOSABLE) ×3
HOLDER FOLEY CATH W/STRAP (MISCELLANEOUS) ×2 IMPLANT
HOOD PEEL AWAY FLYTE STAYCOOL (MISCELLANEOUS) ×4 IMPLANT
KIT RM TURNOVER STRD PROC AR (KITS) ×2 IMPLANT
KNIFE SCULPS 14X20 (INSTRUMENTS) ×2 IMPLANT
LABEL OR SOLS (LABEL) ×2 IMPLANT
NDL SAFETY 18GX1.5 (NEEDLE) ×2 IMPLANT
NEEDLE SPNL 20GX3.5 QUINCKE YW (NEEDLE) ×4 IMPLANT
NS IRRIG 500ML POUR BTL (IV SOLUTION) ×2 IMPLANT
PACK TOTAL KNEE (MISCELLANEOUS) ×2 IMPLANT
PAD WRAPON POLAR KNEE (MISCELLANEOUS) ×1 IMPLANT
PIN DRILL QUICK PACK ×2 IMPLANT
PIN FIXATION 1/8DIA X 3INL (PIN) ×2 IMPLANT
PULSAVAC PLUS IRRIG FAN TIP (DISPOSABLE) ×2
SOL .9 NS 3000ML IRR  AL (IV SOLUTION) ×1
SOL .9 NS 3000ML IRR UROMATIC (IV SOLUTION) ×1 IMPLANT
SOL PREP PVP 2OZ (MISCELLANEOUS) ×2
SOLUTION PREP PVP 2OZ (MISCELLANEOUS) ×1 IMPLANT
SPONGE DRAIN TRACH 4X4 STRL 2S (GAUZE/BANDAGES/DRESSINGS) ×2 IMPLANT
STAPLER SKIN PROX 35W (STAPLE) ×2 IMPLANT
STRAP TIBIA SHORT (MISCELLANEOUS) ×2 IMPLANT
SUCTION FRAZIER HANDLE 10FR (MISCELLANEOUS) ×1
SUCTION TUBE FRAZIER 10FR DISP (MISCELLANEOUS) ×1 IMPLANT
SUT VIC AB 0 CT1 36 (SUTURE) ×2 IMPLANT
SUT VIC AB 1 CT1 36 (SUTURE) ×4 IMPLANT
SUT VIC AB 2-0 CT2 27 (SUTURE) ×2 IMPLANT
SYR 20CC LL (SYRINGE) ×2 IMPLANT
SYR 30ML LL (SYRINGE) ×4 IMPLANT
TIP FAN IRRIG PULSAVAC PLUS (DISPOSABLE) ×1 IMPLANT
TOWEL OR 17X26 4PK STRL BLUE (TOWEL DISPOSABLE) ×2 IMPLANT
TOWER CARTRIDGE SMART MIX (DISPOSABLE) ×2 IMPLANT
WRAPON POLAR PAD KNEE (MISCELLANEOUS) ×2

## 2017-07-04 NOTE — Transfer of Care (Signed)
Immediate Anesthesia Transfer of Care Note  Patient: Darius Grimes  Procedure(s) Performed: COMPUTER ASSISTED TOTAL KNEE ARTHROPLASTY (Left Knee)  Patient Location: PACU  Anesthesia Type:Spinal  Level of Consciousness: drowsy and patient cooperative  Airway & Oxygen Therapy: Patient Spontanous Breathing and Patient connected to nasal cannula oxygen  Post-op Assessment: Report given to RN and Post -op Vital signs reviewed and stable  Post vital signs: Reviewed and stable  Last Vitals:  Vitals:   07/04/17 0602  BP: (!) 159/86  Pulse: (!) 56  Resp: 18  Temp: (!) 36.3 C  SpO2: 96%    Last Pain:  Vitals:   07/04/17 0602  TempSrc: Tympanic  PainSc: 3          Complications: No apparent anesthesia complications

## 2017-07-04 NOTE — Evaluation (Signed)
Physical Therapy Evaluation Patient Details Name: Darius Grimes MRN: [id number] DOB: 1946-02-18 Today's Date: 07/04/2017   History of Present Illness  Pt is a 71 y.o. male s/p L TKA secondary to degenerative arthrosis 07/04/17.  PMH includes htn, CAD, CABG, a-fib, neck pain, throat surgery, and CTR.  Clinical Impression  Prior to hospital admission, pt was independent.  Pt lives with his wife in 1 level home with steps to enter.  Currently pt is CGA supine to sit, min assist to stand with RW, and CGA to ambulate a few steps bed to recliner with RW.  Pt able to perform x10 L LE SLR independently so no KI utilized.  Pt overall tolerated session well (minimal to no pain during session).  Pt would benefit from skilled PT to address noted impairments and functional limitations (see below for any additional details).  Upon hospital discharge, recommend pt discharge to home with HHPT.    Follow Up Recommendations Home health PT    Equipment Recommendations  Rolling walker with 5" wheels    Recommendations for Other Services       Precautions / Restrictions Precautions Precautions: Knee;Fall Precaution Booklet Issued: Yes (comment) Required Braces or Orthoses: Knee Immobilizer - Left Knee Immobilizer - Right: Discontinue once straight leg raise with < 10 degree lag Restrictions Weight Bearing Restrictions: Yes LLE Weight Bearing: Weight bearing as tolerated      Mobility  Bed Mobility Overal bed mobility: Needs Assistance Bed Mobility: Supine to Sit     Supine to sit: Min guard;HOB elevated     General bed mobility comments: increased effort to perform; CGA for safety; vc's for technique  Transfers Overall transfer level: Needs assistance Equipment used: Rolling walker (2 wheeled) Transfers: Sit to/from Stand Sit to Stand: Min assist         General transfer comment: assist to initiate stand; vc's for UE and LE positioning and walker use  Ambulation/Gait Ambulation/Gait  assistance: Min guard Ambulation Distance (Feet): 3 Feet(bed to recliner) Assistive device: Rolling walker (2 wheeled)   Gait velocity: decreased   General Gait Details: decreased stance time L LE; vc's to increase UE support through RW; vc's for technique/walker use  Stairs            Wheelchair Mobility    Modified Rankin (Stroke Patients Only)       Balance Overall balance assessment: Needs assistance Sitting-balance support: No upper extremity supported;Feet supported Sitting balance-Leahy Scale: Good Sitting balance - Comments: sitting reaching within BOS     Standing balance-Leahy Scale: Poor Standing balance comment: requires at least single UE support for static standing balance (pt stood from chair 1x d/t feeling he couldn't urinate via catheter although it was appearing to function properly and pt shown that it was working)                             Pertinent Vitals/Pain Pain Assessment: No/denies pain  Vitals (HR, O2 on room air, and BP) stable and WFL throughout treatment session.    Home Living Family/patient expects to be discharged to:: Private residence Living Arrangements: Spouse/significant other Available Help at Discharge: Family Type of Home: House Home Access: Stairs to enter   [employer] of Steps: 2 steps with B pillars to hold onto Home Layout: One level Home Equipment: Grab bars - tub/shower;Walker - 2 wheels;Cane - single point      Prior Function Level of Independence: Independent  Comments: Pt denies any falls in past 6 months.     Hand Dominance        Extremity/Trunk Assessment   Upper Extremity Assessment Upper Extremity Assessment: Overall WFL for tasks assessed    Lower Extremity Assessment Lower Extremity Assessment: (R LE WFL; L LE at least 3/5 DF/PF; good L quad set; able to perform x10 L LE SLR independently)       Communication   Communication: HOH  Cognition  Arousal/Alertness: Awake/alert Behavior During Therapy: WFL for tasks assessed/performed Overall Cognitive Status: Within Functional Limits for tasks assessed                                        General Comments General comments (skin integrity, edema, etc.): L LE dressings and hemovac in place; foley catheter in place.  Nursing cleared pt for participation in physical therapy.  Pt agreeable to PT session.  Pt's wife present during session.    Exercises Total Joint Exercises Ankle Circles/Pumps: AROM;Strengthening;Both;10 reps;Supine Quad Sets: AROM;Strengthening;Both;10 reps;Supine Short Arc Quad: AROM;Strengthening;Left;10 reps;Supine Heel Slides: AAROM;Strengthening;Left;10 reps;Supine Hip ABduction/ADduction: AAROM;Strengthening;Left;10 reps;Supine Straight Leg Raises: AROM;Strengthening;Left;10 reps;Supine Goniometric ROM: L knee extension 6 degrees short of neutral semi-supine in bed; L knee flexion 85 degrees AROM sitting in recliner   Assessment/Plan    PT Assessment Patient needs continued PT services  PT Problem List Decreased strength;Decreased range of motion;Decreased activity tolerance;Decreased balance;Decreased mobility;Decreased knowledge of use of DME;Decreased knowledge of precautions;Pain       PT Treatment Interventions DME instruction;Gait training;Stair training;Functional mobility training;Therapeutic activities;Therapeutic exercise;Balance training;Patient/family education    PT Goals (Current goals can be found in the Care Plan section)  Acute Rehab PT Goals Patient Stated Goal: to go home PT Goal Formulation: With patient/family Time For Goal Achievement: 07/18/17 Potential to Achieve Goals: Good    Frequency BID   Barriers to discharge        Co-evaluation               AM-PAC PT "6 Clicks" Daily Activity  Outcome Measure Difficulty turning over in bed (including adjusting bedclothes, sheets and blankets)?: A  Little Difficulty moving from lying on back to sitting on the side of the bed? : A Little Difficulty sitting down on and standing up from a chair with arms (e.g., wheelchair, bedside commode, etc,.)?: Unable Help needed moving to and from a bed to chair (including a wheelchair)?: A Little Help needed walking in hospital room?: A Little Help needed climbing 3-5 steps with a railing? : A Lot 6 Click Score: 15    End of Session Equipment Utilized During Treatment: Gait belt Activity Tolerance: Patient tolerated treatment well;No increased pain Patient left: in chair;with call bell/phone within reach;with chair alarm set;with nursing/sitter in room;with family/visitor present;with SCD's reapplied(B heels elevated via towel rolls; polar care in place and activated) Nurse Communication: Mobility status;Precautions;Weight bearing status PT Visit Diagnosis: Other abnormalities of gait and mobility (R26.89);Muscle weakness (generalized) (M62.81);Pain Pain - Right/Left: Left Pain - part of body: Knee    Time: 1530-1620 PT Time Calculation (min) (ACUTE ONLY): 50 min   Charges:   PT Evaluation $PT Eval Low Complexity: 1 Low PT Treatments $Therapeutic Exercise: 8-22 mins $Therapeutic Activity: 8-22 mins   PT G Codes:   PT G-Codes **NOT FOR INPATIENT CLASS** Functional Assessment Tool Used: AM-PAC 6 Clicks Basic Mobility Functional Limitation: Mobility: Walking and moving around Mobility: Walking  and Moving Around Current Status [phone]): At least 40 percent but less than 60 percent impaired, limited or restricted Mobility: Walking and Moving Around Goal Status [phone]): At least 1 percent but less than 20 percent impaired, limited or restricted     Leitha Bleak, PT 07/04/17, 4:51 PM [phone]

## 2017-07-04 NOTE — Anesthesia Procedure Notes (Signed)
Spinal  Patient location during procedure: OR Start time: 07/04/2017 7:22 AM End time: 07/04/2017 7:30 AM Staffing Performed: resident/CRNA  Preanesthetic Checklist Completed: patient identified, site marked, surgical consent, pre-op evaluation, timeout performed, IV checked, risks and benefits discussed and monitors and equipment checked Spinal Block Patient position: sitting Prep: ChloraPrep Patient monitoring: heart rate, continuous pulse ox, blood pressure and cardiac monitor Approach: midline Location: L4-5 Injection technique: single-shot Needle Needle type: Introducer and Pencan  Needle gauge: 24 G Needle length: 9 cm Additional Notes Negative paresthesia. Negative blood return. Positive free-flowing CSF. Expiration date of kit checked and confirmed. Patient tolerated procedure well, without complications.

## 2017-07-04 NOTE — Op Note (Signed)
OPERATIVE NOTE  DATE OF SURGERY:  07/04/2017  PATIENT NAME:  Darius Grimes   DOB: 1946/06/20  MRN: [id number]  PRE-OPERATIVE DIAGNOSIS: Degenerative arthrosis of the left knee, primary  POST-OPERATIVE DIAGNOSIS:  Same  PROCEDURE:  Left total knee arthroplasty using computer-assisted navigation  SURGEON:  Marciano Sequin. M.D.  ASSISTANT:  Vance Peper, PA (present and scrubbed throughout the case, critical for assistance with exposure, retraction, instrumentation, and closure)  ANESTHESIA: spinal  ESTIMATED BLOOD LOSS: 50 mL  FLUIDS REPLACED: 1200 mL of crystalloid  TOURNIQUET TIME: 90 minutes  DRAINS: 2 medium Hemovac drains  SOFT TISSUE RELEASES: Anterior cruciate ligament, posterior cruciate ligament, deep medial collateral ligament, patellofemoral ligament  IMPLANTS UTILIZED: DePuy Attune size 6 posterior stabilized femoral component (cemented), size 8 rotating platform tibial component (cemented), 41 mm medialized dome patella (cemented), and a 5 mm stabilized rotating platform polyethylene insert.  INDICATIONS FOR SURGERY: Darius Grimes is a 71 y.o. year old male with a long history of progressive knee pain. X-rays demonstrated severe degenerative changes in tricompartmental fashion. The patient had not seen any significant improvement despite conservative nonsurgical intervention. After discussion of the risks and benefits of surgical intervention, the patient expressed understanding of the risks benefits and agree with plans for total knee arthroplasty.   The risks, benefits, and alternatives were discussed at length including but not limited to the risks of infection, bleeding, nerve injury, stiffness, blood clots, the need for revision surgery, cardiopulmonary complications, among others, and they were willing to proceed.  PROCEDURE IN DETAIL: The patient was brought into the operating room and, after adequate spinal anesthesia was achieved, a tourniquet was placed on  the patient's upper thigh. The patient's knee and leg were cleaned and prepped with alcohol and DuraPrep and draped in the usual sterile fashion. A "timeout" was performed as per usual protocol. The lower extremity was exsanguinated using an Esmarch, and the tourniquet was inflated to 300 mmHg. An anterior longitudinal incision was made followed by a standard mid vastus approach. The deep fibers of the medial collateral ligament were elevated in a subperiosteal fashion off of the medial flare of the tibia so as to maintain a continuous soft tissue sleeve. The patella was subluxed laterally and the patellofemoral ligament was incised. Inspection of the knee demonstrated severe degenerative changes with full-thickness loss of articular cartilage. Osteophytes were debrided using a rongeur. Anterior and posterior cruciate ligaments were excised. Two 4.0 mm Schanz pins were inserted in the femur and into the tibia for attachment of the array of trackers used for computer-assisted navigation. Hip center was identified using a circumduction technique. Distal landmarks were mapped using the computer. The distal femur and proximal tibia were mapped using the computer. The distal femoral cutting guide was positioned using computer-assisted navigation so as to achieve a 5 distal valgus cut. The femur was sized and it was felt that a size 6 femoral component was appropriate. A size 6 femoral cutting guide was positioned and the anterior cut was performed and verified using the computer. This was followed by completion of the posterior and chamfer cuts. Femoral cutting guide for the central box was then positioned in the center box cut was performed.  Attention was then directed to the proximal tibia. Medial and lateral menisci were excised. The extramedullary tibial cutting guide was positioned using computer-assisted navigation so as to achieve a 0 varus-valgus alignment and 3 posterior slope. The cut was performed and  verified using the computer. The proximal tibia  was sized and it was felt that a size 8 tibial tray was appropriate. Tibial and femoral trials were inserted followed by insertion of a 5 mm polyethylene insert. This allowed for excellent mediolateral soft tissue balancing both in flexion and in full extension. Finally, the patella was cut and prepared so as to accommodate a 41 mm medialized dome patella. A patella trial was placed and the knee was placed through a range of motion with excellent patellar tracking appreciated. The femoral trial was removed after debridement of posterior osteophytes. The central post-hole for the tibial component was reamed followed by insertion of a keel punch. Tibial trials were then removed. Cut surfaces of bone were irrigated with copious amounts of normal saline with antibiotic solution using pulsatile lavage and then suctioned dry. Polymethylmethacrylate cement was prepared in the usual fashion using a vacuum mixer. Cement was applied to the cut surface of the proximal tibia as well as along the undersurface of a size 8 rotating platform tibial component. Tibial component was positioned and impacted into place. Excess cement was removed using [employer] fast streamer. Cement was then applied to the cut surfaces of the femur as well as along the posterior flanges of the size 6 femoral component. The femoral component was positioned and impacted into place. Excess cement was removed using [employer] fast streamer. A 5 mm polyethylene trial was inserted and the knee was brought into full extension with steady axial compression applied. Finally, cement was applied to the backside of a 41 mm medialized dome patella and the patellar component was positioned and patellar clamp applied. Excess cement was removed using [employer] fast streamer. After adequate curing of the cement, the tourniquet was deflated after a total tourniquet time of 90 minutes. Hemostasis was achieved using electrocautery. The knee was  irrigated with copious amounts of normal saline with antibiotic solution using pulsatile lavage and then suctioned dry. 20 mL of 1.3% Exparel and 60 mL of 0.25% Marcaine in 40 mL of normal saline was injected along the posterior capsule, medial and lateral gutters, and along the arthrotomy site. A 5 mm stabilized rotating platform polyethylene insert was inserted and the knee was placed through a range of motion with excellent mediolateral soft tissue balancing appreciated and excellent patellar tracking noted. 2 medium drains were placed in the wound bed and brought out through separate stab incisions. The medial parapatellar portion of the incision was reapproximated using interrupted sutures of #1 Vicryl. Subcutaneous tissue was approximated in layers using first #0 Vicryl followed #2-0 Vicryl. The skin was approximated with skin staples. A sterile dressing was applied.  The patient tolerated the procedure well and was transported to the recovery room in stable condition.    Darius Grimes., M.D.

## 2017-07-04 NOTE — H&P (Signed)
The patient has been re-examined, and the chart reviewed, and there have been no interval changes to the documented history and physical.    The risks, benefits, and alternatives have been discussed at length. The patient expressed understanding of the risks benefits and agreed with plans for surgical intervention.  James P. Hooten, Jr. M.D.    

## 2017-07-04 NOTE — Discharge Instructions (Signed)
°  Instructions after Total Knee Replacement ° ° Mabry Tift P. Valdez Brannan, Jr., M.D.    ° Dept. of Orthopaedics & Sports Medicine ° [facility] ° [address] ° [place], [place]  [address] ° Phone: [phone]   Fax: [phone] ° °  °DIET: °• Drink plenty of non-alcoholic fluids. °• Resume your normal diet. Include foods high in fiber. ° °ACTIVITY:  °• You may use crutches or a walker with weight-bearing as tolerated, unless instructed otherwise. °• You may be weaned off of the walker or crutches by your Physical Therapist.  °• Do NOT place pillows under the knee. Anything placed under the knee could limit your ability to straighten the knee.   °• Continue doing gentle exercises. Exercising will reduce the pain and swelling, increase motion, and prevent muscle weakness.   °• Please continue to use the TED compression stockings for 6 weeks. You may remove the stockings at night, but should reapply them in the morning. °• Do not drive or operate any equipment until instructed. ° °WOUND CARE:  °• Continue to use the PolarCare or ice packs periodically to reduce pain and swelling. °• You may bathe or shower after the staples are removed at the first office visit following surgery. ° °MEDICATIONS: °• You may resume your regular medications. °• Please take the pain medication as prescribed on the medication. °• Do not take pain medication on an empty stomach. °• You have been given a prescription for a blood thinner (Lovenox or Coumadin). Please take the medication as instructed. (NOTE: After completing a 2 week course of Lovenox, take one Enteric-coated aspirin once a day. This along with elevation will help reduce the possibility of phlebitis in your operated leg.) °• Do not drive or drink alcoholic beverages when taking pain medications. ° °CALL THE OFFICE FOR: °• Temperature above 101 degrees °• Excessive bleeding or drainage on the dressing. °• Excessive swelling, coldness, or paleness of the toes. °• Persistent  nausea and vomiting. ° °FOLLOW-UP:  °• You should have an appointment to return to the office in 10-14 days after surgery. °• Arrangements have been made for continuation of Physical Therapy (either home therapy or outpatient therapy). °  °

## 2017-07-04 NOTE — Anesthesia Post-op Follow-up Note (Signed)
Anesthesia QCDR form completed.        

## 2017-07-04 NOTE — Anesthesia Preprocedure Evaluation (Signed)
Anesthesia Evaluation  Patient identified by MRN, date of birth, ID band Patient awake    Reviewed: Allergy & Precautions, H&P , NPO status , Patient's Chart, lab work & pertinent test results  History of Anesthesia Complications Negative for: history of anesthetic complications  Airway Mallampati: III  TM Distance: <3 FB Neck ROM: limited    Dental  (+) Chipped, Edentulous Upper, Edentulous Lower   Pulmonary neg shortness of breath, former smoker,           Cardiovascular Exercise Tolerance: Good hypertension, (-) angina+ CAD and + CABG  (-) DOE + dysrhythmias Atrial Fibrillation      Neuro/Psych  Neuromuscular disease negative psych ROS   GI/Hepatic Neg liver ROS, GERD  Medicated and Controlled,  Endo/Other  negative endocrine ROS  Renal/GU Renal disease     Musculoskeletal  (+) Arthritis ,   Abdominal   Peds  Hematology negative hematology ROS (+)   Anesthesia Other Findings Past Medical History: No date: Acid reflux No date: Allergic rhinitis No date: Arthritis No date: Coronary artery disease No date: Dysrhythmia No date: Episodic atrial fibrillation (HCC) No date: GERD (gastroesophageal reflux disease) No date: Heart palpitations No date: History of kidney stones No date: Hyperlipidemia No date: Hypertension No date: Kidney stones No date: Neck pain No date: Recurrent nephrolithiasis No date: Skin lesion of back  Past Surgical History: No date: CARPAL TUNNEL RELEASE; Left No date: CORONARY ARTERY BYPASS GRAFT No date: HYDROCELE EXCISION / REPAIR No date: PROSTATE SURGERY No date: THROAT SURGERY No date: URETERAL STENT PLACEMENT     Comment:  at the [place] No date: Vocal cord poylp removal  BMI    Body Mass Index:  36.48 kg/m      Reproductive/Obstetrics negative OB ROS                             Anesthesia Physical Anesthesia Plan  ASA: III  Anesthesia Plan:  Spinal   Post-op Pain Management:    Induction:   PONV Risk Score and Plan: 1 and Propofol infusion and Midazolam  Airway Management Planned: Natural Airway and Nasal Cannula  Additional Equipment:   Intra-op Plan:   Post-operative Plan:   Informed Consent: I have reviewed the patients History and Physical, chart, labs and discussed the procedure including the risks, benefits and alternatives for the proposed anesthesia with the patient or authorized representative who has indicated his/her understanding and acceptance.   Dental Advisory Given  Plan Discussed with: Anesthesiologist, CRNA and Surgeon  Anesthesia Plan Comments: (Patient reports no bleeding problems and no anticoagulant use.  Plan for spinal with backup GA  Patient consented for risks of anesthesia including but not limited to:  - adverse reactions to medications - risk of bleeding, infection, nerve damage and headache - risk of failed spinal - damage to teeth, lips or other oral mucosa - sore throat or hoarseness - Damage to heart, brain, lungs or loss of life  Patient voiced understanding.)        Anesthesia Quick Evaluation

## 2017-07-05 LAB — CBC
HCT: 40.2 % (ref 40.0–52.0)
HEMOGLOBIN: 13.5 g/dL (ref 13.0–18.0)
MCH: 30.3 pg (ref 26.0–34.0)
MCHC: 33.7 g/dL (ref 32.0–36.0)
MCV: 89.9 fL (ref 80.0–100.0)
PLATELETS: 190 10*3/uL (ref 150–440)
RBC: 4.47 MIL/uL (ref 4.40–5.90)
RDW: 13.1 % (ref 11.5–14.5)
WBC: 13.8 10*3/uL — AB (ref 3.8–10.6)

## 2017-07-05 LAB — BASIC METABOLIC PANEL
ANION GAP: 8 (ref 5–15)
BUN: 15 mg/dL (ref 6–20)
CHLORIDE: 103 mmol/L (ref 101–111)
CO2: 26 mmol/L (ref 22–32)
Calcium: 8.4 mg/dL — ABNORMAL LOW (ref 8.9–10.3)
Creatinine, Ser: 0.85 mg/dL (ref 0.61–1.24)
GFR calc Af Amer: >60 mL/min (ref >60)
GLUCOSE: 134 mg/dL — AB (ref 65–99)
POTASSIUM: 3.5 mmol/L (ref 3.5–5.1)
SODIUM: 137 mmol/L (ref 135–145)

## 2017-07-05 MED ORDER — OXYCODONE HCL 5 MG PO TABS: 5.0000 mg | ORAL_TABLET | ORAL | 0 refills | Status: DC | PRN

## 2017-07-05 MED ORDER — ENOXAPARIN SODIUM 40 MG/0.4ML ~~LOC~~ SOLN: 40.0000 mg | SUBCUTANEOUS | Status: DC

## 2017-07-05 NOTE — NC FL2 (Signed)
[place] LEVEL OF CARE SCREENING TOOL     IDENTIFICATION  Patient Name: Darius Grimes Birthdate: [birth year] Sex: male Admission Date (Current Location): 07/04/2017  [place] and [place] Number:  Engineering geologist and Address:  [facility], [address], [place], [place] [address]      Provider Number: [id number]  Attending Physician Name and Address:  Dereck Leep, MD  Relative Name and Phone Number:       Current Level of Care: Hospital Recommended Level of Care: [place] Prior Approval Number:    Date Approved/Denied:   PASRR Number: [id number])  Discharge Plan: SNF    Current Diagnoses: Patient Active Problem List   Diagnosis Date Noted  . S/P total knee arthroplasty 07/04/2017  . Vitamin D deficiency 10/22/2016  . B12 deficiency 10/22/2016  . Wears hearing aid 08/19/2015  . Bronchitis with bronchospasm 07/18/2015  . Allergic rhinitis 03/19/2015  . AF (paroxysmal atrial fibrillation) ([place]) 03/19/2015  . Acid reflux 03/19/2015  . Dyslipidemia 03/19/2015  . Hypertension goal BP (blood pressure) < 140/90 03/19/2015  . Cervical radiculitis 03/19/2015  . Calculus of kidney 03/19/2015  . S/P coronary artery bypass graft x 3 03/06/2015    Orientation RESPIRATION BLADDER Height & Weight     Self, Time, Situation, Place  Normal Continent Weight: 247 lb (112 kg) Height:  5\' 9"  (175.3 cm)  BEHAVIORAL SYMPTOMS/MOOD NEUROLOGICAL BOWEL NUTRITION STATUS      Continent Diet(Diet: Low sodium, Heart healthy )  AMBULATORY STATUS COMMUNICATION OF NEEDS Skin   Extensive Assist Verbally Surgical wounds(Incision: Left Knee )                       Personal Care Assistance Level of Assistance  Bathing, Feeding, Dressing Bathing Assistance: Limited assistance Feeding assistance: Independent Dressing Assistance: Limited assistance     Functional Limitations Info  Sight, Hearing, Speech Sight Info:  Adequate Hearing Info: Impaired Speech Info: Adequate    SPECIAL CARE FACTORS FREQUENCY  PT (By licensed PT), OT (By licensed OT)     PT Frequency: (5) OT Frequency: (5)            Contractures      Additional Factors Info  Code Status, Allergies Code Status Info: (Full Code. ) Allergies Info: (No Known Allergies. )           Current Medications (07/05/2017):  This is the current hospital active medication list Current Facility-Administered Medications  Medication Dose Route Frequency Provider Last Rate Last Dose  . 0.9 %  sodium chloride infusion   Intravenous Continuous Hooten, Laurice Record, MD 100 mL/hr at 07/04/17 1627    . acetaminophen (OFIRMEV) IV 1,000 mg  1,000 mg Intravenous Q6H Hooten, Laurice Record, MD   Stopped at 07/05/17 0227  . acetaminophen (TYLENOL) tablet 650 mg  650 mg Oral Q4H PRN Hooten, Laurice Record, MD       Or  . acetaminophen (TYLENOL) suppository 650 mg  650 mg Rectal Q4H PRN Hooten, Laurice Record, MD      . alum & mag hydroxide-simeth (MAALOX/MYLANTA) 200-200-20 MG/5ML suspension 30 mL  30 mL Oral Q4H PRN Hooten, Laurice Record, MD      . amLODipine (NORVASC) tablet 5 mg  5 mg Oral BID Hooten, Laurice Record, MD   5 mg at 07/05/17 0804  . bisacodyl (DULCOLAX) suppository 10 mg  10 mg Rectal Daily PRN Hooten, Laurice Record, MD      .  celecoxib (CELEBREX) capsule 200 mg  200 mg Oral Q12H Hooten, Laurice Record, MD   200 mg at 07/05/17 0804  . cholecalciferol (VITAMIN D) tablet 5,000 Units  5,000 Units Oral BID Dereck Leep, MD   5,000 Units at 07/05/17 0803  . diphenhydrAMINE (BENADRYL) 12.5 MG/5ML elixir 12.5-25 mg  12.5-25 mg Oral Q4H PRN Hooten, Laurice Record, MD      . enoxaparin (LOVENOX) injection 30 mg  30 mg Subcutaneous Q12H Hooten, Laurice Record, MD   30 mg at 07/05/17 0753  . [START ON 07/07/2017] enoxaparin (LOVENOX) injection 40 mg  40 mg Subcutaneous Q24H Vance Peper R, PA      . ferrous sulfate tablet 325 mg  325 mg Oral BID WC Dereck Leep, MD   325 mg at 07/05/17 0753  . finasteride  (PROSCAR) tablet 5 mg  5 mg Oral Daily Hooten, Laurice Record, MD   5 mg at 07/05/17 0803  . fluticasone (FLONASE) 50 MCG/ACT nasal spray 2 spray  2 spray Each Nare QHS Hooten, Laurice Record, MD      . furosemide (LASIX) tablet 20 mg  20 mg Oral BID Hooten, Laurice Record, MD   20 mg at 07/05/17 0753  . lisinopril (PRINIVIL,ZESTRIL) tablet 40 mg  40 mg Oral Daily Hooten, Laurice Record, MD   40 mg at 07/05/17 0804  . magnesium hydroxide (MILK OF MAGNESIA) suspension 30 mL  30 mL Oral Daily PRN Hooten, Laurice Record, MD      . menthol-cetylpyridinium (CEPACOL) lozenge 3 mg  1 lozenge Oral PRN Hooten, Laurice Record, MD       Or  . phenol (CHLORASEPTIC) mouth spray 1 spray  1 spray Mouth/Throat PRN Hooten, Laurice Record, MD      . metoCLOPramide (REGLAN) tablet 10 mg  10 mg Oral TID AC & HS Hooten, Laurice Record, MD   10 mg at 07/05/17 0753  . metoprolol tartrate (LOPRESSOR) tablet 12.5 mg  12.5 mg Oral BID Dereck Leep, MD   12.5 mg at 07/05/17 0805  . mirabegron ER (MYRBETRIQ) tablet 25 mg  25 mg Oral Daily Dereck Leep, MD   25 mg at 07/05/17 0804  . morphine 2 MG/ML injection 2 mg  2 mg Intravenous Q2H PRN Hooten, Laurice Record, MD      . multivitamin with minerals tablet 1 tablet  1 tablet Oral Daily Hooten, Laurice Record, MD   1 tablet at 07/05/17 0802  . omega-3 acid ethyl esters (LOVAZA) capsule 1 g  1 g Oral Daily Hooten, Laurice Record, MD   1 g at 07/05/17 0803  . ondansetron (ZOFRAN) tablet 4 mg  4 mg Oral Q6H PRN Hooten, Laurice Record, MD       Or  . ondansetron (ZOFRAN) injection 4 mg  4 mg Intravenous Q6H PRN Hooten, Laurice Record, MD      . oxyCODONE (Oxy IR/ROXICODONE) immediate release tablet 10 mg  10 mg Oral Q3H PRN Hooten, Laurice Record, MD      . oxyCODONE (Oxy IR/ROXICODONE) immediate release tablet 5 mg  5 mg Oral Q3H PRN Dereck Leep, MD   5 mg at 07/05/17 0803  . pantoprazole (PROTONIX) EC tablet 40 mg  40 mg Oral BID Dereck Leep, MD   40 mg at 07/05/17 0803  . potassium chloride (KLOR-CON) CR tablet 16 mEq  16 mEq Oral BID Dereck Leep, MD    16 mEq at 07/05/17 0805  . pravastatin (PRAVACHOL) tablet 20 mg  20 mg Oral Daily Hooten, Laurice Record, MD      . senna-docusate (Senokot-S) tablet 1 tablet  1 tablet Oral BID Dereck Leep, MD   1 tablet at 07/05/17 0804  . sodium phosphate (FLEET) 7-19 GM/118ML enema 1 enema  1 enema Rectal Once PRN Hooten, Laurice Record, MD      . tamsulosin (FLOMAX) capsule 0.4 mg  0.4 mg Oral Daily Hooten, Laurice Record, MD   0.4 mg at 07/04/17 1758  . traMADol (ULTRAM) tablet 50-100 mg  50-100 mg Oral Q4H PRN Hooten, Laurice Record, MD      . vitamin B-12 (CYANOCOBALAMIN) tablet 1,000 mcg  1,000 mcg Oral Daily Hooten, Laurice Record, MD   1,000 mcg at 07/05/17 4497     Discharge Medications: Please see discharge summary for a list of discharge medications.  Relevant Imaging Results:  Relevant Lab Results:   Additional Information (SSN: [id number])  Nasif Bos, Veronia Beets, LCSW

## 2017-07-05 NOTE — Care Management Note (Signed)
Case Management Note  Patient Details  Name: Darius Grimes MRN: [id number] Date of Birth: Mar 04, 1946  Subjective/Objective:  POD # 1 s/p L TKA. Met with patient at bedside to discuss discharge planning. He lives at home with his wife who will assist him. He has a walker but isn't sure if it is 2 wheels or 4 wheels. He will let RNCM know. Offered choice of home health agencies. Referral to [facility] for [facility] since [facility] can not accept his insurance. [place]  [phone]. Will call Lovenox prior to discharge for price.                Action/Plan: [facility] for HHPT  Expected Discharge Date:                  Expected Discharge Plan:  [place]  In-House Referral:     Discharge planning Services  CM Consult  Post Acute Care Choice:  Home Health Choice offered to:  Patient  DME Arranged:    DME Agency:     HH Arranged:  PT [place]:  [facility]  Status of Service:  In process, will continue to follow  If discussed at Long Length of Stay Meetings, dates discussed:    Additional Comments:  Jolly Mango, RN 07/05/2017, 11:13 AM

## 2017-07-05 NOTE — Progress Notes (Signed)
Physical Therapy Treatment Patient Details Name: Darius Grimes MRN: [id number] DOB: Oct 10, 1945 Today's Date: 07/05/2017    History of Present Illness Pt is a 71 y.o. male s/p L TKA secondary to degenerative arthrosis 07/04/17.  PMH includes htn, CAD, CABG, a-fib, neck pain, throat surgery, and CTR.    PT Comments    Pt progressing well with functional mobility and reporting minimal L knee pain during session.  Able to ambulate 170 feet with RW CGA; occasional vc's for gait technique required.  Will continue to progress pt with strengthening, L knee ROM, increasing ambulation distance, and  stairs tomorrow morning as appropriate.    Follow Up Recommendations  Home health PT     Equipment Recommendations  Rolling walker with 5" wheels    Recommendations for Other Services       Precautions / Restrictions Precautions Precautions: Knee;Fall Precaution Booklet Issued: Yes (comment) Required Braces or Orthoses: Knee Immobilizer - Left Knee Immobilizer - Right: Discontinue once straight leg raise with < 10 degree lag Restrictions Weight Bearing Restrictions: Yes LLE Weight Bearing: Weight bearing as tolerated    Mobility  Bed Mobility               General bed mobility comments: Deferred, pt up in recliner  Transfers Overall transfer level: Needs assistance Equipment used: Rolling walker (2 wheeled) Transfers: Sit to/from Stand Sit to Stand: Supervision         General transfer comment: steady with transfers; no vc's required  Ambulation/Gait Ambulation/Gait assistance: Min guard Ambulation Distance (Feet): 170 Feet Assistive device: Rolling walker (2 wheeled)   Gait velocity: decreased   General Gait Details: decreased stance time L LE; vc's to increase UE support through RW; vc's for technique/walker use; partial step through pattern   Stairs            Wheelchair Mobility    Modified Rankin (Stroke Patients Only)       Balance Overall  balance assessment: Needs assistance Sitting-balance support: No upper extremity supported;Feet supported Sitting balance-Leahy Scale: Normal Sitting balance - Comments: sitting reaching outside BOS   Standing balance support: No upper extremity supported Standing balance-Leahy Scale: Fair Standing balance comment: static standing no UE support steady (mostly [place] through non-operative LE)                            Cognition Arousal/Alertness: Awake/alert Behavior During Therapy: WFL for tasks assessed/performed Overall Cognitive Status: Within Functional Limits for tasks assessed                                        Exercises Goniometric ROM (taken in AM): L knee extension 5 degrees short of neutral semi-supine in chair; L knee flexion 90 degrees AROM sitting in recliner General Exercises - Lower Extremity Long Arc Quad: AROM;Strengthening;Both;10 reps;Seated Other Exercises Other Exercises: Sitting L knee flexion AROM x3 reps with 15 second holds at end range (sitting in chair).    General Comments General comments (skin integrity, edema, etc.): L LE dressings and hemovac in place.  Nursing cleared pt for participation in physical therapy.  Pt agreeable to PT session.      Pertinent Vitals/Pain Pain Assessment: 0-10 Pain Score: 1  Pain Location: L knee Pain Descriptors / Indicators: Sore Pain Intervention(s): Limited activity within patient's tolerance;Monitored during session;Premedicated before session;Repositioned;Ice applied  Vitals (HR and  O2 on room air) stable and WFL throughout treatment session.    Home Living                      Prior Function            PT Goals (current goals can now be found in the care plan section) Acute Rehab PT Goals Patient Stated Goal: to go home PT Goal Formulation: With patient/family Time For Goal Achievement: 07/18/17 Potential to Achieve Goals: Good Additional Goals Additional Goal  #1: Pt's L knee ROM 0-90 degrees. Progress towards PT goals: Progressing toward goals    Frequency    BID      PT Plan Current plan remains appropriate    Co-evaluation              AM-PAC PT "6 Clicks" Daily Activity  Outcome Measure  Difficulty turning over in bed (including adjusting bedclothes, sheets and blankets)?: A Little Difficulty moving from lying on back to sitting on the side of the bed? : A Little Difficulty sitting down on and standing up from a chair with arms (e.g., wheelchair, bedside commode, etc,.)?: A Little Help needed moving to and from a bed to chair (including a wheelchair)?: A Little Help needed walking in hospital room?: A Little Help needed climbing 3-5 steps with a railing? : A Little 6 Click Score: 18    End of Session Equipment Utilized During Treatment: Gait belt Activity Tolerance: Patient tolerated treatment well Patient left: in chair;with call bell/phone within reach;with chair alarm set;with nursing/sitter in room;with family/visitor present;with SCD's reapplied(B heels elevated via towel rolls; polar care in place and activated) Nurse Communication: Mobility status;Precautions;Weight bearing status PT Visit Diagnosis: Other abnormalities of gait and mobility (R26.89);Muscle weakness (generalized) (M62.81);Pain Pain - Right/Left: Left Pain - part of body: Knee     Time: 4332-9518 PT Time Calculation (min) (ACUTE ONLY): 23 min  Charges:  $Gait Training: 8-22 mins $Therapeutic Exercise: 8-22 mins                    G CodesLeitha Bleak, PT 07/05/17, 2:27 PM [phone]

## 2017-07-05 NOTE — Progress Notes (Signed)
Pt is complaining that no one has checked on him often enough. RN explains that he has been checked on hourly and pt says that this is not true. Bedside rounding was performed at 1915 with RN going off. RN was also in room for med pass at 6754,4920, and 2203.  Pt complains that RN does not smile enough and has givin him an attitude. RN explains that she does not mean to give that impression. In no acute physical distress at this time. Post op pain is mild.

## 2017-07-05 NOTE — Discharge Summary (Signed)
Physician Discharge Summary  Patient ID: Darius Grimes MRN: [id number] DOB/AGE: 70-Dec-1947 70 y.o.  Admit date: 07/04/2017 Discharge date: 07/06/2017  Admission Diagnoses:  Primary Osteoarthritis of Left Knee   Discharge Diagnoses: Patient Active Problem List   Diagnosis Date Noted  . S/P total knee arthroplasty 07/04/2017  . Vitamin D deficiency 10/22/2016  . B12 deficiency 10/22/2016  . Wears hearing aid 08/19/2015  . Bronchitis with bronchospasm 07/18/2015  . Allergic rhinitis 03/19/2015  . AF (paroxysmal atrial fibrillation) ([place]) 03/19/2015  . Acid reflux 03/19/2015  . Dyslipidemia 03/19/2015  . Hypertension goal BP (blood pressure) < 140/90 03/19/2015  . Cervical radiculitis 03/19/2015  . Calculus of kidney 03/19/2015  . S/P coronary artery bypass graft x 3 03/06/2015    Past Medical History:  Diagnosis Date  . Acid reflux   . Allergic rhinitis   . Arthritis   . Coronary artery disease   . Dysrhythmia   . Episodic atrial fibrillation ([place])   . GERD (gastroesophageal reflux disease)   . Heart palpitations   . History of kidney stones   . Hyperlipidemia   . Hypertension   . Kidney stones   . Neck pain   . Recurrent nephrolithiasis   . Skin lesion of back      Transfusion: No transfusions during this admission   Consultants (if any):   Discharged Condition: Improved  Hospital Course: Darius Grimes is an 71 y.o. male who was admitted 07/04/2017 with a diagnosis of degenerative arthrosis left knee and went to the operating room on 07/04/2017 and underwent the above named procedures.    Surgeries:Procedure(s): COMPUTER ASSISTED TOTAL KNEE ARTHROPLASTY on 07/04/2017  PRE-OPERATIVE DIAGNOSIS: Degenerative arthrosis of the left knee, primary  POST-OPERATIVE DIAGNOSIS:  Same  PROCEDURE:  Left total knee arthroplasty using computer-assisted navigation  SURGEON:  Marciano Sequin. M.D.  ASSISTANT:  Vance Peper, PA (present and scrubbed throughout the  case, critical for assistance with exposure, retraction, instrumentation, and closure)  ANESTHESIA: spinal  ESTIMATED BLOOD LOSS: 50 mL  FLUIDS REPLACED: 1200 mL of crystalloid  TOURNIQUET TIME: 90 minutes  DRAINS: 2 medium Hemovac drains  SOFT TISSUE RELEASES: Anterior cruciate ligament, posterior cruciate ligament, deep medial collateral ligament, patellofemoral ligament  IMPLANTS UTILIZED: DePuy Attune size 6 posterior stabilized femoral component (cemented), size 8 rotating platform tibial component (cemented), 41 mm medialized dome patella (cemented), and a 5 mm stabilized rotating platform polyethylene insert.  INDICATIONS FOR SURGERY: Darius Grimes is a 71 y.o. year old male with a long history of progressive knee pain. X-rays demonstrated severe degenerative changes in tricompartmental fashion. The patient had not seen any significant improvement despite conservative nonsurgical intervention. After discussion of the risks and benefits of surgical intervention, the patient expressed understanding of the risks benefits and agree with plans for total knee arthroplasty.   The risks, benefits, and alternatives were discussed at length including but not limited to the risks of infection, bleeding, nerve injury, stiffness, blood clots, the need for revision surgery, cardiopulmonary complications, among others, and they were willing to proceed.    Patient tolerated the surgery well. No complications .Patient was taken to PACU where she was stabilized and then transferred to the orthopedic floor.  Patient started on Lovenox 30 mg q 12 hrs. Foot pumps applied bilaterally at 80 mm hgb. Heels elevated off bed with rolled towels. No evidence of DVT. Calves non tender. Negative [place]. Physical therapy started on day #1 for gait training and transfer with OT starting on  day #1 for ADL and assisted devices. Patient has done well with therapy. Ambulated greater than 200 feet upon being  discharged. Able to ascend and descend 4 steps safely and independently  Patient's IV And Foley were discontinued on day #1 with Hemovac being discontinued on day #2. Dressing was changed on day 2 prior to patient being discharged   He was given perioperative antibiotics:  Anti-infectives (From admission, onward)   Start     Dose/Rate Route Frequency Ordered Stop   07/04/17 1400  ceFAZolin (ANCEF) IVPB 2g/100 mL premix  Status:  Discontinued     2 g 200 mL/hr over 30 Minutes Intravenous Every 6 hours 07/04/17 1249 07/04/17 1301   07/04/17 1400  ceFAZolin (ANCEF) 2 g in dextrose 5 % 100 mL IVPB     2 g 240 mL/hr over 30 Minutes Intravenous Every 6 hours 07/04/17 1301 07/05/17 1359   07/04/17 0600  ceFAZolin (ANCEF) IVPB 2g/100 mL premix     2 g 200 mL/hr over 30 Minutes Intravenous On call to O.R. 07/03/17 2216 07/04/17 0757   07/04/17 0551  ceFAZolin (ANCEF) 2-4 GM/100ML-% IVPB    Comments:  Larita Fife   : cabinet override      07/04/17 0551 07/04/17 0749    .  He was fitted with AV 1 compression foot pump devices, instructed on heel pumps, early ambulation, and fitted with TED stockings bilaterally for DVT prophylaxis.  He benefited maximally from the hospital stay and there were no complications.    Recent vital signs:  Vitals:   07/04/17 2336 07/05/17 0355  BP: (!) 163/73 (!) 153/87  Pulse: 84 (!) 59  Resp: 18 18  Temp: 97.6 F (36.4 C) 97.8 F (36.6 C)  SpO2: 94% 97%    Recent laboratory studies:  Lab Results  Component Value Date   HGB 13.5 07/05/2017   HGB 15.5 06/22/2017   HGB 15.7 10/22/2016   Lab Results  Component Value Date   WBC 13.8 (H) 07/05/2017   PLT 190 07/05/2017   Lab Results  Component Value Date   INR 1.01 06/22/2017   Lab Results  Component Value Date   NA 137 07/05/2017   K 3.5 07/05/2017   CL 103 07/05/2017   CO2 26 07/05/2017   BUN 15 07/05/2017   CREATININE 0.85 07/05/2017   GLUCOSE 134 (H) 07/05/2017    Discharge  Medications:   Allergies as of 07/06/2017   No Known Allergies     Medication List    TAKE these medications   amLODipine 5 MG tablet Commonly known as:  NORVASC Take 5 mg by mouth 2 (two) times daily.   aspirin 81 MG tablet Take 81 mg by mouth daily.   cholecalciferol 1000 units tablet Commonly known as:  VITAMIN D Take 5,000 Units by mouth 2 (two) times daily.   docusate sodium 50 MG capsule Commonly known as:  COLACE Take 50 mg by mouth 2 (two) times daily.   enoxaparin 40 MG/0.4ML injection Commonly known as:  LOVENOX Inject 0.4 mLs (40 mg total) daily into the skin. Start taking on:  07/07/2017   finasteride 5 MG tablet Commonly known as:  PROSCAR Take 5 mg by mouth daily.   Fish Oil 1000 MG Caps Take 1 capsule by mouth daily.   fluticasone 50 MCG/ACT nasal spray Commonly known as:  FLONASE Place 2 sprays into both nostrils at bedtime.   furosemide 20 MG tablet Commonly known as:  LASIX Take 20 mg by mouth  2 (two) times daily.   lisinopril 40 MG tablet Commonly known as:  PRINIVIL,ZESTRIL Take 40 mg by mouth daily.   metoprolol tartrate 12.5 mg Tabs tablet Commonly known as:  LOPRESSOR Take 12.5 mg by mouth 2 (two) times daily.   mirabegron ER 25 MG Tb24 tablet Commonly known as:  MYRBETRIQ Take 25 mg by mouth daily.   multivitamin with minerals Tabs tablet Take 1 tablet by mouth daily.   oxyCODONE 5 MG immediate release tablet Commonly known as:  Oxy IR/ROXICODONE Take 1 tablet (5 mg total) every 3 (three) hours as needed by mouth for moderate pain ((score 4 to 6)).   potassium chloride 8 MEQ tablet Commonly known as:  KLOR-CON Take 16 mEq by mouth 2 (two) times daily.   pravastatin 40 MG tablet Commonly known as:  PRAVACHOL Take 20 mg by mouth daily. Takes irregular   ranitidine 150 MG tablet Commonly known as:  ZANTAC Take 150 mg by mouth 2 (two) times daily.   tamsulosin 0.4 MG Caps capsule Commonly known as:  FLOMAX Take 0.4 mg by  mouth daily.   traMADol 50 MG tablet Commonly known as:  ULTRAM Take 50-100 mg by mouth every 6 (six) hours as needed for moderate pain or severe pain.   Turmeric 500 MG Caps Take 1 capsule by mouth once.   vitamin B-12 1000 MCG tablet Commonly known as:  CYANOCOBALAMIN Take 1,000 mcg by mouth daily.            Durable Medical Equipment  (From admission, onward)        Start     Ordered   07/04/17 1250  DME Walker rolling  Once    Question:  Patient needs a walker to treat with the following condition  Answer:  Total knee replacement status   07/04/17 1249   07/04/17 1250  DME Bedside commode  Once    Question:  Patient needs a bedside commode to treat with the following condition  Answer:  Total knee replacement status   07/04/17 1249      Diagnostic Studies: Dg Knee Left Port  Result Date: 07/04/2017 CLINICAL DATA:  Status post total knee replacement EXAM: PORTABLE LEFT KNEE - 1-2 VIEW COMPARISON:  None. FINDINGS: Frontal and lateral views were obtained. The patient is status post total knee replacement with prosthetic femoral and tibial components well-seated. No acute fracture or dislocation. No erosive change. Fluid and air are noted in the joint as well as surgical drains, expected postoperative findings. Previous screw tracts are noted in the distal femur with alignment anatomic. IMPRESSION: Total knee replacement with prosthetic components well-seated. No acute fracture or dislocation. Electronically Signed   By: Lowella Grip III M.D.   On: 07/04/2017 11:39    Disposition: 01-Home or Self Care  Discharge Instructions    Diet - low sodium heart healthy   Complete by:  As directed    Increase activity slowly   Complete by:  As directed       Follow-up Information    Watt Climes, PA On 07/19/2017.   Specialty:  Physician Assistant Why:  1:15pm Contact information: [place]  [id number] [phone]        Dereck Leep, MD On 08/16/2017.   Specialty:  Orthopedic Surgery Why:  at 2:15pm Contact information: [place] [address] [phone]            Signed: Watt Climes.  07/05/2017, 7:33 AM

## 2017-07-05 NOTE — Progress Notes (Signed)
Clinical Social Worker (CSW) received SNF consult. PT is recommending home health. RN case manager aware of above. Please reconsult if future social work needs arise. CSW signing off.   Rondell Frick, LCSW [phone] 

## 2017-07-05 NOTE — Progress Notes (Signed)
Physical Therapy Treatment Patient Details Name: Darius Grimes MRN: [id number] DOB: [birth year] Today's Date: 07/05/2017    History of Present Illness Pt is a 71 y.o. male s/p L TKA secondary to degenerative arthrosis 07/04/17.  PMH includes htn, CAD, CABG, a-fib, neck pain, throat surgery, and CTR.    PT Comments    Pt reporting 1-2/10 L knee pain at rest (beginning and end of session) and up to 4/10 pain with activities during session.  Currently pt SBA with transfers and CGA ambulating 90 feet with RW with vc's for gait technique and walker use occasionally.  Pt very motivated to participate in PT but requires education on pacing and not over-doing it with activity.  Will continue to progress pt with strengthening, L knee ROM, and progressive functional mobility per pt tolerance.   Follow Up Recommendations  Home health PT     Equipment Recommendations  Rolling walker with 5" wheels    Recommendations for Other Services       Precautions / Restrictions Precautions Precautions: Knee;Fall Precaution Booklet Issued: Yes (comment) Required Braces or Orthoses: Knee Immobilizer - Left Knee Immobilizer - Right: Discontinue once straight leg raise with < 10 degree lag Restrictions Weight Bearing Restrictions: Yes LLE Weight Bearing: Weight bearing as tolerated    Mobility  Bed Mobility               General bed mobility comments: Deferred, pt up in recliner  Transfers Overall transfer level: Needs assistance Equipment used: Rolling walker (2 wheeled) Transfers: Sit to/from Stand Sit to Stand: Supervision         General transfer comment: steady with transfers; no vc's required  Ambulation/Gait Ambulation/Gait assistance: Min guard Ambulation Distance (Feet): 90 Feet Assistive device: Rolling walker (2 wheeled)   Gait velocity: decreased   General Gait Details: decreased stance time L LE; vc's to increase UE support through RW; vc's for technique/walker use;  step to pattern   Stairs            Wheelchair Mobility    Modified Rankin (Stroke Patients Only)       Balance Overall balance assessment: Needs assistance Sitting-balance support: No upper extremity supported;Feet supported Sitting balance-Leahy Scale: Normal Sitting balance - Comments: sitting reaching outside BOS   Standing balance support: No upper extremity supported Standing balance-Leahy Scale: Fair Standing balance comment: static standing no UE support steady (mostly [place] through non-operative LE)                            Cognition Arousal/Alertness: Awake/alert Behavior During Therapy: WFL for tasks assessed/performed Overall Cognitive Status: Within Functional Limits for tasks assessed                                        Exercises Total Joint Exercises Ankle Circles/Pumps: AROM;Strengthening;Both;10 reps;Supine Quad Sets: AROM;Strengthening;Both;10 reps;Supine Short Arc Quad: AROM;Strengthening;Left;10 reps;Supine Heel Slides: AAROM;Strengthening;Left;10 reps;Supine Hip ABduction/ADduction: AAROM;Strengthening;Left;10 reps;Supine Straight Leg Raises: AROM;Strengthening;Left;10 reps;Supine Goniometric ROM: L knee extension 5 degrees short of neutral semi-supine in chair; L knee flexion 90 degrees AROM sitting in recliner    General Comments General comments (skin integrity, edema, etc.): L LE dressings and hemovac in place.  Pt agreeable to PT session.  Pt's wife present during session.      Pertinent Vitals/Pain Pain Assessment: 0-10 Pain Score: 2  Pain Descriptors / Indicators:  Sore Pain Intervention(s): Limited activity within patient's tolerance;Monitored during session;Premedicated before session;Repositioned;Ice applied    Home Living Family/patient expects to be discharged to:: Private residence Living Arrangements: Spouse/significant other Available Help at Discharge: Family(spouse is a CNA) Type of Home:  House Home Access: Stairs to enter   Tome: One level Home Equipment: Grab bars - tub/shower;Walker - 2 wheels;Cane - single point;Adaptive equipment      Prior Function Level of Independence: Independent      Comments: Pt denies any falls in past 6 months. Reports taking sponge baths out of the sink his whole life so doesn't use the shower. Pt very independent.   PT Goals (current goals can now be found in the care plan section) Acute Rehab PT Goals Patient Stated Goal: to go home PT Goal Formulation: With patient/family Time For Goal Achievement: 07/18/17 Potential to Achieve Goals: Good Additional Goals Additional Goal #1: Pt's L knee ROM 0-90 degrees. Progress towards PT goals: Progressing toward goals    Frequency    BID      PT Plan Current plan remains appropriate    Co-evaluation              AM-PAC PT "6 Clicks" Daily Activity  Outcome Measure  Difficulty turning over in bed (including adjusting bedclothes, sheets and blankets)?: A Little Difficulty moving from lying on back to sitting on the side of the bed? : A Little Difficulty sitting down on and standing up from a chair with arms (e.g., wheelchair, bedside commode, etc,.)?: A Little Help needed moving to and from a bed to chair (including a wheelchair)?: A Little Help needed walking in hospital room?: A Little Help needed climbing 3-5 steps with a railing? : A Little 6 Click Score: 18    End of Session Equipment Utilized During Treatment: Gait belt Activity Tolerance: Patient tolerated treatment well Patient left: in chair;with call bell/phone within reach;with chair alarm set;with nursing/sitter in room;with family/visitor present;with SCD's reapplied(B heels elevated via towel rolls; polar care in place and activated) Nurse Communication: Mobility status;Precautions;Weight bearing status;Other (comment)(Pt's pain status) PT Visit Diagnosis: Other abnormalities of gait and mobility  (R26.89);Muscle weakness (generalized) (M62.81);Pain Pain - Right/Left: Left Pain - part of body: Knee     Time: 1610-9604 PT Time Calculation (min) (ACUTE ONLY): 42 min  Charges:  $Gait Training: 8-22 mins $Therapeutic Exercise: 8-22 mins $Therapeutic Activity: 8-22 mins                    G CodesLeitha Bleak, PT 07/05/17, 10:42 AM [phone]

## 2017-07-05 NOTE — Evaluation (Signed)
Occupational Therapy Evaluation Patient Details Name: Darius Grimes MRN: [id number] DOB: 07-04-1946 Today's Date: 07/05/2017    History of Present Illness Pt is a 71 y.o. male s/p L TKA secondary to degenerative arthrosis 07/04/17.  PMH includes htn, CAD, CABG, a-fib, neck pain, throat surgery, and CTR.   Clinical Impression   Pt is a pleasant 71 year old male s/p L TKR.  Pt was independent in all ADLs prior to surgery and is eager to return to PLOF.  Pt currently requires minimal supervision assist and encouragement to not over exert himself during ADL tasks, but no physical assist required for LB dressing from seated position. No pain noted. Spouse present for half of session. Pt/spouse educated in home/routines modifications, AE/DME, compression stocking mgt, and polar care mgt. Pt/spouse verbalize understanding and verbalize no additional OT needs. Pt to go home tomorrow per pt. All goals met for OT and no follow up recommended.    Follow Up Recommendations  No OT follow up    Equipment Recommendations  None recommended by OT    Recommendations for Other Services       Precautions / Restrictions Precautions Precautions: Knee;Fall Required Braces or Orthoses: Knee Immobilizer - Left Knee Immobilizer - Right: Discontinue once straight leg raise with < 10 degree lag Restrictions Weight Bearing Restrictions: Yes LLE Weight Bearing: Weight bearing as tolerated      Mobility Bed Mobility               General bed mobility comments: deferred, pt up in recliner  Transfers Overall transfer level: Needs assistance Equipment used: Rolling walker (2 wheeled) Transfers: Sit to/from Stand Sit to Stand: Min guard              Balance Overall balance assessment: Needs assistance Sitting-balance support: No upper extremity supported;Feet supported Sitting balance-Leahy Scale: Good     Standing balance support: Bilateral upper extremity supported Standing balance-Leahy  Scale: Fair                             ADL either performed or assessed with clinical judgement   ADL Overall ADL's : Modified independent                                       General ADL Comments: Pt generally modified independent with ADL tasks. Very independent, and does not want assist from others. Able to don underwear himself this morning and demonstrates ability to don/doff socks without AE. Pt declines additional training/education in adaptive strategies for ADL. Pt declines need for [facility] after education into benefits.     Vision Baseline Vision/History: Wears glasses Wears Glasses: Distance only Patient Visual Report: No change from baseline Vision Assessment?: No apparent visual deficits     Perception     Praxis      Pertinent Vitals/Pain Pain Assessment: No/denies pain     Hand Dominance     Extremity/Trunk Assessment Upper Extremity Assessment Upper Extremity Assessment: Overall WFL for tasks assessed   Lower Extremity Assessment Lower Extremity Assessment: Defer to PT evaluation;Overall WFL for tasks assessed(expected post-op deficits in strength/ROM in LLE)   Cervical / Trunk Assessment Cervical / Trunk Assessment: Normal   Communication Communication Communication: HOH   Cognition Arousal/Alertness: Awake/alert Behavior During Therapy: WFL for tasks assessed/performed Overall Cognitive Status: Within Functional Limits for tasks assessed  General Comments       Exercises Other Exercises Other Exercises: Pt/spouse educated in compression stocking mgt and polar care mgt. Both verbalized understanding. Verbalize no additional needs.   Shoulder Instructions      Home Living Family/patient expects to be discharged to:: Private residence Living Arrangements: Spouse/significant other Available Help at Discharge: Family(spouse is a CNA) Type of Home: House Home Access:  Stairs to enter [employer] of Steps: 2 steps with B pillars to hold onto   Home Layout: One level     Bathroom Shower/Tub: Tub/shower unit(doesn't use)   Bathroom Toilet: Standard     Home Equipment: Grab bars - tub/shower;Walker - 2 wheels;Cane - single point;Adaptive equipment Adaptive Equipment: Reacher        Prior Functioning/Environment Level of Independence: Independent        Comments: Pt denies any falls in past 6 months. Reports taking sponge baths out of the sink his whole life so doesn't use the shower. Pt very independent.        OT Problem List: Impaired balance (sitting and/or standing)      OT Treatment/Interventions:      OT Goals(Current goals can be found in the care plan section) Acute Rehab OT Goals Patient Stated Goal: to go home OT Goal Formulation: All assessment and education complete, DC therapy  OT Frequency:     Barriers to D/C:            Co-evaluation              AM-PAC PT "6 Clicks" Daily Activity     Outcome Measure Help from another person eating meals?: None Help from another person taking care of personal grooming?: None Help from another person toileting, which includes using toliet, bedpan, or urinal?: A Little Help from another person bathing (including washing, rinsing, drying)?: A Little Help from another person to put on and taking off regular upper body clothing?: None Help from another person to put on and taking off regular lower body clothing?: A Little 6 Click Score: 21   End of Session    Activity Tolerance: Patient tolerated treatment well Patient left: in chair;with call bell/phone within reach;with chair alarm set;with family/visitor present;with SCD's reapplied;Other (comment)(polar care in place)  OT Visit Diagnosis: Other abnormalities of gait and mobility (R26.89)                Time: 6861-6837 OT Time Calculation (min): 23 min Charges:  OT General Charges $OT Visit: 1 Visit OT  Evaluation $OT Eval Low Complexity: 1 Low OT Treatments $Self Care/Home Management : 8-22 mins G-Codes: OT G-codes **NOT FOR INPATIENT CLASS** Functional Assessment Tool Used: Clinical judgement;AM-PAC 6 Clicks Daily Activity Functional Limitation: Self care Self Care Current Status (G9021): At least 1 percent but less than 20 percent impaired, limited or restricted Self Care Goal Status (J1552): At least 1 percent but less than 20 percent impaired, limited or restricted Self Care Discharge Status [phone]): At least 1 percent but less than 20 percent impaired, limited or restricted   Jeni Salles, MPH, MS, OTR/L ascom [phone] 07/05/17, 9:37 AM

## 2017-07-05 NOTE — Progress Notes (Signed)
   Subjective: 1 Day Post-Op Procedure(s) (LRB): COMPUTER ASSISTED TOTAL KNEE ARTHROPLASTY (Left) Patient reports pain as 3 on 0-10 scale.   Patient is well, and has had no acute complaints or problems Did well with therapy yesterday. Walked 3 feet. His up in chair already today. Plan is to go Home after hospital stay. no nausea and no vomiting Patient denies any chest pains or shortness of breath. Objective: Vital signs in last 24 hours: Temp:  [97.6 F (36.4 C)-99 F (37.2 C)] 97.8 F (36.6 C) (11/06 0355) Pulse Rate:  [59-86] 59 (11/06 0355) Resp:  [16-25] 18 (11/06 0355) BP: (113-163)/(66-95) 153/87 (11/06 0355) SpO2:  [93 %-97 %] 97 % (11/06 0355) Heels are non tender and elevated off the bed using rolled towels along with bone foam under operative leg  Intake/Output from previous day: 11/05 0701 - 11/06 0700 In: 1245 [P.O.:1740; I.V.:2555; IV Piggyback:600] Out: 2591 [Urine:2230; Drains:310; Stool:1; Blood:50] Intake/Output this shift: No intake/output data recorded.  Recent Labs    07/05/17 0612  HGB 13.5   Recent Labs    07/05/17 0612  WBC 13.8*  RBC 4.47  HCT 40.2  PLT 190   Recent Labs    07/05/17 0612  NA 137  K 3.5  CL 103  CO2 26  BUN 15  CREATININE 0.85  GLUCOSE 134*  CALCIUM 8.4*   No results for input(s): LABPT, INR in the last 72 hours.  EXAM General - Patient is Alert, Appropriate and Oriented Extremity - Neurologically intact Neurovascular intact Sensation intact distally Intact pulses distally Dorsiflexion/Plantar flexion intact Compartment soft Dressing - dressing C/D/I Motor Function - intact, moving foot and toes well on exam.    Past Medical History:  Diagnosis Date  . Acid reflux   . Allergic rhinitis   . Arthritis   . Coronary artery disease   . Dysrhythmia   . Episodic atrial fibrillation ([place])   . GERD (gastroesophageal reflux disease)   . Heart palpitations   . History of kidney stones   . Hyperlipidemia   .  Hypertension   . Kidney stones   . Neck pain   . Recurrent nephrolithiasis   . Skin lesion of back     Assessment/Plan: 1 Day Post-Op Procedure(s) (LRB): COMPUTER ASSISTED TOTAL KNEE ARTHROPLASTY (Left) Active Problems:   S/P total knee arthroplasty  Estimated body mass index is 36.48 kg/m as calculated from the following:   Height as of this encounter: 5\' 9"  (1.753 m).   Weight as of this encounter: 112 kg (247 lb). Advance diet Up with therapy D/C IV fluids Plan for discharge tomorrow Discharge home with home health  Labs: Are not back as of this dictation DVT Prophylaxis - Lovenox, Foot Pumps and TED hose Weight-Bearing as tolerated to left leg D/C O2 and Pulse OX and try on Room Air Begin working on bowel movement Labs tomorrow morning  Emmily Pellegrin R. Pettis [place] 07/05/2017, 7:25 AM

## 2017-07-05 NOTE — Anesthesia Postprocedure Evaluation (Signed)
Anesthesia Post Note  Patient: Darius Grimes  Procedure(s) Performed: COMPUTER ASSISTED TOTAL KNEE ARTHROPLASTY (Left Knee)  Patient location during evaluation: Nursing Unit Anesthesia Type: Spinal Level of consciousness: awake, awake and alert and oriented Pain management: pain level controlled Vital Signs Assessment: post-procedure vital signs reviewed and stable Respiratory status: spontaneous breathing Cardiovascular status: blood pressure returned to baseline Postop Assessment: no headache, no backache, no apparent nausea or vomiting and adequate PO intake Anesthetic complications: no     Last Vitals:  Vitals:   07/04/17 2336 07/05/17 0355  BP: (!) 163/73 (!) 153/87  Pulse: 84 (!) 59  Resp: 18 18  Temp: 36.4 C 36.6 C  SpO2: 94% 97%    Last Pain:  Vitals:   07/05/17 0453  TempSrc:   PainSc: 3                  Darius Grimes

## 2017-07-06 LAB — BASIC METABOLIC PANEL
ANION GAP: 7 (ref 5–15)
BUN: 18 mg/dL (ref 6–20)
CHLORIDE: 105 mmol/L (ref 101–111)
CO2: 27 mmol/L (ref 22–32)
Calcium: 8.3 mg/dL — ABNORMAL LOW (ref 8.9–10.3)
Creatinine, Ser: 0.82 mg/dL (ref 0.61–1.24)
Glucose, Bld: 108 mg/dL — ABNORMAL HIGH (ref 65–99)
POTASSIUM: 3.7 mmol/L (ref 3.5–5.1)
SODIUM: 139 mmol/L (ref 135–145)

## 2017-07-06 LAB — CBC
HCT: 35.8 % — ABNORMAL LOW (ref 40.0–52.0)
HEMOGLOBIN: 12.3 g/dL — AB (ref 13.0–18.0)
MCH: 31.4 pg (ref 26.0–34.0)
MCHC: 34.3 g/dL (ref 32.0–36.0)
MCV: 91.6 fL (ref 80.0–100.0)
PLATELETS: 169 10*3/uL (ref 150–440)
RBC: 3.91 MIL/uL — AB (ref 4.40–5.90)
RDW: 13.4 % (ref 11.5–14.5)
WBC: 9.6 10*3/uL (ref 3.8–10.6)

## 2017-07-06 MED ORDER — ENOXAPARIN SODIUM 40 MG/0.4ML ~~LOC~~ SOLN: 40.0000 mg | SUBCUTANEOUS | 0 refills | Status: DC

## 2017-07-06 NOTE — Care Management Note (Addendum)
Case Management Note  Patient Details  Name: Darius Grimes MRN: [id number] Date of Birth: [birth year]  Subjective/Objective:  Discharging today                  Action/Plan: Advanced notified of discharge. Called Lovenox 40 mg # 14, no refills per discharge summary. No DME needed.   Cost of Lovenox is $ 85.19. Patient updated.   Expected Discharge Date:  07/06/17               Expected Discharge Plan:  [place]  In-House Referral:     Discharge planning Services  CM Consult  Post Acute Care Choice:  Home Health Choice offered to:  Patient  DME Arranged:    DME Agency:     HH Arranged:  PT [place]:  [place]  Status of Service:  Completed, signed off  If discussed at [place] of Stay Meetings, dates discussed:    Additional Comments:  Jolly Mango, RN 07/06/2017, 8:28 AM

## 2017-07-06 NOTE — Progress Notes (Signed)
   Subjective: 2 Days Post-Op Procedure(s) (LRB): COMPUTER ASSISTED TOTAL KNEE ARTHROPLASTY (Left) Patient reports pain as mild.   Patient is well, and has had no acute complaints or problems Patient did extremely well with physical therapy yesterday. Plan is to go Home after hospital stay. no nausea and no vomiting Patient denies any chest pains or shortness of breath. Objective: Vital signs in last 24 hours: Temp:  [97.8 F (36.6 C)-98.1 F (36.7 C)] 97.8 F (36.6 C) (11/06 1956) Pulse Rate:  [60-72] 70 (11/06 1956) Resp:  [17-20] 20 (11/06 1956) BP: (129-156)/(61-76) 129/65 (11/06 1956) SpO2:  [93 %-98 %] 96 % (11/06 1956) well approximated incision Heels are non tender and elevated off the bed using rolled towels Intake/Output from previous day: 11/06 0701 - 11/07 0700 In: 920 [P.O.:720; IV Piggyback:200] Out: 170 [Drains:170] Intake/Output this shift: No intake/output data recorded.  Recent Labs    07/05/17 0612 07/06/17 0428  HGB 13.5 12.3*   Recent Labs    07/05/17 0612 07/06/17 0428  WBC 13.8* 9.6  RBC 4.47 3.91*  HCT 40.2 35.8*  PLT 190 169   Recent Labs    07/05/17 0612 07/06/17 0428  NA 137 139  K 3.5 3.7  CL 103 105  CO2 26 27  BUN 15 18  CREATININE 0.85 0.82  GLUCOSE 134* 108*  CALCIUM 8.4* 8.3*   No results for input(s): LABPT, INR in the last 72 hours.  EXAM General - Patient is Alert, Appropriate and Oriented Extremity - Neurologically intact Neurovascular intact Sensation intact distally Intact pulses distally Dorsiflexion/Plantar flexion intact No cellulitis present Compartment soft Dressing - scant drainage Motor Function - intact, moving foot and toes well on exam.    Past Medical History:  Diagnosis Date  . Acid reflux   . Allergic rhinitis   . Arthritis   . Coronary artery disease   . Dysrhythmia   . Episodic atrial fibrillation ([place])   . GERD (gastroesophageal reflux disease)   . Heart palpitations   . History of  kidney stones   . Hyperlipidemia   . Hypertension   . Kidney stones   . Neck pain   . Recurrent nephrolithiasis   . Skin lesion of back     Assessment/Plan: 2 Days Post-Op Procedure(s) (LRB): COMPUTER ASSISTED TOTAL KNEE ARTHROPLASTY (Left) Active Problems:   S/P total knee arthroplasty  Estimated body mass index is 36.48 kg/m as calculated from the following:   Height as of this encounter: 5\' 9"  (1.753 m).   Weight as of this encounter: 112 kg (247 lb). Up with therapy Discharge home with home health  Labs: Were reviewed and acceptable DVT Prophylaxis - Lovenox, Foot Pumps and TED hose Weight-Bearing as tolerated to left leg Please change dressing prior to patient being discharged and give the patient 2 extra honeycomb dressings to take home. Please wash operative leg and apply TED stockings to both legs prior to being discharged. Hemovac was discontinued on today's visit.  Jillyn Ledger. [place] 07/06/2017, 7:02 AM

## 2017-07-06 NOTE — Progress Notes (Signed)
Pt ready for d/c home today per MD. Pt met PT goals today, has needed equipment. PIV removed. Dressing changed per order prior to d/c and extra dressings given to pt. Discharge instructions and prescriptions reviewed with pt and wife, all questions answered. Pt assisted to car via NT.   [place], Jerry Caras

## 2017-07-06 NOTE — Progress Notes (Signed)
Physical Therapy Treatment Patient Details Name: Darius Grimes MRN: [id number] DOB: 11-Sep-1945 Today's Date: 07/06/2017    History of Present Illness Pt is a 71 y.o. male s/p L TKA secondary to degenerative arthrosis 07/04/17.  PMH includes htn, CAD, CABG, a-fib, neck pain, throat surgery, and CTR.    PT Comments    Pt able to progress to ambulating around nursing loop using RW and also ascending/descending stairs safely.  Pt reporting 2/10 L knee pain at rest but 8/10 with transfers and 7/10 with ambulation (pain decreased to 2/10 end of session resting in chair).  Pt reporting feeling like his knee was "sore" d/t swelling and didn't sleep well last night.  Pt requiring extra time for activity this morning d/t L knee soreness (and being tired) but did well overall with mobility with pacing.  Therapist answered all of pt's and pt's wife's questions and both verbalizing good understanding.  Plan for HHPT and discharge home today.    Follow Up Recommendations  Home health PT     Equipment Recommendations  Rolling walker with 5" wheels    Recommendations for Other Services       Precautions / Restrictions Precautions Precautions: Knee;Fall Precaution Booklet Issued: Yes (comment) Required Braces or Orthoses: Knee Immobilizer - Left Knee Immobilizer - Right: Discontinue once straight leg raise with < 10 degree lag Restrictions Weight Bearing Restrictions: Yes LLE Weight Bearing: Weight bearing as tolerated    Mobility  Bed Mobility               General bed mobility comments: Pt declined bed mobility and reported no issues with getting in/out of bed.  Transfers Overall transfer level: Modified independent Equipment used: Rolling walker (2 wheeled) Transfers: Sit to/from [employer] Sit to Stand: Modified independent (Device/Increase time) Stand pivot transfers: Modified independent (Device/Increase time)       General transfer comment: steady with  transfers; no vc's required  Ambulation/Gait Ambulation/Gait assistance: Modified independent (Device/Increase time) Ambulation Distance (Feet): (50 feet; 180 feet) Assistive device: Rolling walker (2 wheeled)   Gait velocity: decreased   General Gait Details: decreased stance time L LE; occasional vc's to increase UE support through RW; step to pattern   Stairs Stairs: Yes     Number of Stairs: 4 General stair comments: B railings ascending forward; R railing descending sidestepping; steady and safe; no knee buckling noted  Wheelchair Mobility    Modified Rankin (Stroke Patients Only)       Balance Overall balance assessment: Modified Independent Sitting-balance support: No upper extremity supported;Feet supported Sitting balance-Leahy Scale: Normal Sitting balance - Comments: sitting reaching outside BOS   Standing balance support: No upper extremity supported Standing balance-Leahy Scale: Good Standing balance comment: standing reaching within BOS (mostly WB'ing through non-operative LE)                            Cognition Arousal/Alertness: Awake/alert Behavior During Therapy: WFL for tasks assessed/performed Overall Cognitive Status: Within Functional Limits for tasks assessed                                        Exercises Total Joint Exercises Long Arc Quad: AAROM;Strengthening;Left;10 reps;Seated Knee Flexion: AAROM;Left;10 reps;Seated Goniometric ROM: L knee extension 5 degrees short of neutral semi-supine in chair; L knee flexion 92 degrees AROM sitting in recliner  General Comments General comments (skin integrity, edema, etc.): L LE dressing in place with minimal drainage.  Pt agreeable to PT session.      Pertinent Vitals/Pain Pain Assessment: 0-10 Pain Score: 2 (7-8/10 with activity; 2/10 at rest) Pain Location: L knee Pain Descriptors / Indicators: Sore(feels "swollen") Pain Intervention(s): Limited activity  within patient's tolerance;Monitored during session;Premedicated before session;Repositioned;Ice applied  Vitals (HR and O2 on room air) stable and WFL throughout treatment session.    Home Living                      Prior Function            PT Goals (current goals can now be found in the care plan section) Acute Rehab PT Goals Patient Stated Goal: to go home PT Goal Formulation: With patient/family Time For Goal Achievement: 07/18/17 Potential to Achieve Goals: Good Additional Goals Additional Goal #1: Pt's L knee ROM 0-90 degrees. Progress towards PT goals: Progressing toward goals    Frequency    BID      PT Plan Current plan remains appropriate    Co-evaluation              AM-PAC PT "6 Clicks" Daily Activity  Outcome Measure  Difficulty turning over in bed (including adjusting bedclothes, sheets and blankets)?: A Little Difficulty moving from lying on back to sitting on the side of the bed? : A Little Difficulty sitting down on and standing up from a chair with arms (e.g., wheelchair, bedside commode, etc,.)?: A Little Help needed moving to and from a bed to chair (including a wheelchair)?: None Help needed walking in hospital room?: None Help needed climbing 3-5 steps with a railing? : A Little 6 Click Score: 20    End of Session Equipment Utilized During Treatment: Gait belt Activity Tolerance: Patient limited by pain Patient left: in chair;with call bell/phone within reach;with chair alarm set;with family/visitor present;with SCD's reapplied(B heels elevated via towel rolls; polar care in place and activated) Nurse Communication: Mobility status;Precautions;Weight bearing status PT Visit Diagnosis: Other abnormalities of gait and mobility (R26.89);Muscle weakness (generalized) (M62.81);Pain Pain - Right/Left: Left Pain - part of body: Knee     Time: 0857-1010 PT Time Calculation (min) (ACUTE ONLY): 73 min  Charges:  $Gait Training:  23-37 mins $Therapeutic Exercise: 23-37 mins $Therapeutic Activity: 8-22 mins                    G CodesLeitha Bleak, PT 07/06/17, 12:39 PM [phone]

## 2017-07-07 DIAGNOSIS — I480 Paroxysmal atrial fibrillation: Secondary | ICD-10-CM | POA: Diagnosis not present

## 2017-07-07 DIAGNOSIS — Z471 Aftercare following joint replacement surgery: Secondary | ICD-10-CM | POA: Diagnosis not present

## 2017-07-07 DIAGNOSIS — Z951 Presence of aortocoronary bypass graft: Secondary | ICD-10-CM | POA: Diagnosis not present

## 2017-07-07 DIAGNOSIS — I2510 Atherosclerotic heart disease of native coronary artery without angina pectoris: Secondary | ICD-10-CM | POA: Diagnosis not present

## 2017-07-07 DIAGNOSIS — K219 Gastro-esophageal reflux disease without esophagitis: Secondary | ICD-10-CM | POA: Diagnosis not present

## 2017-07-07 DIAGNOSIS — I10 Essential (primary) hypertension: Secondary | ICD-10-CM | POA: Diagnosis not present

## 2017-07-07 DIAGNOSIS — Z96652 Presence of left artificial knee joint: Secondary | ICD-10-CM | POA: Diagnosis not present

## 2017-07-08 DIAGNOSIS — I2510 Atherosclerotic heart disease of native coronary artery without angina pectoris: Secondary | ICD-10-CM | POA: Diagnosis not present

## 2017-07-08 DIAGNOSIS — Z96652 Presence of left artificial knee joint: Secondary | ICD-10-CM | POA: Diagnosis not present

## 2017-07-08 DIAGNOSIS — I480 Paroxysmal atrial fibrillation: Secondary | ICD-10-CM | POA: Diagnosis not present

## 2017-07-08 DIAGNOSIS — K219 Gastro-esophageal reflux disease without esophagitis: Secondary | ICD-10-CM | POA: Diagnosis not present

## 2017-07-08 DIAGNOSIS — Z471 Aftercare following joint replacement surgery: Secondary | ICD-10-CM | POA: Diagnosis not present

## 2017-07-08 DIAGNOSIS — Z951 Presence of aortocoronary bypass graft: Secondary | ICD-10-CM | POA: Diagnosis not present

## 2017-07-08 DIAGNOSIS — I10 Essential (primary) hypertension: Secondary | ICD-10-CM | POA: Diagnosis not present

## 2017-07-11 DIAGNOSIS — K219 Gastro-esophageal reflux disease without esophagitis: Secondary | ICD-10-CM | POA: Diagnosis not present

## 2017-07-11 DIAGNOSIS — Z471 Aftercare following joint replacement surgery: Secondary | ICD-10-CM | POA: Diagnosis not present

## 2017-07-11 DIAGNOSIS — Z951 Presence of aortocoronary bypass graft: Secondary | ICD-10-CM | POA: Diagnosis not present

## 2017-07-11 DIAGNOSIS — Z96652 Presence of left artificial knee joint: Secondary | ICD-10-CM | POA: Diagnosis not present

## 2017-07-11 DIAGNOSIS — I10 Essential (primary) hypertension: Secondary | ICD-10-CM | POA: Diagnosis not present

## 2017-07-11 DIAGNOSIS — I480 Paroxysmal atrial fibrillation: Secondary | ICD-10-CM | POA: Diagnosis not present

## 2017-07-11 DIAGNOSIS — I2510 Atherosclerotic heart disease of native coronary artery without angina pectoris: Secondary | ICD-10-CM | POA: Diagnosis not present

## 2017-07-12 DIAGNOSIS — Z951 Presence of aortocoronary bypass graft: Secondary | ICD-10-CM | POA: Diagnosis not present

## 2017-07-12 DIAGNOSIS — K219 Gastro-esophageal reflux disease without esophagitis: Secondary | ICD-10-CM | POA: Diagnosis not present

## 2017-07-12 DIAGNOSIS — I10 Essential (primary) hypertension: Secondary | ICD-10-CM | POA: Diagnosis not present

## 2017-07-12 DIAGNOSIS — Z96652 Presence of left artificial knee joint: Secondary | ICD-10-CM | POA: Diagnosis not present

## 2017-07-12 DIAGNOSIS — I480 Paroxysmal atrial fibrillation: Secondary | ICD-10-CM | POA: Diagnosis not present

## 2017-07-12 DIAGNOSIS — Z471 Aftercare following joint replacement surgery: Secondary | ICD-10-CM | POA: Diagnosis not present

## 2017-07-12 DIAGNOSIS — I2510 Atherosclerotic heart disease of native coronary artery without angina pectoris: Secondary | ICD-10-CM | POA: Diagnosis not present

## 2017-07-14 DIAGNOSIS — K219 Gastro-esophageal reflux disease without esophagitis: Secondary | ICD-10-CM | POA: Diagnosis not present

## 2017-07-14 DIAGNOSIS — Z951 Presence of aortocoronary bypass graft: Secondary | ICD-10-CM | POA: Diagnosis not present

## 2017-07-14 DIAGNOSIS — I10 Essential (primary) hypertension: Secondary | ICD-10-CM | POA: Diagnosis not present

## 2017-07-14 DIAGNOSIS — I480 Paroxysmal atrial fibrillation: Secondary | ICD-10-CM | POA: Diagnosis not present

## 2017-07-14 DIAGNOSIS — Z96652 Presence of left artificial knee joint: Secondary | ICD-10-CM | POA: Diagnosis not present

## 2017-07-14 DIAGNOSIS — I2510 Atherosclerotic heart disease of native coronary artery without angina pectoris: Secondary | ICD-10-CM | POA: Diagnosis not present

## 2017-07-14 DIAGNOSIS — Z471 Aftercare following joint replacement surgery: Secondary | ICD-10-CM | POA: Diagnosis not present

## 2017-07-15 DIAGNOSIS — K219 Gastro-esophageal reflux disease without esophagitis: Secondary | ICD-10-CM | POA: Diagnosis not present

## 2017-07-15 DIAGNOSIS — I480 Paroxysmal atrial fibrillation: Secondary | ICD-10-CM | POA: Diagnosis not present

## 2017-07-15 DIAGNOSIS — I10 Essential (primary) hypertension: Secondary | ICD-10-CM | POA: Diagnosis not present

## 2017-07-15 DIAGNOSIS — Z96652 Presence of left artificial knee joint: Secondary | ICD-10-CM | POA: Diagnosis not present

## 2017-07-15 DIAGNOSIS — I2510 Atherosclerotic heart disease of native coronary artery without angina pectoris: Secondary | ICD-10-CM | POA: Diagnosis not present

## 2017-07-15 DIAGNOSIS — Z951 Presence of aortocoronary bypass graft: Secondary | ICD-10-CM | POA: Diagnosis not present

## 2017-07-15 DIAGNOSIS — Z01810 Encounter for preprocedural cardiovascular examination: Secondary | ICD-10-CM | POA: Diagnosis not present

## 2017-07-15 DIAGNOSIS — Z471 Aftercare following joint replacement surgery: Secondary | ICD-10-CM | POA: Diagnosis not present

## 2017-07-18 DIAGNOSIS — Z471 Aftercare following joint replacement surgery: Secondary | ICD-10-CM | POA: Diagnosis not present

## 2017-07-18 DIAGNOSIS — Z96652 Presence of left artificial knee joint: Secondary | ICD-10-CM | POA: Diagnosis not present

## 2017-07-18 DIAGNOSIS — I480 Paroxysmal atrial fibrillation: Secondary | ICD-10-CM | POA: Diagnosis not present

## 2017-07-18 DIAGNOSIS — I10 Essential (primary) hypertension: Secondary | ICD-10-CM | POA: Diagnosis not present

## 2017-07-18 DIAGNOSIS — Z951 Presence of aortocoronary bypass graft: Secondary | ICD-10-CM | POA: Diagnosis not present

## 2017-07-18 DIAGNOSIS — K219 Gastro-esophageal reflux disease without esophagitis: Secondary | ICD-10-CM | POA: Diagnosis not present

## 2017-07-18 DIAGNOSIS — I2510 Atherosclerotic heart disease of native coronary artery without angina pectoris: Secondary | ICD-10-CM | POA: Diagnosis not present

## 2017-07-19 DIAGNOSIS — R29898 Other symptoms and signs involving the musculoskeletal system: Secondary | ICD-10-CM | POA: Diagnosis not present

## 2017-07-19 DIAGNOSIS — M25562 Pain in left knee: Secondary | ICD-10-CM | POA: Diagnosis not present

## 2017-07-19 DIAGNOSIS — M25662 Stiffness of left knee, not elsewhere classified: Secondary | ICD-10-CM | POA: Diagnosis not present

## 2017-07-19 DIAGNOSIS — Z96652 Presence of left artificial knee joint: Secondary | ICD-10-CM | POA: Diagnosis not present

## 2017-07-20 DIAGNOSIS — E785 Hyperlipidemia, unspecified: Secondary | ICD-10-CM | POA: Diagnosis not present

## 2017-07-20 DIAGNOSIS — I480 Paroxysmal atrial fibrillation: Secondary | ICD-10-CM | POA: Diagnosis not present

## 2017-07-20 DIAGNOSIS — Z951 Presence of aortocoronary bypass graft: Secondary | ICD-10-CM | POA: Diagnosis not present

## 2017-07-25 DIAGNOSIS — Z96652 Presence of left artificial knee joint: Secondary | ICD-10-CM | POA: Diagnosis not present

## 2017-07-25 DIAGNOSIS — M25562 Pain in left knee: Secondary | ICD-10-CM | POA: Diagnosis not present

## 2017-07-27 ENCOUNTER — Telehealth: Payer: Self-pay

## 2017-07-27 DIAGNOSIS — Z96652 Presence of left artificial knee joint: Secondary | ICD-10-CM | POA: Diagnosis not present

## 2017-07-27 NOTE — Telephone Encounter (Signed)
Copied from [place] #[id number]. Topic: Quick Communication - Office Called Patient >> Jun 28, 2017  1:07 PM Karene Fry P wrote: Reason for CRM: Office called pt and left voice message for him to return call. Need to inform him that he need to see his cardiologist for surgical clearance. We also need to know who is cardiologist is.

## 2017-07-27 NOTE — Telephone Encounter (Signed)
This was taken care by patient's cardiologist on 07/07/2017 it is scanned in his chart from Dr. Saralyn Pilar office at [facility]. Dr. Saralyn Pilar faxed over clearance to Dr. Clydell Hakim office.

## 2017-07-29 DIAGNOSIS — M25562 Pain in left knee: Secondary | ICD-10-CM | POA: Diagnosis not present

## 2017-07-29 DIAGNOSIS — Z96652 Presence of left artificial knee joint: Secondary | ICD-10-CM | POA: Diagnosis not present

## 2017-08-01 DIAGNOSIS — Z96652 Presence of left artificial knee joint: Secondary | ICD-10-CM | POA: Diagnosis not present

## 2017-08-03 DIAGNOSIS — Z96652 Presence of left artificial knee joint: Secondary | ICD-10-CM | POA: Diagnosis not present

## 2017-08-03 DIAGNOSIS — M25562 Pain in left knee: Secondary | ICD-10-CM | POA: Diagnosis not present

## 2017-08-05 DIAGNOSIS — Z96652 Presence of left artificial knee joint: Secondary | ICD-10-CM | POA: Diagnosis not present

## 2017-08-10 DIAGNOSIS — Z96652 Presence of left artificial knee joint: Secondary | ICD-10-CM | POA: Diagnosis not present

## 2017-08-12 DIAGNOSIS — Z96652 Presence of left artificial knee joint: Secondary | ICD-10-CM | POA: Diagnosis not present

## 2017-08-15 DIAGNOSIS — Z96652 Presence of left artificial knee joint: Secondary | ICD-10-CM | POA: Diagnosis not present

## 2017-08-16 DIAGNOSIS — Z96652 Presence of left artificial knee joint: Secondary | ICD-10-CM | POA: Diagnosis not present

## 2017-11-21 DIAGNOSIS — J181 Lobar pneumonia, unspecified organism: Secondary | ICD-10-CM | POA: Diagnosis not present

## 2017-11-21 DIAGNOSIS — R0989 Other specified symptoms and signs involving the circulatory and respiratory systems: Secondary | ICD-10-CM | POA: Diagnosis not present

## 2017-11-21 DIAGNOSIS — R05 Cough: Secondary | ICD-10-CM | POA: Diagnosis not present

## 2018-01-13 ENCOUNTER — Encounter: Payer: Medicare HMO | Admitting: Family Medicine

## 2018-01-27 ENCOUNTER — Ambulatory Visit (INDEPENDENT_AMBULATORY_CARE_PROVIDER_SITE_OTHER): Payer: Medicare HMO | Admitting: Family Medicine

## 2018-01-27 ENCOUNTER — Encounter: Payer: Self-pay | Admitting: Family Medicine

## 2018-01-27 ENCOUNTER — Ambulatory Visit (INDEPENDENT_AMBULATORY_CARE_PROVIDER_SITE_OTHER): Payer: Medicare HMO

## 2018-01-27 VITALS — BP 128/60 | HR 86 | Temp 98.4°F | Resp 12 | Ht 70.0 in | Wt 251.8 lb

## 2018-01-27 DIAGNOSIS — E785 Hyperlipidemia, unspecified: Secondary | ICD-10-CM

## 2018-01-27 DIAGNOSIS — Z0000 Encounter for general adult medical examination without abnormal findings: Secondary | ICD-10-CM | POA: Diagnosis not present

## 2018-01-27 NOTE — Patient Instructions (Signed)
Darius Grimes , Thank you for taking time to come for your Medicare Wellness Visit. I appreciate your ongoing commitment to your health goals. Please review the following plan we discussed and let me know if I can assist you in the future.   Screening recommendations/referrals: Colorectal Screening: Up to date Lung Cancer Screening: You do not qualify for this screening Hepatitis C Screening: Up to date  Vision and Dental Exams: Recommended annual ophthalmology exams for early detection of glaucoma and other disorders of the eye Recommended annual dental exams for proper oral hygiene  Vaccinations: Influenza vaccine: Up to date Pneumococcal vaccine: Up to date Tdap vaccine: Declined. Please call your insurance company to determine your out of pocket expense. You may also receive this vaccine at your local pharmacy or Health Dept. Shingles vaccine: Please call your insurance company to determine your out of pocket expense for the Shingrix vaccine. You may also receive this vaccine at your local pharmacy or Health Dept.    Advanced directives: Advance directive discussed with you today. I have provided a copy for you to complete at home and have notarized. Once this is complete please bring a copy in to our office so we can scan it into your chart.  Conditions/risks identified: Recommend to drink at least 6-8 8oz glasses of water per day.  Next appointment: Please schedule your Annual Wellness Visit with your Nurse Health Advisor in one year.  Preventive Care 71 Years and Older, Male Preventive care refers to lifestyle choices and visits with your health care provider that can promote health and wellness. What does preventive care include?  A yearly physical exam. This is also called an annual well check.  Dental exams once or twice a year.  Routine eye exams. Ask your health care provider how often you should have your eyes checked.  Personal lifestyle choices, including:  Daily  care of your teeth and gums.  Regular physical activity.  Eating a healthy diet.  Avoiding tobacco and drug use.  Limiting alcohol use.  Practicing safe sex.  Taking low doses of aspirin every day.  Taking vitamin and mineral supplements as recommended by your health care provider. What happens during an annual well check? The services and screenings done by your health care provider during your annual well check will depend on your age, overall health, lifestyle risk factors, and family history of disease. Counseling  Your health care provider may ask you questions about your:  Alcohol use.  Tobacco use.  Drug use.  Emotional well-being.  Home and relationship well-being.  Sexual activity.  Eating habits.  History of falls.  Memory and ability to understand (cognition).  Work and work Statistician. Screening  You may have the following tests or measurements:  Height, weight, and BMI.  Blood pressure.  Lipid and cholesterol levels. These may be checked every 5 years, or more frequently if you are over 4 years old.  Skin check.  Lung cancer screening. You may have this screening every year starting at age 71 if you have a 30-pack-year history of smoking and currently smoke or have quit within the past 15 years.  Fecal occult blood test (FOBT) of the stool. You may have this test every year starting at age 71.  Flexible sigmoidoscopy or colonoscopy. You may have a sigmoidoscopy every 5 years or a colonoscopy every 10 years starting at age 71.  Prostate cancer screening. Recommendations will vary depending on your family history and other risks.  Hepatitis C blood test.  Hepatitis B blood test.  Sexually transmitted disease (STD) testing.  Diabetes screening. This is done by checking your blood sugar (glucose) after you have not eaten for a while (fasting). You may have this done every 1-3 years.  Abdominal aortic aneurysm (AAA) screening. You may need  this if you are a current or former smoker.  Osteoporosis. You may be screened starting at age 71 if you are at high risk. Talk with your health care provider about your test results, treatment options, and if necessary, the need for more tests. Vaccines  Your health care provider may recommend certain vaccines, such as:  Influenza vaccine. This is recommended every year.  Tetanus, diphtheria, and acellular pertussis (Tdap, Td) vaccine. You may need a Td booster every 10 years.  Zoster vaccine. You may need this after age 71.  Pneumococcal 13-valent conjugate (PCV13) vaccine. One dose is recommended after age 71.  Pneumococcal polysaccharide (PPSV23) vaccine. One dose is recommended after age 71. Talk to your health care provider about which screenings and vaccines you need and how often you need them. This information is not intended to replace advice given to you by your health care provider. Make sure you discuss any questions you have with your health care provider. Document Released: 09/12/2015 Document Revised: 05/05/2016 Document Reviewed: 06/17/2015 Elsevier Interactive Patient Education  2017 [place] Prevention in the Home Falls can cause injuries. They can happen to people of all ages. There are many things you can do to make your home safe and to help prevent falls. What can I do on the outside of my home?  Regularly fix the edges of walkways and driveways and fix any cracks.  Remove anything that might make you trip as you walk through a door, such as a raised step or threshold.  Trim any bushes or trees on the path to your home.  Use bright outdoor lighting.  Clear any walking paths of anything that might make someone trip, such as rocks or tools.  Regularly check to see if handrails are loose or broken. Make sure that both sides of any steps have handrails.  Any raised decks and porches should have guardrails on the edges.  Have any leaves, snow, or  ice cleared regularly.  Use sand or salt on walking paths during winter.  Clean up any spills in your garage right away. This includes oil or grease spills. What can I do in the bathroom?  Use night lights.  Install grab bars by the toilet and in the tub and shower. Do not use towel bars as grab bars.  Use non-skid mats or decals in the tub or shower.  If you need to sit down in the shower, use a plastic, non-slip stool.  Keep the floor dry. Clean up any water that spills on the floor as soon as it happens.  Remove soap buildup in the tub or shower regularly.  Attach bath mats securely with double-sided non-slip rug tape.  Do not have throw rugs and other things on the floor that can make you trip. What can I do in the bedroom?  Use night lights.  Make sure that you have a light by your bed that is easy to reach.  Do not use any sheets or blankets that are too big for your bed. They should not hang down onto the floor.  Have a firm chair that has side arms. You can use this for support while you get dressed.  Do not  have throw rugs and other things on the floor that can make you trip. What can I do in the kitchen?  Clean up any spills right away.  Avoid walking on wet floors.  Keep items that you use a lot in easy-to-reach places.  If you need to reach something above you, use a strong step stool that has a grab bar.  Keep electrical cords out of the way.  Do not use floor polish or wax that makes floors slippery. If you must use wax, use non-skid floor wax.  Do not have throw rugs and other things on the floor that can make you trip. What can I do with my stairs?  Do not leave any items on the stairs.  Make sure that there are handrails on both sides of the stairs and use them. Fix handrails that are broken or loose. Make sure that handrails are as long as the stairways.  Check any carpeting to make sure that it is firmly attached to the stairs. Fix any carpet  that is loose or worn.  Avoid having throw rugs at the top or bottom of the stairs. If you do have throw rugs, attach them to the floor with carpet tape.  Make sure that you have a light switch at the top of the stairs and the bottom of the stairs. If you do not have them, ask someone to add them for you. What else can I do to help prevent falls?  Wear shoes that:  Do not have high heels.  Have rubber bottoms.  Are comfortable and fit you well.  Are closed at the toe. Do not wear sandals.  If you use a stepladder:  Make sure that it is fully opened. Do not climb a closed stepladder.  Make sure that both sides of the stepladder are locked into place.  Ask someone to hold it for you, if possible.  Clearly mark and make sure that you can see:  Any grab bars or handrails.  First and last steps.  Where the edge of each step is.  Use tools that help you move around (mobility aids) if they are needed. These include:  Canes.  Walkers.  Scooters.  Crutches.  Turn on the lights when you go into a dark area. Replace any light bulbs as soon as they burn out.  Set up your furniture so you have a clear path. Avoid moving your furniture around.  If any of your floors are uneven, fix them.  If there are any pets around you, be aware of where they are.  Review your medicines with your doctor. Some medicines can make you feel dizzy. This can increase your chance of falling. Ask your doctor what other things that you can do to help prevent falls. This information is not intended to replace advice given to you by your health care provider. Make sure you discuss any questions you have with your health care provider. Document Released: 06/12/2009 Document Revised: 01/22/2016 Document Reviewed: 09/20/2014 Elsevier Interactive Patient Education  2017 [employer].

## 2018-01-27 NOTE — Progress Notes (Addendum)
Subjective:   Darius Grimes is a 72 y.o. male who presents for Medicare Annual/Subsequent preventive examination.  Review of Systems:  N/A Cardiac Risk Factors include: advanced age (>6men, >34 women);dyslipidemia;hypertension;male gender;obesity (BMI >30kg/m2);sedentary lifestyle     Objective:    Vitals: BP 128/60 (BP Location: Left Arm, Patient Position: Sitting, Cuff Size: Large)   Pulse 86   Temp 98.4 F (36.9 C) (Oral)   Resp 12   Ht 5\' 10"  (1.778 m)   Wt 251 lb 12.8 oz (114.2 kg)   SpO2 92%   BMI 36.13 kg/m   Body mass index is 36.13 kg/m.  Advanced Directives 01/27/2018 07/04/2017 07/04/2017 06/22/2017 04/22/2017 12/24/2016 10/22/2016  Does Patient Have a Medical Advance Directive? Yes No No No No No No  Does patient want to make changes to medical advance directive? Yes (MAU/Ambulatory/Procedural Areas - Information given) - - - - - -  Would patient like information on creating a medical advance directive? - No - Patient declined No - Patient declined No - Patient declined - - -    Tobacco Social History   Tobacco Use  Smoking Status Former Smoker  . Packs/day: 1.00  . Years: 7.00  . Pack years: 7.00  . Types: Cigarettes  . Last attempt to quit: 08/31/1983  . Years since quitting: 34.4  Smokeless Tobacco Never Used  Tobacco Comment   smoking cessation materials not required     Counseling given: No Comment: smoking cessation materials not required  Clinical Intake:  Pre-visit preparation completed: Yes  Pain : 0-10 Pain Score: 5  Pain Location: Neck    States he is having neck pain that has been going on for the past few days. States he will talk to Dr. Ancil Boozer today about his pain.  BMI - recorded: 36.13 Nutritional Status: BMI > 30  Obese Nutritional Risks: None Diabetes: No  How often do you need to have someone help you when you read instructions, pamphlets, or other written materials from your doctor or pharmacy?: 1 - Never  Interpreter  Needed?: No  Information entered by :: AEversole, LPN  Hospitalizations/ED visits and surgeries occurring within the previous 12 months:  Within the previous 12 months, pt has not been hospitalized for any conditions and has not been treated by an emergency room clinician. However, pt did undergo LTKR with Dr. Marry Guan on 07/04/17 @ [place].  Past Medical History:  Diagnosis Date  . Acid reflux   . Allergic rhinitis   . Arthritis   . Coronary artery disease   . Dysrhythmia   . Episodic atrial fibrillation ([place])   . GERD (gastroesophageal reflux disease)   . Heart palpitations   . History of kidney stones   . Hyperlipidemia   . Hypertension   . Kidney stones   . Neck pain   . Recurrent nephrolithiasis   . Skin lesion of back    Past Surgical History:  Procedure Laterality Date  . CARPAL TUNNEL RELEASE Left   . CORONARY ARTERY BYPASS GRAFT    . HYDROCELE EXCISION / REPAIR    . KNEE ARTHROPLASTY Left 07/04/2017   Procedure: COMPUTER ASSISTED TOTAL KNEE ARTHROPLASTY;  Surgeon: Dereck Leep, MD;  Location: [facility] ORS;  Service: Orthopedics;  Laterality: Left;  . PROSTATE SURGERY    . THROAT SURGERY    . URETERAL STENT PLACEMENT     at the [place]  . Vocal cord poylp removal     Family History  Problem Relation Age of Onset  .  CVA Mother   . Hypotension Mother   . CAD Father   . Heart attack Father   . Hypertension Sister   . Hypertension Brother   . Obesity Brother    Social History   Socioeconomic History  . Marital status: Married    Spouse name: [name]  . Number of children: 2  . Years of education: Not on file  . Highest education level: 12th grade  Occupational History    Employer: [employer]  Social Needs  . Financial resource strain: Not hard at all  . Food insecurity:    Worry: Never true    Inability: Never true  . Transportation needs:    Medical: No    Non-medical: No  Tobacco Use  . Smoking status: Former Smoker    Packs/day: 1.00    Years: 7.00     Pack years: 7.00    Types: Cigarettes    Last attempt to quit: 08/31/1983    Years since quitting: 34.4  . Smokeless tobacco: Never Used  . Tobacco comment: smoking cessation materials not required  Substance and Sexual Activity  . Alcohol use: No    Alcohol/week: 0.0 oz  . Drug use: No  . Sexual activity: Yes    Partners: Female  Lifestyle  . Physical activity:    Days per week: 0 days    Minutes per session: 0 min  . Stress: Not at all  Relationships  . Social connections:    Talks on phone: Patient refused    Gets together: Patient refused    Attends religious service: Patient refused    Active member of club or organization: Patient refused    Attends meetings of clubs or organizations: Patient refused    Relationship status: Married  Other Topics Concern  . 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     Outpatient Encounter Medications as of 01/27/2018  Medication Sig  . amLODipine (NORVASC) 5 MG tablet Take 5 mg by mouth 2 (two) times daily.  [name] aspirin 81 MG tablet Take 81 mg by mouth daily.   . cholecalciferol (VITAMIN D) 1000 units tablet Take 5,000 Units by mouth 2 (two) times daily.   . finasteride (PROSCAR) 5 MG tablet Take 5 mg by mouth daily.  . furosemide (LASIX) 20 MG tablet Take 20 mg by mouth 2 (two) times daily.  [name] lisinopril (PRINIVIL,ZESTRIL) 40 MG tablet Take 40 mg by mouth daily.  . metoprolol tartrate (LOPRESSOR) 12.5 mg TABS tablet Take 12.5 mg by mouth 2 (two) times daily.  . Multiple Vitamin (MULTIVITAMIN WITH MINERALS) TABS tablet Take 1 tablet by mouth daily.  . Omega-3 Fatty Acids (FISH OIL) 1000 MG CAPS Take 1 capsule by mouth daily.  . potassium chloride (KLOR-CON) 8 MEQ tablet Take 16 mEq by mouth 2 (two) times daily.   . ranitidine (ZANTAC) 150 MG tablet Take 150 mg by mouth 2 (two) times daily.   . rosuvastatin  (CRESTOR) 10 MG tablet Take 5 mg by mouth daily.  . tamsulosin (FLOMAX) 0.4 MG CAPS capsule Take 0.4 mg by mouth daily.   [name] docusate sodium (COLACE) 50 MG capsule Take 50 mg by mouth 2 (two) times daily.  [name] enoxaparin (LOVENOX) 40 MG/0.4ML injection Inject 0.4 mLs (40 mg total) daily into the skin.  . fluticasone (FLONASE) 50 MCG/ACT nasal  spray Place 2 sprays into both nostrils at bedtime.  . mirabegron ER (MYRBETRIQ) 25 MG TB24 tablet Take 25 mg by mouth daily.  [name] oxyCODONE (OXY IR/ROXICODONE) 5 MG immediate release tablet Take 1 tablet (5 mg total) every 3 (three) hours as needed by mouth for moderate pain ((score 4 to 6)).  . pravastatin (PRAVACHOL) 40 MG tablet Take 20 mg by mouth daily. Takes irregular  . traMADol (ULTRAM) 50 MG tablet Take 50-100 mg by mouth every 6 (six) hours as needed for moderate pain or severe pain.  . Turmeric 500 MG CAPS Take 1 capsule by mouth once.  . vitamin B-12 (CYANOCOBALAMIN) 1000 MCG tablet Take 1,000 mcg by mouth daily.   No facility-administered encounter medications on file as of 01/27/2018.     Activities of Daily Living In your present state of health, do you have any difficulty performing the following activities: 01/27/2018 07/04/2017  Hearing? N -  Comment B hearing aids -  Vision? N -  Comment wears eyeglasses -  Difficulty concentrating or making decisions? N -  Walking or climbing stairs? N -  Dressing or bathing? N -  Doing errands, shopping? N N  Preparing Food and eating ? N -  Comment full set upper and lower dentures -  Using the Toilet? N -  In the past six months, have you accidently leaked urine? N -  Do you have problems with loss of bowel control? N -  Managing your Medications? N -  Managing your Finances? N -  Housekeeping or managing your Housekeeping? N -  Some recent data might be hidden    Patient Care Team: Steele Sizer, MD as PCP - General (Family Medicine)   Assessment:   This is a routine wellness examination  for Kymir.  Exercise Activities and Dietary recommendations Current Exercise Habits: The patient does not participate in regular exercise at present, Exercise limited by: None identified  Goals    . DIET - INCREASE WATER INTAKE     Recommend to drink at least 6-8 8oz glasses of water per day.       Fall Risk Fall Risk  01/27/2018 10/22/2016 04/23/2016 09/26/2015 07/18/2015  Falls in the past year? No No No No No  Risk for fall due to : Impaired vision - - - -  Risk for fall due to: Comment wears eyeglasses - - - -   FALL RISK PREVENTION PERTAINING TO HOME: Is your home free of loose throw rugs in walkways, pet beds, electrical cords, etc? Yes Is there adequate lighting in your home to reduce risk of falls?  Yes Are there stairs in or around your home WITH handrails? Yes  ASSISTIVE DEVICES UTILIZED TO PREVENT FALLS: Use of a cane, walker or w/c? No Grab bars in the bathroom? No  Shower chair or a place to sit while bathing? No An elevated toilet seat or a handicapped toilet? No  Timed Get Up and Go Performed: Yes. Pt ambulated 10 feet within 22 sec. Gait slow, steady and without the use of an assistive device. No intervention required at this time. Fall risk prevention has been discussed.  Community Resource Referral:  Pt declined my offer to send [employer] Referral to Care Guide for installation of grab bars in the shower, shower chair or an elevated toilet seat.  Depression Screen PHQ 2/9 Scores 01/27/2018 10/22/2016 04/23/2016 09/26/2015  PHQ - 2 Score 0 0 0 0  PHQ- 9 Score 0 - - -  Cognitive Function     6CIT Screen 01/27/2018  What Year? 0 points  What month? 0 points  What time? 0 points  Count back from 20 0 points  Months in reverse 0 points  Repeat phrase 4 points  Total Score 4    Immunization History  Administered Date(s) Administered  . Influenza, High Dose Seasonal PF 10/22/2016, 04/22/2017  . Influenza-Unspecified 06/23/2012, 07/20/2015  .  Pneumococcal Conjugate-13 09/26/2015  . Pneumococcal Polysaccharide-23 11/29/2011  . Zoster 08/31/2011    Qualifies for Shingles Vaccine? Yes. Zostavax completed 08/31/11. Due for Shingrix. Education has been provided regarding the importance of this vaccine. Pt has been advised to call his insurance company to determine his out of pocket expense. Advised he may also receive this vaccine at his local pharmacy or Health Dept. Verbalized acceptance and understanding.  Due for Tdap vaccine. Education has been provided regarding the importance of this vaccine. Pt has been advised he may receive this vaccine at his local pharmacy or Health Dept. Also advised to provide a copy of his vaccination record if he chooses to receive this vaccine at his local pharmacy. Verbalized acceptance and understanding.  Screening Tests Health Maintenance  Topic Date Due  . TETANUS/TDAP  01/28/2019 (Originally 10/30/1964)  . INFLUENZA VACCINE  03/30/2018  . COLONOSCOPY  08/30/2020  . Hepatitis C Screening  Completed  . PNA vac Low Risk Adult  Completed   Cancer Screenings: Lung: Low Dose CT Chest recommended if Age 90-80 years, 30 pack-year currently smoking OR have quit w/in 15years. Patient does not qualify. Colorectal: Completed 08/30/10. Repeat every 10 years  Additional Screenings: Hepatitis C Screening: Completed 06/23/12     Plan:  I have personally reviewed and addressed the Medicare Annual Wellness questionnaire and have noted the following in the patient's chart:  A. Medical and social history B. Use of alcohol, tobacco or illicit drugs  C. Current medications and supplements D. Functional ability and status E.  Nutritional status F.  Physical activity G. Advance directives H. List of other physicians I.  Hospitalizations, surgeries, and ER visits in previous 12 months J.  [place] such as hearing and vision if needed, cognitive and depression L. Referrals and appointments  In  addition, I have reviewed and discussed with patient certain preventive protocols, quality metrics, and best practice recommendations. A written personalized care plan for preventive services as well as general preventive health recommendations were provided to patient.  See attached scanned questionnaire for additional information.   Signed,  Aleatha Borer, LPN Nurse Health Advisor  I have reviewed this encounter including the documentation in this note and/or discussed this patient with the provider, Aleatha Borer, LPN. I am certifying that I agree with the content of this note as supervising physician.  Steele Sizer, MD [facility] 01/27/2018, 10:23 AM

## 2018-01-29 NOTE — Progress Notes (Signed)
Not seen

## 2018-02-27 DIAGNOSIS — M542 Cervicalgia: Secondary | ICD-10-CM | POA: Diagnosis not present

## 2018-02-27 DIAGNOSIS — I10 Essential (primary) hypertension: Secondary | ICD-10-CM | POA: Diagnosis not present

## 2018-02-27 DIAGNOSIS — Z6836 Body mass index (BMI) 36.0-36.9, adult: Secondary | ICD-10-CM | POA: Diagnosis not present

## 2018-02-27 DIAGNOSIS — N400 Enlarged prostate without lower urinary tract symptoms: Secondary | ICD-10-CM | POA: Diagnosis not present

## 2018-02-27 DIAGNOSIS — K219 Gastro-esophageal reflux disease without esophagitis: Secondary | ICD-10-CM | POA: Diagnosis not present

## 2018-02-27 DIAGNOSIS — I2510 Atherosclerotic heart disease of native coronary artery without angina pectoris: Secondary | ICD-10-CM | POA: Diagnosis not present

## 2018-03-06 DIAGNOSIS — M542 Cervicalgia: Secondary | ICD-10-CM | POA: Diagnosis not present

## 2018-03-31 DIAGNOSIS — K219 Gastro-esophageal reflux disease without esophagitis: Secondary | ICD-10-CM | POA: Diagnosis not present

## 2018-03-31 DIAGNOSIS — Z6836 Body mass index (BMI) 36.0-36.9, adult: Secondary | ICD-10-CM | POA: Diagnosis not present

## 2018-03-31 DIAGNOSIS — Z0001 Encounter for general adult medical examination with abnormal findings: Secondary | ICD-10-CM | POA: Diagnosis not present

## 2018-03-31 DIAGNOSIS — M542 Cervicalgia: Secondary | ICD-10-CM | POA: Diagnosis not present

## 2018-03-31 DIAGNOSIS — I10 Essential (primary) hypertension: Secondary | ICD-10-CM | POA: Diagnosis not present

## 2018-03-31 DIAGNOSIS — I2510 Atherosclerotic heart disease of native coronary artery without angina pectoris: Secondary | ICD-10-CM | POA: Diagnosis not present

## 2018-04-03 DIAGNOSIS — E559 Vitamin D deficiency, unspecified: Secondary | ICD-10-CM | POA: Diagnosis not present

## 2018-04-03 DIAGNOSIS — I10 Essential (primary) hypertension: Secondary | ICD-10-CM | POA: Diagnosis not present

## 2018-04-03 DIAGNOSIS — E538 Deficiency of other specified B group vitamins: Secondary | ICD-10-CM | POA: Diagnosis not present

## 2018-04-03 DIAGNOSIS — N400 Enlarged prostate without lower urinary tract symptoms: Secondary | ICD-10-CM | POA: Diagnosis not present

## 2018-04-03 DIAGNOSIS — Z125 Encounter for screening for malignant neoplasm of prostate: Secondary | ICD-10-CM | POA: Diagnosis not present

## 2018-04-03 DIAGNOSIS — Z6836 Body mass index (BMI) 36.0-36.9, adult: Secondary | ICD-10-CM | POA: Diagnosis not present

## 2018-04-03 DIAGNOSIS — K219 Gastro-esophageal reflux disease without esophagitis: Secondary | ICD-10-CM | POA: Diagnosis not present

## 2018-04-03 DIAGNOSIS — R7303 Prediabetes: Secondary | ICD-10-CM | POA: Diagnosis not present

## 2018-04-03 DIAGNOSIS — M542 Cervicalgia: Secondary | ICD-10-CM | POA: Diagnosis not present

## 2018-04-03 DIAGNOSIS — I2510 Atherosclerotic heart disease of native coronary artery without angina pectoris: Secondary | ICD-10-CM | POA: Diagnosis not present

## 2018-04-03 DIAGNOSIS — E781 Pure hyperglyceridemia: Secondary | ICD-10-CM | POA: Diagnosis not present

## 2018-04-06 DIAGNOSIS — I10 Essential (primary) hypertension: Secondary | ICD-10-CM | POA: Diagnosis not present

## 2018-04-06 DIAGNOSIS — N400 Enlarged prostate without lower urinary tract symptoms: Secondary | ICD-10-CM | POA: Diagnosis not present

## 2018-04-06 DIAGNOSIS — M542 Cervicalgia: Secondary | ICD-10-CM | POA: Diagnosis not present

## 2018-04-06 DIAGNOSIS — R7303 Prediabetes: Secondary | ICD-10-CM | POA: Diagnosis not present

## 2018-04-06 DIAGNOSIS — K219 Gastro-esophageal reflux disease without esophagitis: Secondary | ICD-10-CM | POA: Diagnosis not present

## 2018-04-06 DIAGNOSIS — E559 Vitamin D deficiency, unspecified: Secondary | ICD-10-CM | POA: Diagnosis not present

## 2018-04-06 DIAGNOSIS — E781 Pure hyperglyceridemia: Secondary | ICD-10-CM | POA: Diagnosis not present

## 2018-04-06 DIAGNOSIS — D519 Vitamin B12 deficiency anemia, unspecified: Secondary | ICD-10-CM | POA: Diagnosis not present

## 2018-04-06 DIAGNOSIS — I2510 Atherosclerotic heart disease of native coronary artery without angina pectoris: Secondary | ICD-10-CM | POA: Diagnosis not present

## 2018-05-11 DIAGNOSIS — M542 Cervicalgia: Secondary | ICD-10-CM | POA: Diagnosis not present

## 2018-05-11 DIAGNOSIS — M9901 Segmental and somatic dysfunction of cervical region: Secondary | ICD-10-CM | POA: Diagnosis not present

## 2018-05-11 DIAGNOSIS — M5413 Radiculopathy, cervicothoracic region: Secondary | ICD-10-CM | POA: Diagnosis not present

## 2018-05-15 DIAGNOSIS — M9901 Segmental and somatic dysfunction of cervical region: Secondary | ICD-10-CM | POA: Diagnosis not present

## 2018-05-15 DIAGNOSIS — M542 Cervicalgia: Secondary | ICD-10-CM | POA: Diagnosis not present

## 2018-05-15 DIAGNOSIS — M5413 Radiculopathy, cervicothoracic region: Secondary | ICD-10-CM | POA: Diagnosis not present

## 2018-05-16 DIAGNOSIS — M542 Cervicalgia: Secondary | ICD-10-CM | POA: Diagnosis not present

## 2018-05-16 DIAGNOSIS — M5413 Radiculopathy, cervicothoracic region: Secondary | ICD-10-CM | POA: Diagnosis not present

## 2018-05-16 DIAGNOSIS — M9901 Segmental and somatic dysfunction of cervical region: Secondary | ICD-10-CM | POA: Diagnosis not present

## 2018-05-18 DIAGNOSIS — M9901 Segmental and somatic dysfunction of cervical region: Secondary | ICD-10-CM | POA: Diagnosis not present

## 2018-05-18 DIAGNOSIS — M5413 Radiculopathy, cervicothoracic region: Secondary | ICD-10-CM | POA: Diagnosis not present

## 2018-05-18 DIAGNOSIS — M542 Cervicalgia: Secondary | ICD-10-CM | POA: Diagnosis not present

## 2018-05-22 DIAGNOSIS — M542 Cervicalgia: Secondary | ICD-10-CM | POA: Diagnosis not present

## 2018-05-22 DIAGNOSIS — M9901 Segmental and somatic dysfunction of cervical region: Secondary | ICD-10-CM | POA: Diagnosis not present

## 2018-05-22 DIAGNOSIS — M5413 Radiculopathy, cervicothoracic region: Secondary | ICD-10-CM | POA: Diagnosis not present

## 2018-06-05 DIAGNOSIS — M542 Cervicalgia: Secondary | ICD-10-CM | POA: Diagnosis not present

## 2018-06-05 DIAGNOSIS — M5413 Radiculopathy, cervicothoracic region: Secondary | ICD-10-CM | POA: Diagnosis not present

## 2018-06-05 DIAGNOSIS — M9901 Segmental and somatic dysfunction of cervical region: Secondary | ICD-10-CM | POA: Diagnosis not present

## 2018-07-12 DIAGNOSIS — R69 Illness, unspecified: Secondary | ICD-10-CM | POA: Diagnosis not present

## 2018-10-02 DIAGNOSIS — I10 Essential (primary) hypertension: Secondary | ICD-10-CM | POA: Diagnosis not present

## 2018-10-02 DIAGNOSIS — R7303 Prediabetes: Secondary | ICD-10-CM | POA: Diagnosis not present

## 2018-10-02 DIAGNOSIS — D519 Vitamin B12 deficiency anemia, unspecified: Secondary | ICD-10-CM | POA: Diagnosis not present

## 2018-10-02 DIAGNOSIS — E559 Vitamin D deficiency, unspecified: Secondary | ICD-10-CM | POA: Diagnosis not present

## 2018-10-02 DIAGNOSIS — E538 Deficiency of other specified B group vitamins: Secondary | ICD-10-CM | POA: Diagnosis not present

## 2018-10-23 DIAGNOSIS — K219 Gastro-esophageal reflux disease without esophagitis: Secondary | ICD-10-CM | POA: Diagnosis not present

## 2018-10-23 DIAGNOSIS — I10 Essential (primary) hypertension: Secondary | ICD-10-CM | POA: Diagnosis not present

## 2018-10-23 DIAGNOSIS — Z96652 Presence of left artificial knee joint: Secondary | ICD-10-CM | POA: Diagnosis not present

## 2018-10-23 DIAGNOSIS — I2510 Atherosclerotic heart disease of native coronary artery without angina pectoris: Secondary | ICD-10-CM | POA: Diagnosis not present

## 2018-10-23 DIAGNOSIS — E782 Mixed hyperlipidemia: Secondary | ICD-10-CM | POA: Diagnosis not present

## 2018-10-23 DIAGNOSIS — M170 Bilateral primary osteoarthritis of knee: Secondary | ICD-10-CM | POA: Diagnosis not present

## 2018-10-23 DIAGNOSIS — N400 Enlarged prostate without lower urinary tract symptoms: Secondary | ICD-10-CM | POA: Diagnosis not present

## 2018-10-23 DIAGNOSIS — R7303 Prediabetes: Secondary | ICD-10-CM | POA: Diagnosis not present

## 2018-10-23 DIAGNOSIS — I480 Paroxysmal atrial fibrillation: Secondary | ICD-10-CM | POA: Diagnosis not present

## 2019-01-05 DIAGNOSIS — R609 Edema, unspecified: Secondary | ICD-10-CM | POA: Diagnosis not present

## 2019-01-05 DIAGNOSIS — R509 Fever, unspecified: Secondary | ICD-10-CM | POA: Diagnosis not present

## 2019-01-05 DIAGNOSIS — R05 Cough: Secondary | ICD-10-CM | POA: Diagnosis not present

## 2019-01-05 DIAGNOSIS — J019 Acute sinusitis, unspecified: Secondary | ICD-10-CM | POA: Diagnosis not present

## 2019-01-05 DIAGNOSIS — H9209 Otalgia, unspecified ear: Secondary | ICD-10-CM | POA: Diagnosis not present

## 2019-02-05 DIAGNOSIS — I2510 Atherosclerotic heart disease of native coronary artery without angina pectoris: Secondary | ICD-10-CM | POA: Diagnosis not present

## 2019-02-05 DIAGNOSIS — I10 Essential (primary) hypertension: Secondary | ICD-10-CM | POA: Diagnosis not present

## 2019-02-05 DIAGNOSIS — M542 Cervicalgia: Secondary | ICD-10-CM | POA: Diagnosis not present

## 2019-02-05 DIAGNOSIS — E6609 Other obesity due to excess calories: Secondary | ICD-10-CM | POA: Diagnosis not present

## 2019-02-05 DIAGNOSIS — L03115 Cellulitis of right lower limb: Secondary | ICD-10-CM | POA: Diagnosis not present

## 2019-02-05 DIAGNOSIS — Z6836 Body mass index (BMI) 36.0-36.9, adult: Secondary | ICD-10-CM | POA: Diagnosis not present

## 2019-02-05 DIAGNOSIS — I480 Paroxysmal atrial fibrillation: Secondary | ICD-10-CM | POA: Diagnosis not present

## 2019-02-05 DIAGNOSIS — R609 Edema, unspecified: Secondary | ICD-10-CM | POA: Diagnosis not present

## 2019-02-05 DIAGNOSIS — I509 Heart failure, unspecified: Secondary | ICD-10-CM | POA: Diagnosis not present

## 2019-02-08 DIAGNOSIS — E781 Pure hyperglyceridemia: Secondary | ICD-10-CM | POA: Diagnosis not present

## 2019-02-08 DIAGNOSIS — R7301 Impaired fasting glucose: Secondary | ICD-10-CM | POA: Diagnosis not present

## 2019-02-08 DIAGNOSIS — L98499 Non-pressure chronic ulcer of skin of other sites with unspecified severity: Secondary | ICD-10-CM | POA: Diagnosis not present

## 2019-02-08 DIAGNOSIS — M542 Cervicalgia: Secondary | ICD-10-CM | POA: Diagnosis not present

## 2019-02-08 DIAGNOSIS — Z712 Person consulting for explanation of examination or test findings: Secondary | ICD-10-CM | POA: Diagnosis not present

## 2019-02-08 DIAGNOSIS — R609 Edema, unspecified: Secondary | ICD-10-CM | POA: Diagnosis not present

## 2019-02-19 DIAGNOSIS — C44712 Basal cell carcinoma of skin of right lower limb, including hip: Secondary | ICD-10-CM | POA: Diagnosis not present

## 2019-02-20 ENCOUNTER — Emergency Department (HOSPITAL_COMMUNITY)
Admission: EM | Admit: 2019-02-20 | Discharge: 2019-02-20 | Disposition: A | Discharge status: Home / Self Care | Payer: Medicare HMO | Attending: Emergency Medicine | Admitting: Emergency Medicine

## 2019-02-20 ENCOUNTER — Other Ambulatory Visit: Payer: Self-pay

## 2019-02-20 ENCOUNTER — Encounter (HOSPITAL_COMMUNITY): Payer: Self-pay | Admitting: Emergency Medicine

## 2019-02-20 DIAGNOSIS — Z79899 Other long term (current) drug therapy: Secondary | ICD-10-CM | POA: Diagnosis not present

## 2019-02-20 DIAGNOSIS — R58 Hemorrhage, not elsewhere classified: Secondary | ICD-10-CM | POA: Diagnosis not present

## 2019-02-20 DIAGNOSIS — I10 Essential (primary) hypertension: Secondary | ICD-10-CM | POA: Insufficient documentation

## 2019-02-20 DIAGNOSIS — Z96652 Presence of left artificial knee joint: Secondary | ICD-10-CM | POA: Insufficient documentation

## 2019-02-20 DIAGNOSIS — I83891 Varicose veins of right lower extremities with other complications: Secondary | ICD-10-CM

## 2019-02-20 DIAGNOSIS — Z951 Presence of aortocoronary bypass graft: Secondary | ICD-10-CM | POA: Insufficient documentation

## 2019-02-20 DIAGNOSIS — Z7982 Long term (current) use of aspirin: Secondary | ICD-10-CM | POA: Diagnosis not present

## 2019-02-20 DIAGNOSIS — Z87891 Personal history of nicotine dependence: Secondary | ICD-10-CM | POA: Diagnosis not present

## 2019-02-20 DIAGNOSIS — I83892 Varicose veins of left lower extremities with other complications: Secondary | ICD-10-CM | POA: Diagnosis not present

## 2019-02-20 DIAGNOSIS — I2510 Atherosclerotic heart disease of native coronary artery without angina pectoris: Secondary | ICD-10-CM | POA: Insufficient documentation

## 2019-02-20 DIAGNOSIS — L7622 Postprocedural hemorrhage and hematoma of skin and subcutaneous tissue following other procedure: Secondary | ICD-10-CM | POA: Diagnosis present

## 2019-02-20 MED ORDER — LIDOCAINE-EPINEPHRINE (PF) 2 %-1:200000 IJ SOLN
INTRAMUSCULAR | Status: AC
  Administered 2019-02-20: 21:00:00
  Filled 2019-02-20: qty 10

## 2019-02-20 NOTE — Discharge Instructions (Addendum)
Your bleeding wound area has been repaired with 3 dissolvable sutures and also a special pad to control bleeding has been applied.  Please keep your leg elevated as much as possible.  Leave the dressing in place over the next 3days.  Please do not get the dressing wet.  Please return to the emergency department if the bleeding returns before this 3-day.  It is over.  Keep your leg elevated above your waist when you are sitting and above your heart when you are lying down.  Please limit your getting up and down to essential movements such as going to the bathroom which you are eating area over the next 24 hours.

## 2019-02-20 NOTE — ED Provider Notes (Addendum)
[facility] EMERGENCY DEPARTMENT Provider Note   CSN: [id number] Arrival date & time: 02/20/19  2011     History   Chief Complaint Chief Complaint  Patient presents with   Post-op bleeding    HPI Darius Grimes is a 73 y.o. male Darius Grimes.     .  Patient is a 74 year old male who presents to the emergency department with bleeding from a postoperative site.  The patient states that on yesterday June 22 he had a quarter size biopsy of his right lower extremity.  He says that it had been doing well all day with the current dressing.  He was mowing the grass on a riding mower and noticed that his sock was wet and when he did he noticed a lot of blood.  It took a while to get the blood to stop and so his family strongly encouraged him to come to the emergency department the patient came to the ED by EMS because of the uncontrolled bleeding.  EMS reports they had to apply a patient pressure dressing to the area x2 and still had bleeding through the dressing.  The patient denies being on any anticoagulation medications.  He has no history of bleeding disorders.  He says he does not remember bumping R or injuring the area.  He does admit that he was walking around in the house some and then out to the ER to mow the lawn and is riding mower.     Past Medical History:  Diagnosis Date   Acid reflux    Allergic rhinitis    Arthritis    Coronary artery disease    Dysrhythmia    Episodic atrial fibrillation (HCC)    GERD (gastroesophageal reflux disease)    Heart palpitations    History of kidney stones    Hyperlipidemia    Hypertension    Kidney stones    Neck pain    Recurrent nephrolithiasis    Skin lesion of back     Patient Active Problem List   Diagnosis Date Noted   S/P total knee arthroplasty 07/04/2017   Vitamin D deficiency 10/22/2016   B12 deficiency 10/22/2016   Wears hearing aid 08/19/2015   Bronchitis with bronchospasm 07/18/2015   Allergic  rhinitis 03/19/2015   AF (paroxysmal atrial fibrillation) ([place]) 03/19/2015   Acid reflux 03/19/2015   Dyslipidemia 03/19/2015   Hypertension goal BP (blood pressure) < 140/90 03/19/2015   Cervical radiculitis 03/19/2015   Calculus of kidney 03/19/2015   S/P coronary artery bypass graft x 3 03/06/2015    Past Surgical History:  Procedure Laterality Date   CARPAL TUNNEL RELEASE Left    CORONARY ARTERY BYPASS GRAFT     HYDROCELE EXCISION / REPAIR     KNEE ARTHROPLASTY Left 07/04/2017   Procedure: COMPUTER ASSISTED TOTAL KNEE ARTHROPLASTY;  Surgeon: Dereck Leep, MD;  Location: [facility] ORS;  Service: Orthopedics;  Laterality: Left;   PROSTATE SURGERY     THROAT SURGERY     URETERAL STENT PLACEMENT     at the [facility]   Vocal cord poylp removal          Home Medications    Prior to Admission medications   Medication Sig Start Date End Date Taking? Authorizing Provider  amLODipine (NORVASC) 5 MG tablet Take 5 mg by mouth 2 (two) times daily.    [provider]  aspirin 81 MG tablet Take 81 mg by mouth daily.     [provider]  cholecalciferol (  VITAMIN D) 1000 units tablet Take 5,000 Units by mouth 2 (two) times daily.     [provider]  docusate sodium (COLACE) 50 MG capsule Take 50 mg by mouth 2 (two) times daily.    [provider]  enoxaparin (LOVENOX) 40 MG/0.4ML injection Inject 0.4 mLs (40 mg total) daily into the skin. 07/07/17   Watt Climes, PA  finasteride (PROSCAR) 5 MG tablet Take 5 mg by mouth daily.    [provider]  fluticasone (FLONASE) 50 MCG/ACT nasal spray Place 2 sprays into both nostrils at bedtime.    [provider]  furosemide (LASIX) 20 MG tablet Take 20 mg by mouth 2 (two) times daily.    [provider]  lisinopril (PRINIVIL,ZESTRIL) 40 MG tablet Take 40 mg by mouth daily.    [provider]  metoprolol tartrate (LOPRESSOR) 12.5 mg TABS tablet Take 12.5 mg by mouth 2 (two)  times daily.    [provider]  mirabegron ER (MYRBETRIQ) 25 MG TB24 tablet Take 25 mg by mouth daily.    [provider]  Multiple Vitamin (MULTIVITAMIN WITH MINERALS) TABS tablet Take 1 tablet by mouth daily.    [provider]  Omega-3 Fatty Acids (FISH OIL) 1000 MG CAPS Take 1 capsule by mouth daily.    [provider]  oxyCODONE (OXY IR/ROXICODONE) 5 MG immediate release tablet Take 1 tablet (5 mg total) every 3 (three) hours as needed by mouth for moderate pain ((score 4 to 6)). 07/05/17   Watt Climes, PA  potassium chloride (KLOR-CON) 8 MEQ tablet Take 16 mEq by mouth 2 (two) times daily.     [provider]  pravastatin (PRAVACHOL) 40 MG tablet Take 20 mg by mouth daily. Takes irregular    [provider]  ranitidine (ZANTAC) 150 MG tablet Take 150 mg by mouth 2 (two) times daily.     [provider]  rosuvastatin (CRESTOR) 10 MG tablet Take 5 mg by mouth daily.    [provider]  tamsulosin (FLOMAX) 0.4 MG CAPS capsule Take 0.4 mg by mouth daily.     [provider]  traMADol (ULTRAM) 50 MG tablet Take 50-100 mg by mouth every 6 (six) hours as needed for moderate pain or severe pain.    [provider]  Turmeric 500 MG CAPS Take 1 capsule by mouth once.    [provider]  vitamin B-12 (CYANOCOBALAMIN) 1000 MCG tablet Take 1,000 mcg by mouth daily.    [provider]    Family History Family History  Problem Relation Age of Onset   CVA Mother    Hypotension Mother    CAD Father    Heart attack Father    Hypertension Sister    Hypertension Brother    Obesity Brother     Social History Social History   Tobacco Use   Smoking status: Former Smoker    Packs/day: 1.00    Years: 7.00    Pack years: 7.00    Types: Cigarettes    Quit date: 08/31/1983    Years since quitting: 35.4   Smokeless tobacco: Never Used   Tobacco comment: smoking cessation materials not  required  Substance Use Topics   Alcohol use: No    Alcohol/week: 0.0 standard drinks   Drug use: No     Allergies   Patient has no known allergies.   Review of Systems Review of Systems  Constitutional: Negative for activity change.  All ROS Neg except as noted in HPI  HENT: Negative for nosebleeds.   Eyes: Negative for photophobia and discharge.  Respiratory: Negative for cough, shortness of breath and wheezing.   Cardiovascular: Negative for chest pain and palpitations.  Gastrointestinal: Negative for abdominal pain and blood in stool.  Genitourinary: Negative for dysuria, frequency and hematuria.  Musculoskeletal: Negative for arthralgias, back pain and neck pain.  Skin: Positive for wound.       Wound to right lower extremity  Neurological: Negative for dizziness, seizures and speech difficulty.  Psychiatric/Behavioral: Negative for confusion and hallucinations.     Physical Exam Updated Vital Signs BP 136/86    Pulse 90    Temp 98.1 F (36.7 C) (Oral)    Resp 16    SpO2 96%   Physical Exam Vitals signs and nursing note reviewed.  Constitutional:      Appearance: He is well-developed. He is not toxic-appearing.  HENT:     Head: Normocephalic.     Right Ear: Tympanic membrane and external ear normal.     Left Ear: Tympanic membrane and external ear normal.  Eyes:     General: Lids are normal.     Pupils: Pupils are equal, round, and reactive to light.  Neck:     Musculoskeletal: Normal range of motion and neck supple.     Vascular: No carotid bruit.  Cardiovascular:     Rate and Rhythm: Normal rate and regular rhythm.     Pulses: Normal pulses.     Heart sounds: Normal heart sounds.  Pulmonary:     Effort: No respiratory distress.     Breath sounds: Normal breath sounds.  Abdominal:     General: Bowel sounds are normal.     Palpations: Abdomen is soft.     Tenderness: There is no abdominal tenderness. There is no guarding.  Musculoskeletal:  Normal range of motion.     Comments: There is a quarter sized wound to the anterior right lower extremity with scabbing present.  At approximately the 8 to 9 o'clock position there is a bleeding area.  There are no red streaks appreciated.  The area is not hot.  There is no visible foreign body.  Lymphadenopathy:     Head:     Right side of head: No submandibular adenopathy.     Left side of head: No submandibular adenopathy.     Cervical: No cervical adenopathy.  Skin:    General: Skin is warm and dry.  Neurological:     Mental Status: He is alert and oriented to person, place, and time.     Cranial Nerves: No cranial nerve deficit.     Sensory: No sensory deficit.  Psychiatric:        Speech: Speech normal.      ED Treatments / Results  Labs (all labs ordered are listed, but only abnormal results are displayed) Labs Reviewed - No data to display  EKG    Radiology No results found.  Procedures Procedures (including critical care time) POST OP BLEEDING - RIGHT LOWER EXTREMITY. Patient had a quarter size area removed and biopsied from the right lower extremity on yesterday.  He now has bleeding in the 8 to 9 o'clock position on this wound.  Pressure dressing applied without success.  The area was infiltrated with 2% lidocaine with epinephrine.  Using sterile technique, 3 sutures of 3-0 Vicryl-Rapide were used to attempt to ligate the area. Wound measures 2.2cm. Hemostasis was achieved while the  patient was lying down, but when he stood up to go to the bathroom the bleeding restarted.  A quick-clot pad and pressure dressing were applied.  Patient was observed for nearly an hour without return of the bleeding.  The patient got up and walked around in the room and there was no bleeding through the dressing.  I have asked the patient to keep his foot elevated.  To be up and about over the next 24 hours only if he had to go to the bathroom where his eating area or his sitting area.   I have asked him to leave the dressing in place over the next 3 days.  Have asked him to return to the emergency department immediately if any recurrent return of bleeding, changes in his condition, problems or concerns.   Medications Ordered in ED Medications  lidocaine-EPINEPHrine (XYLOCAINE W/EPI) 2 %-1:200000 (PF) injection (  Given 02/20/19 2057)     Initial Impression / Assessment and Plan / ED Course  I have reviewed the triage vital signs and the nursing notes.  Pertinent labs & imaging results that were available during my care of the patient were reviewed by me and considered in my medical decision making (see chart for details).          Final Clinical Impressions(s) / ED Diagnoses MDM  The patient had a biopsy of the anterior right lower extremity on yesterday, June 22.  The patient has been walking around today and was attempting to mow his grass with a riding mower and started having bleeding.  Pressure dressing to the area was applied without success.  Ligating suture was applied with only partial success.  Ligating suture along with quick clot pad and pressure dressing were successful.  The patient has been given instructions to keep his leg elevated above his waist when sitting and above his heart when lying down over the next 24 hours.  He has been advised to leave the dressing in place over the next 3 days.  He is to return to the emergency department immediately if any changes in his condition, worsening of his bleeding, problems, or concerns.   Final diagnoses:  Bleeding from varicose veins of right lower extremity    ED Discharge Orders    None       Lily Kocher, Hershal Coria 02/21/19 1641    Milton Ferguson, MD 02/27/19 1247    Lily Kocher, PA-C 03/05/19 1645    Milton Ferguson, MD 03/07/19 [phone]

## 2019-02-20 NOTE — ED Triage Notes (Signed)
Pt had skin lesion biopsied yesterday on RLE and was mowing grass today when area started bleeding uncontrollably. Brought in by [facility] with uncontrolled bleeding to site. Pressure dressing applied x 2 by EMS and site still bleeding through dressing.

## 2019-03-08 DIAGNOSIS — M542 Cervicalgia: Secondary | ICD-10-CM | POA: Diagnosis not present

## 2019-03-08 DIAGNOSIS — L98499 Non-pressure chronic ulcer of skin of other sites with unspecified severity: Secondary | ICD-10-CM | POA: Diagnosis not present

## 2019-03-08 DIAGNOSIS — R609 Edema, unspecified: Secondary | ICD-10-CM | POA: Diagnosis not present

## 2019-03-09 ENCOUNTER — Telehealth (HOSPITAL_COMMUNITY): Payer: Self-pay | Admitting: General Practice

## 2019-03-09 NOTE — Telephone Encounter (Signed)
03/09/19  I left a message to let patient know that I was needing to schedule him for wound eval.  I offered the 7/16 date and time and asked that he call back to confirm.

## 2019-03-12 ENCOUNTER — Telehealth (HOSPITAL_COMMUNITY): Payer: Self-pay | Admitting: General Practice

## 2019-03-12 DIAGNOSIS — Z08 Encounter for follow-up examination after completed treatment for malignant neoplasm: Secondary | ICD-10-CM | POA: Diagnosis not present

## 2019-03-12 DIAGNOSIS — Z85828 Personal history of other malignant neoplasm of skin: Secondary | ICD-10-CM | POA: Diagnosis not present

## 2019-03-12 NOTE — Telephone Encounter (Signed)
03/12/19  Left a 2nd message to confirm the eval appt on 7/16.  So far haven't heard back from patient.  There is a wife's phone number will call that and see if I can get in touch with anyone.

## 2019-03-12 NOTE — Telephone Encounter (Signed)
03/12/19  pt called to cx said that he went to his Dermatologist this morning and he looked at the wound and doesn't think he needs to come here for treatment.  pt asked that all appts be cancelled

## 2019-03-12 NOTE — Telephone Encounter (Signed)
03/12/19  I called and spoke with his wife and she said that he did get our message and would be at the appt on 7/16

## 2019-03-15 ENCOUNTER — Encounter (HOSPITAL_COMMUNITY): Payer: Self-pay

## 2019-03-15 ENCOUNTER — Ambulatory Visit (HOSPITAL_COMMUNITY): Payer: Medicare HMO

## 2019-03-27 ENCOUNTER — Ambulatory Visit (HOSPITAL_COMMUNITY): Payer: Medicare HMO | Admitting: Physical Therapy

## 2019-03-29 ENCOUNTER — Ambulatory Visit (HOSPITAL_COMMUNITY): Payer: Medicare HMO

## 2019-04-02 DIAGNOSIS — L03115 Cellulitis of right lower limb: Secondary | ICD-10-CM | POA: Diagnosis not present

## 2019-04-02 DIAGNOSIS — S81801D Unspecified open wound, right lower leg, subsequent encounter: Secondary | ICD-10-CM | POA: Diagnosis not present

## 2019-04-02 DIAGNOSIS — R7303 Prediabetes: Secondary | ICD-10-CM | POA: Diagnosis not present

## 2019-04-02 DIAGNOSIS — I10 Essential (primary) hypertension: Secondary | ICD-10-CM | POA: Diagnosis not present

## 2019-04-02 DIAGNOSIS — E538 Deficiency of other specified B group vitamins: Secondary | ICD-10-CM | POA: Diagnosis not present

## 2019-04-02 DIAGNOSIS — R609 Edema, unspecified: Secondary | ICD-10-CM | POA: Diagnosis not present

## 2019-04-02 DIAGNOSIS — E559 Vitamin D deficiency, unspecified: Secondary | ICD-10-CM | POA: Diagnosis not present

## 2019-04-02 DIAGNOSIS — D519 Vitamin B12 deficiency anemia, unspecified: Secondary | ICD-10-CM | POA: Diagnosis not present

## 2019-04-02 DIAGNOSIS — I2510 Atherosclerotic heart disease of native coronary artery without angina pectoris: Secondary | ICD-10-CM | POA: Diagnosis not present

## 2019-04-02 DIAGNOSIS — E781 Pure hyperglyceridemia: Secondary | ICD-10-CM | POA: Diagnosis not present

## 2019-04-03 ENCOUNTER — Ambulatory Visit (HOSPITAL_COMMUNITY): Payer: Medicare HMO | Admitting: Physical Therapy

## 2019-04-05 ENCOUNTER — Ambulatory Visit (HOSPITAL_COMMUNITY): Payer: Medicare HMO

## 2019-04-06 DIAGNOSIS — S81801D Unspecified open wound, right lower leg, subsequent encounter: Secondary | ICD-10-CM | POA: Diagnosis not present

## 2019-04-09 ENCOUNTER — Ambulatory Visit (HOSPITAL_COMMUNITY): Payer: Medicare HMO

## 2019-04-11 DIAGNOSIS — S81801D Unspecified open wound, right lower leg, subsequent encounter: Secondary | ICD-10-CM | POA: Diagnosis not present

## 2019-04-12 ENCOUNTER — Ambulatory Visit (HOSPITAL_COMMUNITY): Payer: Medicare HMO

## 2019-04-17 DIAGNOSIS — R7303 Prediabetes: Secondary | ICD-10-CM | POA: Diagnosis not present

## 2019-04-17 DIAGNOSIS — E559 Vitamin D deficiency, unspecified: Secondary | ICD-10-CM | POA: Diagnosis not present

## 2019-04-17 DIAGNOSIS — D519 Vitamin B12 deficiency anemia, unspecified: Secondary | ICD-10-CM | POA: Diagnosis not present

## 2019-04-17 DIAGNOSIS — E538 Deficiency of other specified B group vitamins: Secondary | ICD-10-CM | POA: Diagnosis not present

## 2019-04-17 DIAGNOSIS — E781 Pure hyperglyceridemia: Secondary | ICD-10-CM | POA: Diagnosis not present

## 2019-04-17 DIAGNOSIS — I10 Essential (primary) hypertension: Secondary | ICD-10-CM | POA: Diagnosis not present

## 2019-04-17 DIAGNOSIS — R609 Edema, unspecified: Secondary | ICD-10-CM | POA: Diagnosis not present

## 2019-04-17 DIAGNOSIS — I2510 Atherosclerotic heart disease of native coronary artery without angina pectoris: Secondary | ICD-10-CM | POA: Diagnosis not present

## 2019-04-17 DIAGNOSIS — L03115 Cellulitis of right lower limb: Secondary | ICD-10-CM | POA: Diagnosis not present

## 2019-04-17 DIAGNOSIS — S81801D Unspecified open wound, right lower leg, subsequent encounter: Secondary | ICD-10-CM | POA: Diagnosis not present

## 2019-04-24 DIAGNOSIS — R7303 Prediabetes: Secondary | ICD-10-CM | POA: Diagnosis not present

## 2019-04-24 DIAGNOSIS — I10 Essential (primary) hypertension: Secondary | ICD-10-CM | POA: Diagnosis not present

## 2019-04-24 DIAGNOSIS — E781 Pure hyperglyceridemia: Secondary | ICD-10-CM | POA: Diagnosis not present

## 2019-04-24 DIAGNOSIS — E559 Vitamin D deficiency, unspecified: Secondary | ICD-10-CM | POA: Diagnosis not present

## 2019-04-24 DIAGNOSIS — S81801D Unspecified open wound, right lower leg, subsequent encounter: Secondary | ICD-10-CM | POA: Diagnosis not present

## 2019-04-24 DIAGNOSIS — E538 Deficiency of other specified B group vitamins: Secondary | ICD-10-CM | POA: Diagnosis not present

## 2019-04-24 DIAGNOSIS — D519 Vitamin B12 deficiency anemia, unspecified: Secondary | ICD-10-CM | POA: Diagnosis not present

## 2019-04-24 DIAGNOSIS — E782 Mixed hyperlipidemia: Secondary | ICD-10-CM | POA: Diagnosis not present

## 2019-04-30 DIAGNOSIS — K219 Gastro-esophageal reflux disease without esophagitis: Secondary | ICD-10-CM | POA: Diagnosis not present

## 2019-04-30 DIAGNOSIS — I10 Essential (primary) hypertension: Secondary | ICD-10-CM | POA: Diagnosis not present

## 2019-04-30 DIAGNOSIS — Z96652 Presence of left artificial knee joint: Secondary | ICD-10-CM | POA: Diagnosis not present

## 2019-04-30 DIAGNOSIS — I2510 Atherosclerotic heart disease of native coronary artery without angina pectoris: Secondary | ICD-10-CM | POA: Diagnosis not present

## 2019-04-30 DIAGNOSIS — I480 Paroxysmal atrial fibrillation: Secondary | ICD-10-CM | POA: Diagnosis not present

## 2019-04-30 DIAGNOSIS — E782 Mixed hyperlipidemia: Secondary | ICD-10-CM | POA: Diagnosis not present

## 2019-04-30 DIAGNOSIS — R7303 Prediabetes: Secondary | ICD-10-CM | POA: Diagnosis not present

## 2019-04-30 DIAGNOSIS — N400 Enlarged prostate without lower urinary tract symptoms: Secondary | ICD-10-CM | POA: Diagnosis not present

## 2019-04-30 DIAGNOSIS — M170 Bilateral primary osteoarthritis of knee: Secondary | ICD-10-CM | POA: Diagnosis not present

## 2019-05-21 DIAGNOSIS — S81801D Unspecified open wound, right lower leg, subsequent encounter: Secondary | ICD-10-CM | POA: Diagnosis not present

## 2019-05-28 DIAGNOSIS — S81801D Unspecified open wound, right lower leg, subsequent encounter: Secondary | ICD-10-CM | POA: Diagnosis not present

## 2019-06-27 DIAGNOSIS — R69 Illness, unspecified: Secondary | ICD-10-CM | POA: Diagnosis not present

## 2019-08-15 ENCOUNTER — Other Ambulatory Visit: Payer: Self-pay

## 2019-08-15 ENCOUNTER — Ambulatory Visit: Payer: Medicare HMO | Attending: Internal Medicine

## 2019-08-15 DIAGNOSIS — Z20822 Contact with and (suspected) exposure to covid-19: Secondary | ICD-10-CM

## 2019-08-15 DIAGNOSIS — Z20828 Contact with and (suspected) exposure to other viral communicable diseases: Secondary | ICD-10-CM | POA: Diagnosis not present

## 2019-08-17 LAB — NOVEL CORONAVIRUS, NAA: SARS-CoV-2, NAA: NOT DETECTED

## 2019-08-20 ENCOUNTER — Telehealth: Payer: Self-pay | Admitting: General Practice

## 2019-08-20 NOTE — Telephone Encounter (Signed)
Gave patient covid test results °Patient understood  °

## 2019-09-10 DIAGNOSIS — R7303 Prediabetes: Secondary | ICD-10-CM | POA: Diagnosis not present

## 2019-09-10 DIAGNOSIS — D519 Vitamin B12 deficiency anemia, unspecified: Secondary | ICD-10-CM | POA: Diagnosis not present

## 2019-09-10 DIAGNOSIS — R7301 Impaired fasting glucose: Secondary | ICD-10-CM | POA: Diagnosis not present

## 2019-09-10 DIAGNOSIS — I10 Essential (primary) hypertension: Secondary | ICD-10-CM | POA: Diagnosis not present

## 2019-09-10 DIAGNOSIS — E782 Mixed hyperlipidemia: Secondary | ICD-10-CM | POA: Diagnosis not present

## 2019-09-10 DIAGNOSIS — E538 Deficiency of other specified B group vitamins: Secondary | ICD-10-CM | POA: Diagnosis not present

## 2019-09-18 DIAGNOSIS — R7303 Prediabetes: Secondary | ICD-10-CM | POA: Diagnosis not present

## 2019-09-18 DIAGNOSIS — E782 Mixed hyperlipidemia: Secondary | ICD-10-CM | POA: Diagnosis not present

## 2019-09-18 DIAGNOSIS — N400 Enlarged prostate without lower urinary tract symptoms: Secondary | ICD-10-CM | POA: Diagnosis not present

## 2019-09-18 DIAGNOSIS — Z96652 Presence of left artificial knee joint: Secondary | ICD-10-CM | POA: Diagnosis not present

## 2019-09-18 DIAGNOSIS — I10 Essential (primary) hypertension: Secondary | ICD-10-CM | POA: Diagnosis not present

## 2019-09-18 DIAGNOSIS — I480 Paroxysmal atrial fibrillation: Secondary | ICD-10-CM | POA: Diagnosis not present

## 2019-09-18 DIAGNOSIS — K219 Gastro-esophageal reflux disease without esophagitis: Secondary | ICD-10-CM | POA: Diagnosis not present

## 2019-09-18 DIAGNOSIS — I2510 Atherosclerotic heart disease of native coronary artery without angina pectoris: Secondary | ICD-10-CM | POA: Diagnosis not present

## 2019-09-18 DIAGNOSIS — M170 Bilateral primary osteoarthritis of knee: Secondary | ICD-10-CM | POA: Diagnosis not present

## 2019-11-28 DIAGNOSIS — N3001 Acute cystitis with hematuria: Secondary | ICD-10-CM | POA: Diagnosis not present

## 2019-11-28 DIAGNOSIS — R300 Dysuria: Secondary | ICD-10-CM | POA: Diagnosis not present

## 2019-12-20 DIAGNOSIS — H43811 Vitreous degeneration, right eye: Secondary | ICD-10-CM | POA: Diagnosis not present

## 2019-12-20 DIAGNOSIS — H25813 Combined forms of age-related cataract, bilateral: Secondary | ICD-10-CM | POA: Diagnosis not present

## 2020-01-03 DIAGNOSIS — E781 Pure hyperglyceridemia: Secondary | ICD-10-CM | POA: Diagnosis not present

## 2020-01-03 DIAGNOSIS — E559 Vitamin D deficiency, unspecified: Secondary | ICD-10-CM | POA: Diagnosis not present

## 2020-01-03 DIAGNOSIS — R7301 Impaired fasting glucose: Secondary | ICD-10-CM | POA: Diagnosis not present

## 2020-01-03 DIAGNOSIS — E6609 Other obesity due to excess calories: Secondary | ICD-10-CM | POA: Diagnosis not present

## 2020-01-03 DIAGNOSIS — I2510 Atherosclerotic heart disease of native coronary artery without angina pectoris: Secondary | ICD-10-CM | POA: Diagnosis not present

## 2020-01-03 DIAGNOSIS — E782 Mixed hyperlipidemia: Secondary | ICD-10-CM | POA: Diagnosis not present

## 2020-01-03 DIAGNOSIS — E538 Deficiency of other specified B group vitamins: Secondary | ICD-10-CM | POA: Diagnosis not present

## 2020-01-03 DIAGNOSIS — H9209 Otalgia, unspecified ear: Secondary | ICD-10-CM | POA: Diagnosis not present

## 2020-01-03 DIAGNOSIS — R7303 Prediabetes: Secondary | ICD-10-CM | POA: Diagnosis not present

## 2020-01-03 DIAGNOSIS — D519 Vitamin B12 deficiency anemia, unspecified: Secondary | ICD-10-CM | POA: Diagnosis not present

## 2020-01-03 DIAGNOSIS — I10 Essential (primary) hypertension: Secondary | ICD-10-CM | POA: Diagnosis not present

## 2020-01-08 DIAGNOSIS — K219 Gastro-esophageal reflux disease without esophagitis: Secondary | ICD-10-CM | POA: Diagnosis not present

## 2020-01-08 DIAGNOSIS — I10 Essential (primary) hypertension: Secondary | ICD-10-CM | POA: Diagnosis not present

## 2020-01-08 DIAGNOSIS — I480 Paroxysmal atrial fibrillation: Secondary | ICD-10-CM | POA: Diagnosis not present

## 2020-01-08 DIAGNOSIS — M170 Bilateral primary osteoarthritis of knee: Secondary | ICD-10-CM | POA: Diagnosis not present

## 2020-01-08 DIAGNOSIS — E782 Mixed hyperlipidemia: Secondary | ICD-10-CM | POA: Diagnosis not present

## 2020-01-08 DIAGNOSIS — R7303 Prediabetes: Secondary | ICD-10-CM | POA: Diagnosis not present

## 2020-01-08 DIAGNOSIS — Z0001 Encounter for general adult medical examination with abnormal findings: Secondary | ICD-10-CM | POA: Diagnosis not present

## 2020-01-08 DIAGNOSIS — N400 Enlarged prostate without lower urinary tract symptoms: Secondary | ICD-10-CM | POA: Diagnosis not present

## 2020-01-08 DIAGNOSIS — Z96652 Presence of left artificial knee joint: Secondary | ICD-10-CM | POA: Diagnosis not present

## 2020-01-08 DIAGNOSIS — I2510 Atherosclerotic heart disease of native coronary artery without angina pectoris: Secondary | ICD-10-CM | POA: Diagnosis not present

## 2020-01-11 ENCOUNTER — Encounter: Payer: Self-pay | Admitting: *Deleted

## 2020-01-17 NOTE — H&P (Signed)
Surgical History & Physical  Patient Name: Darius Grimes DOB: 1945-09-28  Surgery: Cataract extraction with intraocular lens implant phacoemulsification; Left Eye  Surgeon: Baruch Goldmann MD Surgery Date:  02/01/2020 Pre-Op Date:  01/16/2020  HPI: A 34 Yr. old male patient -referred by Dr. Alecia Lemming 1. 1. The patient complains of difficulty when driving, which began many years ago. Both eyes are affected. The episode is constant and gradual. The condition's severity decreased since last visit. Symptoms occur when the patient is driving. Patient feels OS worse overall c/w OD. No glare but does notice halos with street lights. VA has gradually decreased for years but distance and near seem much worse in the last few months. This is negatively affecting the patient's quality of life. Went to Dr. Alecia Lemming and advised glasses would not help. -tearing OD x years-intermittent. No discharge, redness, or pain/ache. No tx.  Medical History: Cataracts Acid reflux BPH with lower urinary tract symptoms... Arthritis Heart Problem High Blood Pressure LDL  Review of Systems Ear, Nose, Mouth & Throat hearing loss Musculoskeletal Joint Ache, Stiffness All recorded systems are negative except as noted above.  Social   Never smoked   Medication Fluticasone, Furosemide, Ibuprofen, Lisinopril, Metoprolol, Potassium chloride, Ranitidine, Tamsulosin, Aspirin, Loratadine, Glucosamine Sulfate, Multivitamin, Amlodipine Besylate, Diclofenac, Finesteride,   Sx/Procedures  None  Drug Allergies   NKDA  History & Physical: Heent:  Cataract, Left eye NECK: supple without bruits LUNGS: lungs clear to auscultation CV: regular rate and rhythm Abdomen: soft and non-tender  Impression & Plan: Assessment: 1.  COMBINED FORMS AGE RELATED CATARACT; Both Eyes (H25.813) 2.  VITREOUS DETACHMENT PVD; Right Eye [phone])  Plan: 1.  Cataract accounts for the patient's decreased vision. This visual impairment is not  correctable with a tolerable change in glasses or contact lenses. Cataract surgery with an implantation of a new lens should significantly improve the visual and functional status of the patient. Discussed all risks, benefits, alternatives, and potential complications. Discussed the procedures and recovery. Patient desires to have surgery. A-scan ordered and performed today for intra-ocular lens calculations. The surgery will be performed in order to improve vision for driving, reading, and for eye examinations. Recommend phacoemulsification with intra-ocular lens. Left Eye worse - first. Eyhance - DIB00. Dilates poorly - shugacaine by protocol. Omidira. Malyugin in room. Flomax. 2.  Old Asymptomatic. RD precautions given. Patient to call with increase in flashing lights/floaters/dark curtain. Symptomatic.

## 2020-01-23 NOTE — Patient Instructions (Signed)
Darius Grimes  01/23/2020     @PREFPERIOPPHARMACY @   Your procedure is scheduled on  02/01/2020 .  Report to [facility] at  1030  A.M.  Call this number if you have problems the morning of surgery:  [phone]   Remember:  Do not eat or drink after midnight.                       Take these medicines the morning of surgery with A SIP OF WATER  Proscar, mobic(if needed), metoprolol, flomax.    Do not wear jewelry, make-up or nail polish.  Do not wear lotions, powders, or perfumes. Please wear deodorant and brush your teeth.   Do not shave 48 hours prior to surgery.  Men may shave face and neck.  Do not bring valuables to the hospital.  [facility] is not responsible for any belongings or valuables.  Contacts, dentures or bridgework may not be worn into surgery.  Leave your suitcase in the car.  After surgery it may be brought to your room.  For patients admitted to the hospital, discharge time will be determined by your treatment team.  Patients discharged the day of surgery will not be allowed to drive home.   Name and phone number of your driver:   family Special instructions:  DO NOT smoke the morning of your procedure.  Please read over the following fact sheets that you were given. Anesthesia Post-op Instructions and Care and Recovery After Surgery       Cataract Surgery, Care After This sheet gives you information about how to care for yourself after your procedure. Your health care provider may also give you more specific instructions. If you have problems or questions, contact your health care provider. What can I expect after the procedure? After the procedure, it is common to have:  Itching.  Discomfort.  Fluid discharge.  Sensitivity to light and to touch.  Bruising in or around the eye.  Mild blurred vision. Follow these instructions at home: Eye care   Do not touch or rub your eyes.  Protect your eyes as told by your health care  provider. You may be told to wear a protective eye shield or sunglasses.  Do not put a contact lens into the affected eye or eyes until your health care provider approves.  Keep the area around your eye clean and dry: ? Avoid swimming. ? Do not allow water to hit you directly in the face while showering. ? Keep soap and shampoo out of your eyes.  Check your eye every day for signs of infection. Watch for: ? Redness, swelling, or pain. ? Fluid, blood, or pus. ? Warmth. ? A bad smell. ? Vision that is getting worse. ? Sensitivity that is getting worse. Activity  Do not drive for 24 hours if you were given a sedative during your procedure.  Avoid strenuous activities, such as playing contact sports, for as long as told by your health care provider.  Do not drive or use heavy machinery until your health care provider approves.  Do not bend or lift heavy objects. Bending increases pressure in the eye. You can walk, climb stairs, and do light household chores.  Ask your health care provider when you can return to work. If you work in a dusty environment, you may be advised to wear protective eyewear for a period of time. General instructions  Take or apply over-the-counter and  prescription medicines only as told by your health care provider. This includes eye drops.  Keep all follow-up visits as told by your health care provider. This is important. Contact a health care provider if:  You have increased bruising around your eye.  You have pain that is not helped with medicine.  You have a fever.  You have redness, swelling, or pain in your eye.  You have fluid, blood, or pus coming from your incision.  Your vision gets worse.  Your sensitivity to light gets worse. Get help right away if:  You have sudden loss of vision.  You see flashes of light or spots (floaters).  You have severe eye pain.  You develop nausea or vomiting. Summary  After your procedure, it is  common to have itching, discomfort, bruising, fluid discharge, or sensitivity to light.  Follow instructions from your health care provider about caring for your eye after the procedure.  Do not rub your eye after the procedure. You may need to wear eye protection or sunglasses. Do not wear contact lenses. Keep the area around your eye clean and dry.  Avoid activities that require a lot of effort. These include playing sports and lifting heavy objects.  Contact a health care provider if you have increased bruising, pain that does not go away, or a fever. Get help right away if you suddenly lose your vision, see flashes of light or spots, or have severe pain in the eye. This information is not intended to replace advice given to you by your health care provider. Make sure you discuss any questions you have with your health care provider. Document Revised: 06/12/2019 Document Reviewed: 02/13/2018 Elsevier Patient Education  2020 [place] After These instructions provide you with information about caring for yourself after your procedure. Your health care provider may also give you more specific instructions. Your treatment has been planned according to current medical practices, but problems sometimes occur. Call your health care provider if you have any problems or questions after your procedure. What can I expect after the procedure? After your procedure, you may:  Feel sleepy for several hours.  Feel clumsy and have poor balance for several hours.  Feel forgetful about what happened after the procedure.  Have poor judgment for several hours.  Feel nauseous or vomit.  Have a sore throat if you had a breathing tube during the procedure. Follow these instructions at home: For at least 24 hours after the procedure:      Have a responsible adult stay with you. It is important to have someone help care for you until you are awake and alert.  Rest  as needed.  Do not: ? Participate in activities in which you could fall or become injured. ? Drive. ? Use heavy machinery. ? Drink alcohol. ? Take sleeping pills or medicines that cause drowsiness. ? Make important decisions or sign legal documents. ? Take care of children on your own. Eating and drinking  Follow the diet that is recommended by your health care provider.  If you vomit, drink water, juice, or soup when you can drink without vomiting.  Make sure you have little or no nausea before eating solid foods. General instructions  Take over-the-counter and prescription medicines only as told by your health care provider.  If you have sleep apnea, surgery and certain medicines can increase your risk for breathing problems. Follow instructions from your health care provider about wearing your sleep device: ? Anytime  you are sleeping, including during daytime naps. ? While taking prescription pain medicines, sleeping medicines, or medicines that make you drowsy.  If you smoke, do not smoke without supervision.  Keep all follow-up visits as told by your health care provider. This is important. Contact a health care provider if:  You keep feeling nauseous or you keep vomiting.  You feel light-headed.  You develop a rash.  You have a fever. Get help right away if:  You have trouble breathing. Summary  For several hours after your procedure, you may feel sleepy and have poor judgment.  Have a responsible adult stay with you for at least 24 hours or until you are awake and alert. This information is not intended to replace advice given to you by your health care provider. Make sure you discuss any questions you have with your health care provider. Document Revised: 11/14/2017 Document Reviewed: 12/07/2015 Elsevier Patient Education  [place].

## 2020-01-24 DIAGNOSIS — H25812 Combined forms of age-related cataract, left eye: Secondary | ICD-10-CM | POA: Diagnosis not present

## 2020-01-29 ENCOUNTER — Encounter (HOSPITAL_COMMUNITY): Payer: Self-pay

## 2020-01-29 ENCOUNTER — Other Ambulatory Visit: Payer: Self-pay

## 2020-01-29 ENCOUNTER — Other Ambulatory Visit (HOSPITAL_COMMUNITY): Payer: Medicare HMO

## 2020-01-29 ENCOUNTER — Other Ambulatory Visit (HOSPITAL_COMMUNITY)
Admission: RE | Admit: 2020-01-29 | Discharge: 2020-01-29 | Disposition: A | Discharge status: Home / Self Care | Payer: Medicare HMO | Source: Ambulatory Visit | Attending: Ophthalmology | Admitting: Ophthalmology

## 2020-01-29 ENCOUNTER — Encounter (HOSPITAL_COMMUNITY)
Admission: RE | Admit: 2020-01-29 | Discharge: 2020-01-29 | Disposition: A | Discharge status: Home / Self Care | Payer: Medicare HMO | Source: Ambulatory Visit | Attending: Ophthalmology | Admitting: Ophthalmology

## 2020-01-29 DIAGNOSIS — Z20822 Contact with and (suspected) exposure to covid-19: Secondary | ICD-10-CM | POA: Diagnosis not present

## 2020-01-29 DIAGNOSIS — Z01818 Encounter for other preprocedural examination: Secondary | ICD-10-CM | POA: Diagnosis not present

## 2020-01-29 LAB — BASIC METABOLIC PANEL
Anion gap: 11 (ref 5–15)
BUN: 18 mg/dL (ref 8–23)
CO2: 31 mmol/L (ref 22–32)
Calcium: 9.2 mg/dL (ref 8.9–10.3)
Chloride: 100 mmol/L (ref 98–111)
Creatinine, Ser: 1.14 mg/dL (ref 0.61–1.24)
GFR calc Af Amer: >60 mL/min (ref >60)
GFR calc non Af Amer: >60 mL/min (ref >60)
Glucose, Bld: 115 mg/dL — ABNORMAL HIGH (ref 70–99)
Potassium: 3.4 mmol/L — ABNORMAL LOW (ref 3.5–5.1)
Sodium: 142 mmol/L (ref 135–145)

## 2020-01-29 LAB — SARS CORONAVIRUS 2 (TAT 6-24 HRS): SARS Coronavirus 2: NEGATIVE

## 2020-01-30 ENCOUNTER — Other Ambulatory Visit (HOSPITAL_COMMUNITY): Payer: Medicare HMO

## 2020-02-01 ENCOUNTER — Encounter (HOSPITAL_COMMUNITY)
Admission: RE | Disposition: A | Discharge status: Home / Self Care | Payer: Self-pay | Source: Home / Self Care | Attending: Ophthalmology

## 2020-02-01 ENCOUNTER — Ambulatory Visit (HOSPITAL_COMMUNITY): Payer: Medicare HMO | Admitting: Anesthesiology

## 2020-02-01 ENCOUNTER — Other Ambulatory Visit: Payer: Self-pay

## 2020-02-01 ENCOUNTER — Ambulatory Visit (HOSPITAL_COMMUNITY)
Admission: RE | Admit: 2020-02-01 | Discharge: 2020-02-01 | Disposition: A | Discharge status: Home / Self Care | Payer: Medicare HMO | Attending: Ophthalmology | Admitting: Ophthalmology

## 2020-02-01 ENCOUNTER — Encounter (HOSPITAL_COMMUNITY): Payer: Self-pay | Admitting: Ophthalmology

## 2020-02-01 DIAGNOSIS — M1990 Unspecified osteoarthritis, unspecified site: Secondary | ICD-10-CM | POA: Diagnosis not present

## 2020-02-01 DIAGNOSIS — K219 Gastro-esophageal reflux disease without esophagitis: Secondary | ICD-10-CM | POA: Insufficient documentation

## 2020-02-01 DIAGNOSIS — Z79899 Other long term (current) drug therapy: Secondary | ICD-10-CM | POA: Insufficient documentation

## 2020-02-01 DIAGNOSIS — Z791 Long term (current) use of non-steroidal anti-inflammatories (NSAID): Secondary | ICD-10-CM | POA: Diagnosis not present

## 2020-02-01 DIAGNOSIS — H2181 Floppy iris syndrome: Secondary | ICD-10-CM | POA: Insufficient documentation

## 2020-02-01 DIAGNOSIS — Z87891 Personal history of nicotine dependence: Secondary | ICD-10-CM | POA: Diagnosis not present

## 2020-02-01 DIAGNOSIS — H25812 Combined forms of age-related cataract, left eye: Secondary | ICD-10-CM | POA: Diagnosis not present

## 2020-02-01 DIAGNOSIS — I2510 Atherosclerotic heart disease of native coronary artery without angina pectoris: Secondary | ICD-10-CM | POA: Diagnosis not present

## 2020-02-01 DIAGNOSIS — I10 Essential (primary) hypertension: Secondary | ICD-10-CM | POA: Diagnosis not present

## 2020-02-01 DIAGNOSIS — Z7982 Long term (current) use of aspirin: Secondary | ICD-10-CM | POA: Diagnosis not present

## 2020-02-01 DIAGNOSIS — Z951 Presence of aortocoronary bypass graft: Secondary | ICD-10-CM | POA: Diagnosis not present

## 2020-02-01 HISTORY — PX: CATARACT EXTRACTION W/PHACO: SHX586

## 2020-02-01 SURGERY — PHACOEMULSIFICATION, CATARACT, WITH IOL INSERTION
Anesthesia: Monitor Anesthesia Care | Site: Eye | Laterality: Left

## 2020-02-01 MED ORDER — PHENYLEPHRINE HCL 2.5 % OP SOLN
1.0000 [drp] | OPHTHALMIC | Status: AC | PRN
  Administered 2020-02-01 (×3): 1 [drp] via OPHTHALMIC

## 2020-02-01 MED ORDER — FISH OIL 1200 MG PO CAPS: 2400.0000 mg | ORAL_CAPSULE | Freq: Two times a day (BID) | ORAL | 0 refills | Status: AC

## 2020-02-01 MED ORDER — TETRACAINE HCL 0.5 % OP SOLN
1.0000 [drp] | OPHTHALMIC | Status: AC | PRN
  Administered 2020-02-01 (×3): 1 [drp] via OPHTHALMIC

## 2020-02-01 MED ORDER — NEOMYCIN-POLYMYXIN-DEXAMETH 3.5-10000-0.1 OP SUSP
OPHTHALMIC | Status: DC | PRN
  Administered 2020-02-01: 1 [drp] via OPHTHALMIC

## 2020-02-01 MED ORDER — PHENYLEPHRINE-KETOROLAC 1-0.3 % IO SOLN
INTRAOCULAR | Status: DC | PRN
  Administered 2020-02-01: 500 mL via OPHTHALMIC

## 2020-02-01 MED ORDER — POVIDONE-IODINE 5 % OP SOLN
OPHTHALMIC | Status: DC | PRN
  Administered 2020-02-01: 1 via OPHTHALMIC

## 2020-02-01 MED ORDER — CYCLOPENTOLATE-PHENYLEPHRINE 0.2-1 % OP SOLN
1.0000 [drp] | OPHTHALMIC | Status: AC | PRN
  Administered 2020-02-01 (×3): 1 [drp] via OPHTHALMIC

## 2020-02-01 MED ORDER — BSS IO SOLN
INTRAOCULAR | Status: DC | PRN
  Administered 2020-02-01: 15 mL via INTRAOCULAR

## 2020-02-01 MED ORDER — SODIUM HYALURONATE 23 MG/ML IO SOLN
INTRAOCULAR | Status: DC | PRN
  Administered 2020-02-01: 0.6 mL via INTRAOCULAR

## 2020-02-01 MED ORDER — LIDOCAINE HCL (PF) 1 % IJ SOLN
INTRAOCULAR | Status: DC | PRN
  Administered 2020-02-01: 1 mL via OPHTHALMIC

## 2020-02-01 MED ORDER — PROVISC 10 MG/ML IO SOLN
INTRAOCULAR | Status: DC | PRN
  Administered 2020-02-01: 0.85 mL via INTRAOCULAR

## 2020-02-01 MED ORDER — LIDOCAINE HCL 3.5 % OP GEL
1.0000 "application " | Freq: Once | OPHTHALMIC | Status: AC
  Administered 2020-02-01: 1 via OPHTHALMIC

## 2020-02-01 SURGICAL SUPPLY — 13 items
CLOTH BEACON ORANGE TIMEOUT ST (SAFETY) ×2 IMPLANT
EYE SHIELD UNIVERSAL CLEAR (GAUZE/BANDAGES/DRESSINGS) ×2 IMPLANT
GLOVE BIOGEL PI IND STRL 7.0 (GLOVE) ×2 IMPLANT
GLOVE BIOGEL PI INDICATOR 7.0 (GLOVE) ×2
LENS ALC ACRYL/TECN (Ophthalmic Related) ×2 IMPLANT
NEEDLE HYPO 18GX1.5 BLUNT FILL (NEEDLE) ×2 IMPLANT
PAD ARMBOARD 7.5X6 YLW CONV (MISCELLANEOUS) ×2 IMPLANT
RING MALYGIN 7.0 (MISCELLANEOUS) ×2 IMPLANT
SYR TB 1ML LL NO SAFETY (SYRINGE) ×2 IMPLANT
TAPE SURG TRANSPORE 1 IN (GAUZE/BANDAGES/DRESSINGS) ×1 IMPLANT
TAPE SURGICAL TRANSPORE 1 IN (GAUZE/BANDAGES/DRESSINGS) ×1
VISCOELASTIC ADDITIONAL (OPHTHALMIC RELATED) ×2 IMPLANT
WATER STERILE IRR 250ML POUR (IV SOLUTION) ×2 IMPLANT

## 2020-02-01 NOTE — Progress Notes (Signed)
Patient c/o hair being wet after procedure. Dried off with towel, Dr. Janyth Pupa at bedside instructions to go to his office at discharge, lens card given to patient. Given ginger ale x2 cups

## 2020-02-01 NOTE — Anesthesia Preprocedure Evaluation (Signed)
Anesthesia Evaluation  Patient identified by MRN, date of birth, ID band Patient awake    Reviewed: Allergy & Precautions, NPO status , Patient's Chart, lab work & pertinent test results, reviewed documented beta blocker date and time   History of Anesthesia Complications Negative for: history of anesthetic complications  Airway Mallampati: III  TM Distance: >3 FB Neck ROM: Full    Dental  (+) Lower Dentures, Upper Dentures   Pulmonary former smoker,    Pulmonary exam normal breath sounds clear to auscultation       Cardiovascular Exercise Tolerance: Good hypertension, Pt. on medications and Pt. on home beta blockers + CAD and + CABG  Normal cardiovascular exam+ dysrhythmias Atrial Fibrillation  Rhythm:Irregular Rate:Normal     Neuro/Psych  Neuromuscular disease    GI/Hepatic Neg liver ROS, GERD  Medicated,  Endo/Other  negative endocrine ROS  Renal/GU Renal disease     Musculoskeletal  (+) Arthritis , Osteoarthritis,    Abdominal   Peds  Hematology   Anesthesia Other Findings   Reproductive/Obstetrics                            Anesthesia Physical Anesthesia Plan  ASA: III  Anesthesia Plan: MAC   Post-op Pain Management:    Induction:   PONV Risk Score and Plan: 1  Airway Management Planned: Nasal Cannula and Natural Airway  Additional Equipment:   Intra-op Plan:   Post-operative Plan:   Informed Consent: I have reviewed the patients History and Physical, chart, labs and discussed the procedure including the risks, benefits and alternatives for the proposed anesthesia with the patient or authorized representative who has indicated his/her understanding and acceptance.       Plan Discussed with: CRNA and Surgeon  Anesthesia Plan Comments:        Anesthesia Quick Evaluation

## 2020-02-01 NOTE — Op Note (Signed)
Date of procedure: 02/01/20  Pre-operative diagnosis: Visually significant combined-form age-related cataract, Left Eye; Poor dilation, Left eye (H25.812; H21.81)  Post-operative diagnosis: Visually significant cataract, Left Eye; Intra-operative Floppy Iris Syndrome, Left Eye  Procedure: Complex removal of cataract via phacoemulsification and insertion of intra-ocular lens Johnson and Bolton  +16.5D into the capsular bag of the Left Eye (CPT [phone])  Attending surgeon: Gerda Diss. Dexter Sauser, MD, MA  Anesthesia: MAC, Topical Akten  Complications: None  Estimated Blood Loss: <64m (minimal)  Specimens: None  Implants: As above  Indications:  Visually significant cataract, Left Eye  Procedure:  The patient was seen and identified in the pre-operative area. The operative eye was identified and dilated.  The operative eye was marked.  Topical anesthesia was administered to the operative eye.     The patient was then to the operative suite and placed in the supine position.  A timeout was performed confirming the patient, procedure to be performed, and all other relevant information.   The patient's face was prepped and draped in the usual fashion for intra-ocular surgery.  A lid speculum was placed into the operative eye and the surgical microscope moved into place and focused.  Poor dilation of the iris was confirmed.  An inferotemporal paracentesis was created using a 20 gauge paracentesis blade.  Shugarcaine was injected into the anterior chamber.  Viscoelastic was injected into the anterior chamber.  A temporal clear-corneal main wound incision was created using a 2.481mmicrokeratome.  A Malyugin ring was placed.  A continuous curvilinear capsulorrhexis was initiated using an irrigating cystitome and completed using capsulorrhexis forceps.  Hydrodissection and hydrodeliniation were performed.  Viscoelastic was injected into the anterior chamber.  A phacoemulsification handpiece and a  chopper as a second instrument were used to remove the nucleus and epinucleus. The irrigation/aspiration handpiece was used to remove any remaining cortical material.   The capsular bag was reinflated with viscoelastic, checked, and found to be intact.  The intraocular lens was inserted into the capsular bag and dialed into place using a Kuglen hook. The Malyugin ring was removed.  The irrigation/aspiration handpiece was used to remove any remaining viscoelastic.  The clear corneal wound and paracentesis wounds were then hydrated and checked with Weck-Cels to be watertight.  The lid-speculum and drape was removed, and the patient's face was cleaned with a wet and dry 4x4.  Maxitrol was instilled in the eye before a clear shield was taped over the eye. The patient was taken to the post-operative care unit in good condition, having tolerated the procedure well.  Post-Op Instructions: [facility] and will receive all other orders and instructions.

## 2020-02-01 NOTE — Transfer of Care (Signed)
Immediate Anesthesia Transfer of Care Note  Patient: Darius Grimes  Procedure(s) Performed: CATARACT EXTRACTION PHACO AND INTRAOCULAR LENS PLACEMENT (IOC) (Left Eye)  Patient Location: PACU  Anesthesia Type:MAC  Level of Consciousness: awake, alert , oriented and patient cooperative  Airway & Oxygen Therapy: Patient Spontanous Breathing  Post-op Assessment: Report given to RN, Post -op Vital signs reviewed and stable and Patient moving all extremities X 4  Post vital signs: Reviewed and stable  Last Vitals:  Vitals Value Taken Time  BP    Temp    Pulse    Resp    SpO2      Last Pain:  Vitals:   02/01/20 1031  TempSrc: Oral  PainSc: 0-No pain      Patients Stated Pain Goal: 8 (78/67/67 2094)  Complications: No apparent anesthesia complications

## 2020-02-01 NOTE — Anesthesia Postprocedure Evaluation (Signed)
Anesthesia Post Note  Patient: Darius Grimes  Procedure(s) Performed: CATARACT EXTRACTION PHACO AND INTRAOCULAR LENS PLACEMENT ([place]) (Left Eye)  Patient location during evaluation: Phase II Anesthesia Type: MAC Level of consciousness: awake, oriented, awake and alert and patient cooperative Pain management: satisfactory to patient Vital Signs Assessment: post-procedure vital signs reviewed and stable Respiratory status: spontaneous breathing, respiratory function stable and nonlabored ventilation Cardiovascular status: stable Postop Assessment: no apparent nausea or vomiting Anesthetic complications: no     Last Vitals:  Vitals:   02/01/20 1031  BP: 133/77  Pulse: 68  Resp: 20  Temp: 37 C  SpO2: 95%    Last Pain:  Vitals:   02/01/20 1031  TempSrc: Oral  PainSc: 0-No pain                 Treyana Sturgell

## 2020-02-01 NOTE — Interval H&P Note (Signed)
History and Physical Interval Note: The H and P was reviewed and updated. The patient was examined.  No changes were found after exam.  The surgical eye was marked.  02/01/2020 11:25 AM  Darius Grimes  has presented today for surgery, with the diagnosis of Nuclear sclerotic cataract - Left eye.  The various methods of treatment have been discussed with the patient and family. After consideration of risks, benefits and other options for treatment, the patient has consented to  Procedure(s) with comments: CATARACT EXTRACTION PHACO AND INTRAOCULAR LENS PLACEMENT (IOC) (Left) - CDE:  as a surgical intervention.  The patient's history has been reviewed, patient examined, no change in status, stable for surgery.  I have reviewed the patient's chart and labs.  Questions were answered to the patient's satisfaction.     Baruch Goldmann

## 2020-02-08 NOTE — H&P (Signed)
Surgical History & Physical  Patient Name: Darius Grimes DOB: 01-25-1946  Surgery: Removal of retained lens fragments; Left Eye  Surgeon: Baruch Goldmann MD Surgery Date:  02/15/2020 Pre-Op Date:  02/07/2020  HPI: A 67 Yr. old male patient 1. The patient is returning after cataract surgery. The left eye is affected. Since the last visit, the affected area feels improvement. The patient's vision is stable. The condition's severity is none. Patient is following medication instructions. PT reports seeing a slight "flickers" and floaters in the left eye, mainly in periphery. 2. 2. The patient complains of difficulty when driving due to glare from headlights or sun, which began since last visit. The right eye is affected. The episode is gradual. Distance vision is worse than near. The complaint is associated with blurry vision, glare, halos and light sensitivity. This is negatively affecting the patient's quality of life. HPI was performed by Baruch Goldmann .  Medical History: Cataracts Acid reflux BPH with lower urinary tract symptoms... Arthritis Heart Problem High Blood Pressure LDL  Review of Systems Ear, Nose, Mouth & Throat hearing loss Musculoskeletal Joint Ache, Stiffness  All recorded systems are negative except as noted above.  Social   Never smoked   Medication Prednisolone-gatiflox-bromfenac,  Fluticasone, Furosemide, Ibuprofen, Lisinopril, Metoprolol, Potassium chloride, Ranitidine, Tamsulosin, Aspirin, Loratadine, Glucosamine Sulfate, Multivitamin, Amlodipine Besylate, Diclofenac, Finesteride,   Sx/Procedures Phaco c IOL OS,   Drug Allergies   NKDA  History & Physical: Heent:  Retained lens fragments, Left eye NECK: supple without bruits LUNGS: lungs clear to auscultation CV: regular rate and rhythm Abdomen: soft and non-tender Impression & Plan: Assessment: 1.  CATARACT EXTRACTION STATUS; Left Eye (Z98.42) 2.  LENS FRAGMENTS IN EYE FOLLOWING CATARACT SURGERY;  Left Eye (H59.022) 3.  NUCLEAR SCLEROSIS AGE RELATED; Right Eye (H25.11)  Plan: 1.  Cortical fragment in anterior chamber. Required lens fragment extraction. Risks, benefits, alternatives, and complications discussed. 2.  As above.

## 2020-02-12 ENCOUNTER — Other Ambulatory Visit: Payer: Self-pay

## 2020-02-12 ENCOUNTER — Encounter (HOSPITAL_COMMUNITY)
Admission: RE | Admit: 2020-02-12 | Discharge: 2020-02-12 | Disposition: A | Discharge status: Home / Self Care | Payer: Medicare HMO | Source: Ambulatory Visit | Attending: Ophthalmology | Admitting: Ophthalmology

## 2020-02-12 NOTE — Pre-Procedure Instructions (Addendum)
Called patient for PAT. Patient was very upset about, "I am not paying $375 to reserve an operating room to have another surgery done on my left eye. It's not my fault that the doctor did not get everything he should have. I have sent an e-mail to his office and they are supposed to call and let me know if the doctor is going to pay for this or not. I will pay $375 for the room for my right eye but not my left. This thing is on hold until I hear from their office." I told patient I understood and then proceeded to tell him what time to arrive on Friday which was 1030 and patient starts being upset again stating, " that's another thing. I'm not coming over there at 1030 and waiting until 1230 to take me back, that happened last time. I don't mind coming at 1100 so you guys can get your hour of pay, but I am not sitting and waiting again". I tried to explain that this was protocol for all patients and he states, "well, its crazy and I don't know if I'm coming." he hung up before I could tell him about NPO after midnight except for meds, or which meds to take, or remind him of his COVID test tomorrow. I spoke with Blair Hailey, OR scheduler and she states office is aware of difficulty with this. Abbie Sons, RN coordinator also notified.

## 2020-02-13 ENCOUNTER — Other Ambulatory Visit (HOSPITAL_COMMUNITY)
Admission: RE | Admit: 2020-02-13 | Discharge: 2020-02-13 | Disposition: A | Discharge status: Home / Self Care | Payer: Medicare HMO | Source: Ambulatory Visit | Attending: Ophthalmology | Admitting: Ophthalmology

## 2020-02-13 ENCOUNTER — Other Ambulatory Visit: Payer: Self-pay

## 2020-02-13 DIAGNOSIS — Z01812 Encounter for preprocedural laboratory examination: Secondary | ICD-10-CM | POA: Diagnosis not present

## 2020-02-13 DIAGNOSIS — Z20822 Contact with and (suspected) exposure to covid-19: Secondary | ICD-10-CM | POA: Diagnosis not present

## 2020-02-14 LAB — SARS CORONAVIRUS 2 (TAT 6-24 HRS): SARS Coronavirus 2: NEGATIVE

## 2020-02-15 ENCOUNTER — Encounter (HOSPITAL_COMMUNITY)
Admission: RE | Disposition: A | Discharge status: Home / Self Care | Payer: Self-pay | Source: Home / Self Care | Attending: Ophthalmology

## 2020-02-15 ENCOUNTER — Other Ambulatory Visit: Payer: Self-pay

## 2020-02-15 ENCOUNTER — Encounter (HOSPITAL_COMMUNITY): Payer: Self-pay | Admitting: Ophthalmology

## 2020-02-15 ENCOUNTER — Ambulatory Visit (HOSPITAL_COMMUNITY): Payer: Medicare HMO | Admitting: Anesthesiology

## 2020-02-15 ENCOUNTER — Ambulatory Visit (HOSPITAL_COMMUNITY)
Admission: RE | Admit: 2020-02-15 | Discharge: 2020-02-15 | Disposition: A | Discharge status: Home / Self Care | Payer: Medicare HMO | Attending: Ophthalmology | Admitting: Ophthalmology

## 2020-02-15 DIAGNOSIS — M1990 Unspecified osteoarthritis, unspecified site: Secondary | ICD-10-CM | POA: Diagnosis not present

## 2020-02-15 DIAGNOSIS — Z7982 Long term (current) use of aspirin: Secondary | ICD-10-CM | POA: Insufficient documentation

## 2020-02-15 DIAGNOSIS — I2510 Atherosclerotic heart disease of native coronary artery without angina pectoris: Secondary | ICD-10-CM | POA: Diagnosis not present

## 2020-02-15 DIAGNOSIS — H59022 Cataract (lens) fragments in eye following cataract surgery, left eye: Secondary | ICD-10-CM | POA: Insufficient documentation

## 2020-02-15 DIAGNOSIS — K219 Gastro-esophageal reflux disease without esophagitis: Secondary | ICD-10-CM | POA: Insufficient documentation

## 2020-02-15 DIAGNOSIS — N401 Enlarged prostate with lower urinary tract symptoms: Secondary | ICD-10-CM | POA: Insufficient documentation

## 2020-02-15 DIAGNOSIS — Z87891 Personal history of nicotine dependence: Secondary | ICD-10-CM | POA: Insufficient documentation

## 2020-02-15 DIAGNOSIS — G709 Myoneural disorder, unspecified: Secondary | ICD-10-CM | POA: Diagnosis not present

## 2020-02-15 DIAGNOSIS — Z791 Long term (current) use of non-steroidal anti-inflammatories (NSAID): Secondary | ICD-10-CM | POA: Insufficient documentation

## 2020-02-15 DIAGNOSIS — Z79899 Other long term (current) drug therapy: Secondary | ICD-10-CM | POA: Diagnosis not present

## 2020-02-15 DIAGNOSIS — I10 Essential (primary) hypertension: Secondary | ICD-10-CM | POA: Insufficient documentation

## 2020-02-15 HISTORY — PX: CATARACT EXTRACTION W/PHACO: SHX586

## 2020-02-15 SURGERY — PHACOEMULSIFICATION, CATARACT, WITH IOL INSERTION
Anesthesia: Monitor Anesthesia Care | Site: Eye | Laterality: Left

## 2020-02-15 MED ORDER — TETRACAINE HCL 0.5 % OP SOLN: 1.0000 [drp] | OPHTHALMIC | Status: DC | PRN

## 2020-02-15 MED ORDER — BSS IO SOLN
INTRAOCULAR | Status: DC | PRN
  Administered 2020-02-15: 15 mL via INTRAOCULAR

## 2020-02-15 MED ORDER — EPINEPHRINE PF 1 MG/ML IJ SOLN
INTRAOCULAR | Status: DC | PRN
  Administered 2020-02-15: 500 mL

## 2020-02-15 MED ORDER — POVIDONE-IODINE 5 % OP SOLN
OPHTHALMIC | Status: DC | PRN
  Administered 2020-02-15: 1 via OPHTHALMIC

## 2020-02-15 MED ORDER — LIDOCAINE HCL 3.5 % OP GEL
1.0000 "application " | Freq: Once | OPHTHALMIC | Status: AC
  Administered 2020-02-15: 1 via OPHTHALMIC

## 2020-02-15 MED ORDER — PROVISC 10 MG/ML IO SOLN
INTRAOCULAR | Status: DC | PRN
  Administered 2020-02-15: 0.85 mL via INTRAOCULAR

## 2020-02-15 MED ORDER — NEOMYCIN-POLYMYXIN-DEXAMETH 3.5-10000-0.1 OP SUSP
OPHTHALMIC | Status: DC | PRN
  Administered 2020-02-15: 1 [drp] via OPHTHALMIC

## 2020-02-15 SURGICAL SUPPLY — 10 items
CLOTH BEACON ORANGE TIMEOUT ST (SAFETY) ×2 IMPLANT
EYE SHIELD UNIVERSAL CLEAR (GAUZE/BANDAGES/DRESSINGS) ×2 IMPLANT
GLOVE BIOGEL PI IND STRL 7.0 (GLOVE) ×2 IMPLANT
GLOVE BIOGEL PI INDICATOR 7.0 (GLOVE) ×2
PAD ARMBOARD 7.5X6 YLW CONV (MISCELLANEOUS) ×2 IMPLANT
PROC W NO LENS (INTRAOCULAR LENS) ×2
PROCESS W NO LENS (INTRAOCULAR LENS) ×1 IMPLANT
TAPE SURG TRANSPORE 1 IN (GAUZE/BANDAGES/DRESSINGS) ×1 IMPLANT
TAPE SURGICAL TRANSPORE 1 IN (GAUZE/BANDAGES/DRESSINGS) ×1
WATER STERILE IRR 250ML POUR (IV SOLUTION) ×2 IMPLANT

## 2020-02-15 NOTE — Transfer of Care (Signed)
Immediate Anesthesia Transfer of Care Note  Patient: Darius Grimes  Procedure(s) Performed: REMOVAL OF RETAINED LENS FRAGMENTS LEFT EYE (Left Eye)  Patient Location: Short Stay  Anesthesia Type:MAC  Level of Consciousness: awake, alert , oriented and patient cooperative  Airway & Oxygen Therapy: Patient Spontanous Breathing  Post-op Assessment: Report given to RN and Post -op Vital signs reviewed and stable  Post vital signs: Reviewed and stable  Last Vitals:  Vitals Value Taken Time  BP    Temp    Pulse    Resp    SpO2      Last Pain:  Vitals:   02/15/20 1105  TempSrc: Oral  PainSc:          Complications: No complications documented.

## 2020-02-15 NOTE — Anesthesia Preprocedure Evaluation (Signed)
Anesthesia Evaluation  Patient identified by MRN, date of birth, ID band Patient awake    Reviewed: Allergy & Precautions, H&P , NPO status , Patient's Chart, lab work & pertinent test results, reviewed documented beta blocker date and time   Airway Mallampati: II  TM Distance: >3 FB Neck ROM: full    Dental no notable dental hx.    Pulmonary neg pulmonary ROS, former smoker,    Pulmonary exam normal breath sounds clear to auscultation       Cardiovascular Exercise Tolerance: Good hypertension, + CAD   Rhythm:regular Rate:Normal     Neuro/Psych  Neuromuscular disease negative psych ROS   GI/Hepatic negative GI ROS, Neg liver ROS, GERD  Medicated,  Endo/Other  negative endocrine ROS  Renal/GU Renal disease  negative genitourinary   Musculoskeletal   Abdominal   Peds  Hematology negative hematology ROS (+)   Anesthesia Other Findings   Reproductive/Obstetrics negative OB ROS                             Anesthesia Physical Anesthesia Plan  ASA: II  Anesthesia Plan: MAC   Post-op Pain Management:    Induction:   PONV Risk Score and Plan: 2  Airway Management Planned:   Additional Equipment:   Intra-op Plan:   Post-operative Plan:   Informed Consent: I have reviewed the patients History and Physical, chart, labs and discussed the procedure including the risks, benefits and alternatives for the proposed anesthesia with the patient or authorized representative who has indicated his/her understanding and acceptance.     Dental Advisory Given  Plan Discussed with: CRNA  Anesthesia Plan Comments:         Anesthesia Quick Evaluation

## 2020-02-15 NOTE — Anesthesia Postprocedure Evaluation (Signed)
Anesthesia Post Note  Patient: Darius Grimes  Procedure(s) Performed: REMOVAL OF RETAINED LENS FRAGMENTS LEFT EYE (Left Eye)  Patient location during evaluation: Short Stay Anesthesia Type: MAC Level of consciousness: awake and alert Pain management: pain level controlled Vital Signs Assessment: post-procedure vital signs reviewed and stable Respiratory status: spontaneous breathing Cardiovascular status: stable Postop Assessment: no apparent nausea or vomiting Anesthetic complications: no   No complications documented.   Last Vitals:  Vitals:   02/15/20 1105  BP: 129/83  Pulse: 79  Resp: 18  Temp: 36.9 C  SpO2: 96%    Last Pain:  Vitals:   02/15/20 1105  TempSrc: Oral  PainSc:                  Everette Rank

## 2020-02-15 NOTE — Interval H&P Note (Signed)
History and Physical Interval Note:  02/15/2020 11:44 AM  Darius Grimes  has presented today for surgery, with the diagnosis of RETAINED LENS FRAGMENTS.  The various methods of treatment have been discussed with the patient and family. After consideration of risks, benefits and other options for treatment, the patient has consented to  Procedure(s): CATARACT REMOVAL OF RETAINED LENS FRAGMENTS (Left) as a surgical intervention.  The patient's history has been reviewed, patient examined, no change in status, stable for surgery.  I have reviewed the patient's chart and labs.  Questions were answered to the patient's satisfaction.     Baruch Goldmann

## 2020-02-15 NOTE — Discharge Instructions (Addendum)
Please discharge patient when stable, will follow up today with Dr. Wrzosek at the [facility] office immediately following discharge.  Leave shield in place until visit.  All paperwork with discharge instructions will be given at the office.  [facility] Address:  [address]  [place], [place] [address]  

## 2020-02-15 NOTE — Op Note (Signed)
Date of procedure: 02/15/20  Pre-operative diagnosis: Retained lens fragments after cataract surgery, left eye  Post-operative diagnosis: Retained lens fragments after cataract surgery, left eye  Procedure: Removal of retained lens fragments from the anterior chamber of the left eye  Attending surgeon: Gerda Diss. Menachem Urbanek, MD, MA  Anesthesia: MAC, Topical Akten  Complications: None  Estimated Blood Loss: <69m (minimal)  Specimens: None  Implants: As above  Indications:  Retained lens fragments after cataract surgery, left eye  Procedure:  The patient was seen and identified in the pre-operative area. The operative eye was identified and dilated.  The operative eye was marked.  Topical anesthesia was administered to the operative eye.     The patient was then to the operative suite and placed in the supine position.  A timeout was performed confirming the patient, procedure to be performed, and all other relevant information.   The patient's face was prepped and draped in the usual fashion for intra-ocular surgery.  A lid speculum was placed into the operative eye and the surgical microscope moved into place and focused.  An inferotemporal and superotemporal paracentesis was created using a 20 gauge paracentesis blade.  Provisc was injected into the anterior chamber.   The bimanual irrigation and aspiration was used to remove any remaining cortical material.  All Proviisc was removed.  The paracentesis wounds were then hydrated and checked with Weck-Cels to be watertight.  The lid-speculum and drape was removed, and the patient's face was cleaned with a wet and dry 4x4.  Maxitrol was instilled in the eye before a clear shield was taped over the eye. The patient was taken to the post-operative care unit in good condition, having tolerated the procedure well.  Post-Op Instructions: [facility] and will receive all  other orders and instructions.

## 2020-02-18 NOTE — Addendum Note (Signed)
Addendum  created 02/18/20 1058 by Vista Deck, CRNA   Charge Capture section accepted

## 2020-02-19 ENCOUNTER — Encounter (HOSPITAL_COMMUNITY): Payer: Self-pay | Admitting: Ophthalmology

## 2020-04-01 ENCOUNTER — Encounter (HOSPITAL_COMMUNITY): Payer: Medicare HMO

## 2020-04-02 ENCOUNTER — Other Ambulatory Visit (HOSPITAL_COMMUNITY)
Admission: RE | Admit: 2020-04-02 | Discharge: 2020-04-02 | Disposition: A | Discharge status: Home / Self Care | Payer: Medicare HMO | Source: Ambulatory Visit | Attending: Ophthalmology | Admitting: Ophthalmology

## 2020-04-02 NOTE — Progress Notes (Signed)
Patient's procedure was cancelled, not Covid Screening. Left Registration know in appointment notes. I'll let Tomi know, unless she saw the appointment note and already cancelled the order. Nothing further needed.

## 2020-04-03 ENCOUNTER — Ambulatory Visit (INDEPENDENT_AMBULATORY_CARE_PROVIDER_SITE_OTHER): Payer: Self-pay | Admitting: *Deleted

## 2020-04-03 ENCOUNTER — Other Ambulatory Visit: Payer: Self-pay

## 2020-04-03 VITALS — Ht 70.0 in | Wt 266.6 lb

## 2020-04-03 DIAGNOSIS — Z1211 Encounter for screening for malignant neoplasm of colon: Secondary | ICD-10-CM

## 2020-04-03 MED ORDER — PEG 3350-KCL-NA BICARB-NACL 420 G PO SOLR
4000.0000 mL | Freq: Once | ORAL | 0 refills | Status: AC
Start: 2020-04-03 — End: 2020-04-03

## 2020-04-03 NOTE — Patient Instructions (Signed)
Darius Grimes   02-Jan-1946 MRN: [id number]    Procedure Date: 05/12/2020 Time to register: 7:30 am Place to register: [facility] Short [facility] Procedure Time: 9:00 am Scheduled provider: Dr. Abbey Chatters  PREPARATION FOR COLONOSCOPY WITH TRI-LYTE SPLIT PREP  Please notify us immediately if you are diabetic, take iron supplements, or if you are on Coumadin or any other blood thinners.   Please hold the following medications: n/a  You will need to purchase 1 fleet enema and 1 box of Bisacodyl 32m tablets.   2 DAYS BEFORE PROCEDURE:  DATE: 05/10/2020   DAY: Saturday Begin clear liquid diet AFTER your lunch meal. NO SOLID FOODS after this point.  1 DAY BEFORE PROCEDURE:  DATE: 05/11/2020   DAY: Sunday Continue clear liquids the entire day - NO SOLID FOOD.   Diabetic medications adjustments for today: n/a  At 2:00 pm:  Take 2 Bisacodyl tablets.   At 4:00pm:  Start drinking your solution. Make sure you mix well per instructions on the bottle. Try to drink 1 (one) 8 ounce glass every 10-15 minutes until you have consumed HALF the jug. You should complete by 6:00pm.You must keep the left over solution refrigerated until completed next day.  Continue clear liquids. You must drink plenty of clear liquids to prevent dehyration and kidney failure.     DAY OF PROCEDURE:   DATE: 05/12/2020   DAY: Monday If you take medications for your heart, blood pressure or breathing, you may take these medications.  Diabetic medications adjustments for today: n/a  Five hours before your procedure time @ 4:00 am:  Finish remaining amout of bowel prep, drinking 1 (one) 8 ounce glass every 10-15 minutes until complete. You have two hours to consume remaining prep.   Three hours before your procedure time @ 6:00 am:  Nothing by mouth.   At least one hour before going to the hospital:  Give yourself one Fleet enema. You may take your morning medications with sip of water unless we have instructed otherwise.       Please see below for Dietary Information.  CLEAR LIQUIDS INCLUDE:  Water Jello (NOT red in color)   Ice Popsicles (NOT red in color)   Tea (sugar ok, no milk/cream) Powdered fruit flavored drinks  Coffee (sugar ok, no milk/cream) Gatorade/ Lemonade/ Kool-Aid  (NOT red in color)   Juice: apple, white grape, white cranberry Soft drinks  Clear bullion, consomme, broth (fat free beef/chicken/vegetable)  Carbonated beverages (any kind)  Strained chicken noodle soup Hard Candy   Remember: Clear liquids are liquids that will allow you to see your fingers on the other side of a clear glass. Be sure liquids are NOT red in color, and not cloudy, but CLEAR.  DO NOT EAT OR DRINK ANY OF THE FOLLOWING:  Dairy products of any kind   Cranberry juice Tomato juice / V8 juice   Grapefruit juice Orange juice     Red grape juice  Do not eat any solid foods, including such foods as: cereal, oatmeal, yogurt, fruits, vegetables, creamed soups, eggs, bread, crackers, pureed foods in a blender, etc.   HELPFUL HINTS FOR DRINKING PREP SOLUTION:   Make sure prep is extremely cold. Mix and refrigerate the the morning of the prep. You may also put in the freezer.   You may try mixing some Crystal Light or Country Time Lemonade if you prefer. Mix in small amounts; add more if necessary.  Try drinking through a straw  Rinse mouth with  water or a mouthwash between glasses, to remove after-taste.  Try sipping on a cold beverage /ice/ popsicles between glasses of prep.  Place a piece of sugar-free hard candy in mouth between glasses.  If you become nauseated, try consuming smaller amounts, or stretch out the time between glasses. Stop for 30-60 minutes, then slowly start back drinking.        OTHER INSTRUCTIONS  You will need a responsible adult at least 73 years of age to accompany you and drive you home. This person must remain in the waiting room during your procedure. The hospital will cancel  your procedure if you do not have a responsible adult with you.   1. Wear loose fitting clothing that is easily removed. 2. Leave jewelry and other valuables at home.  3. Remove all body piercing jewelry and leave at home. 4. Total time from sign-in until discharge is approximately 2-3 hours. 5. You should go home directly after your procedure and rest. You can resume normal activities the day after your procedure. 6. The day of your procedure you should not:  Drive  Make legal decisions  Operate machinery  Drink alcohol  Return to work   You may call the office (Dept: [phone]) before 5:00pm, or page the doctor on call [phone]) after 5:00pm, for further instructions, if necessary.   Insurance Information YOU WILL NEED TO CHECK WITH YOUR INSURANCE COMPANY FOR THE BENEFITS OF COVERAGE YOU HAVE FOR THIS PROCEDURE.  UNFORTUNATELY, NOT ALL INSURANCE COMPANIES HAVE BENEFITS TO COVER ALL OR PART OF THESE TYPES OF PROCEDURES.  IT IS YOUR RESPONSIBILITY TO CHECK YOUR BENEFITS, HOWEVER, WE WILL BE GLAD TO ASSIST YOU WITH ANY CODES YOUR INSURANCE COMPANY MAY NEED.    PLEASE NOTE THAT MOST INSURANCE COMPANIES WILL NOT COVER A SCREENING COLONOSCOPY FOR PEOPLE UNDER THE AGE OF 50  IF YOU HAVE BCBS INSURANCE, YOU MAY HAVE BENEFITS FOR A SCREENING COLONOSCOPY BUT IF POLYPS ARE FOUND THE DIAGNOSIS WILL CHANGE AND THEN YOU MAY HAVE A DEDUCTIBLE THAT WILL NEED TO BE MET. SO PLEASE MAKE SURE YOU CHECK YOUR BENEFITS FOR A SCREENING COLONOSCOPY AS WELL AS A DIAGNOSTIC COLONOSCOPY.

## 2020-04-03 NOTE — Progress Notes (Addendum)
Gastroenterology Pre-Procedure Review  Request Date: 04/03/2020 Requesting Physician: Dr. Wende Neighbors, Last TCS 07/19/2016 done at [facility] by Dr. Laurance Flatten, no polyps, personal hx of polyps  PATIENT REVIEW QUESTIONS: The patient responded to the following health history questions as indicated:    1. Diabetes Melitis: no 2. Joint replacements in the past 12 months: no 3. Major health problems in the past 3 months: no 4. Has an artificial valve or MVP: no 5. Has a defibrillator: no 6. Has been advised in past to take antibiotics in advance of a procedure like teeth cleaning: no 7. Family history of colon cancer: no  8. Alcohol Use: no 9. Illicit drug Use: no 10. History of sleep apnea: no 11. History of coronary artery or other vascular stents placed within the last 12 months: no 12. History of any prior anesthesia complications: no 13. Body mass index is 38.25 kg/m.    MEDICATIONS & ALLERGIES:    Patient reports the following regarding taking any blood thinners:   Plavix? no Aspirin? yes Coumadin? no Brilinta? no Xarelto? no Eliquis? no Pradaxa? no Savaysa? no Effient? no  Patient confirms/reports the following medications:  Current Outpatient Medications  Medication Sig Dispense Refill  . acetaminophen (TYLENOL) 325 MG tablet Take 325 mg by mouth in the morning and at bedtime.    [name] albuterol (VENTOLIN HFA) 108 (90 Base) MCG/ACT inhaler Inhale into the lungs as needed for wheezing or shortness of breath.    [name] amLODipine (NORVASC) 10 MG tablet Take 10 mg by mouth at bedtime.     [name] aspirin 81 MG tablet Take 81 mg by mouth daily.     [name] docusate sodium (COLACE) 100 MG capsule Take 100 mg by mouth daily as needed for mild constipation or moderate constipation.     . finasteride (PROSCAR) 5 MG tablet Take 5 mg by mouth daily.    . fluticasone (FLONASE) 50 MCG/ACT nasal spray Place 2 sprays into both nostrils daily as needed for rhinitis.     [name] lisinopril (PRINIVIL,ZESTRIL) 40 MG tablet  Take 40 mg by mouth daily.    . meloxicam (MOBIC) 15 MG tablet Take 15 mg by mouth daily as needed for pain.     . metoprolol tartrate (LOPRESSOR) 50 MG tablet Take 50 mg by mouth 2 (two) times daily.    . Multiple Vitamin (MULTIVITAMIN WITH MINERALS) TABS tablet Take 1 tablet by mouth daily.    . Omega-3 Fatty Acids (FISH OIL) 1200 MG CAPS Take 2 capsules (2,400 mg total) by mouth in the morning and at bedtime. 60 capsule 0  . potassium chloride SA (KLOR-CON) 20 MEQ tablet Take 40 mEq by mouth 2 (two) times daily.    . Psyllium (METAMUCIL FIBER PO) Take by mouth as needed.    . rosuvastatin (CRESTOR) 10 MG tablet Take 10 mg by mouth as needed. Takes 1/2 pill as needed.    . tamsulosin (FLOMAX) 0.4 MG CAPS capsule Take 0.4 mg by mouth daily.     [name] torsemide (DEMADEX) 20 MG tablet Take 40 mg by mouth in the morning and at bedtime.     No current facility-administered medications for this visit.    Patient confirms/reports the following allergies:  No Known Allergies  No orders of the defined types were placed in this encounter.   AUTHORIZATION INFORMATION Primary Insurance: Bernadene Person,  ID #: [id number] ,  Group #: [id number] Pre-Cert / Auth required: No, not required  SCHEDULE INFORMATION: Procedure has been scheduled as  follows:  Date: 05/12/2020, Time: 9:00 Location: [facility] with Dr. Abbey Chatters  This Gastroenterology Pre-Precedure Review Form is being routed to the following provider(s): Roseanne Kaufman, NP

## 2020-04-04 ENCOUNTER — Ambulatory Visit: Admit: 2020-04-04 | Payer: Medicare HMO | Admitting: Ophthalmology

## 2020-04-04 SURGERY — PHACOEMULSIFICATION, CATARACT, WITH IOL INSERTION
Anesthesia: Monitor Anesthesia Care | Laterality: Right

## 2020-04-21 NOTE — Progress Notes (Signed)
We need to get procedures notes if we can. May not be due for colonoscopy just yet.

## 2020-04-24 NOTE — Progress Notes (Signed)
Records received and placed in Roseanne Kaufman, NP's box for review.

## 2020-04-25 NOTE — Progress Notes (Signed)
Received path report from 2007 and 2012: tubular adenomas, negative high grade dysplasia. 2017 actual report: normal ileum, diverticulosis, normal rectum, no polyps.   He will be due in 2022 due to history of polyps. We can place on recall and triage closer to Nov 2022 when due, unless having problems.

## 2020-04-28 NOTE — Progress Notes (Signed)
Called pt and informed him that he is not due for repeat colonoscopy until Nov 2022 after Roseanne Kaufman, NP's review of prior procedure records.  Informed him that we were cancelling his procedure and would place him on our recall list for Nov 2022.  Pt denies having any current GI problems at this time so no ov needed.  Pt voiced understanding to all information given.

## 2020-05-02 DIAGNOSIS — E559 Vitamin D deficiency, unspecified: Secondary | ICD-10-CM | POA: Diagnosis not present

## 2020-05-02 DIAGNOSIS — E781 Pure hyperglyceridemia: Secondary | ICD-10-CM | POA: Diagnosis not present

## 2020-05-02 DIAGNOSIS — R7303 Prediabetes: Secondary | ICD-10-CM | POA: Diagnosis not present

## 2020-05-02 DIAGNOSIS — I2510 Atherosclerotic heart disease of native coronary artery without angina pectoris: Secondary | ICD-10-CM | POA: Diagnosis not present

## 2020-05-02 DIAGNOSIS — I10 Essential (primary) hypertension: Secondary | ICD-10-CM | POA: Diagnosis not present

## 2020-05-02 DIAGNOSIS — E538 Deficiency of other specified B group vitamins: Secondary | ICD-10-CM | POA: Diagnosis not present

## 2020-05-02 DIAGNOSIS — L03115 Cellulitis of right lower limb: Secondary | ICD-10-CM | POA: Diagnosis not present

## 2020-05-02 DIAGNOSIS — R609 Edema, unspecified: Secondary | ICD-10-CM | POA: Diagnosis not present

## 2020-05-02 DIAGNOSIS — D519 Vitamin B12 deficiency anemia, unspecified: Secondary | ICD-10-CM | POA: Diagnosis not present

## 2020-05-08 DIAGNOSIS — N400 Enlarged prostate without lower urinary tract symptoms: Secondary | ICD-10-CM | POA: Diagnosis not present

## 2020-05-08 DIAGNOSIS — K219 Gastro-esophageal reflux disease without esophagitis: Secondary | ICD-10-CM | POA: Diagnosis not present

## 2020-05-08 DIAGNOSIS — I2510 Atherosclerotic heart disease of native coronary artery without angina pectoris: Secondary | ICD-10-CM | POA: Diagnosis not present

## 2020-05-08 DIAGNOSIS — Z23 Encounter for immunization: Secondary | ICD-10-CM | POA: Diagnosis not present

## 2020-05-08 DIAGNOSIS — I480 Paroxysmal atrial fibrillation: Secondary | ICD-10-CM | POA: Diagnosis not present

## 2020-05-08 DIAGNOSIS — M170 Bilateral primary osteoarthritis of knee: Secondary | ICD-10-CM | POA: Diagnosis not present

## 2020-05-08 DIAGNOSIS — Z96652 Presence of left artificial knee joint: Secondary | ICD-10-CM | POA: Diagnosis not present

## 2020-05-08 DIAGNOSIS — I10 Essential (primary) hypertension: Secondary | ICD-10-CM | POA: Diagnosis not present

## 2020-05-08 DIAGNOSIS — R7303 Prediabetes: Secondary | ICD-10-CM | POA: Diagnosis not present

## 2020-05-08 DIAGNOSIS — E782 Mixed hyperlipidemia: Secondary | ICD-10-CM | POA: Diagnosis not present

## 2020-05-09 ENCOUNTER — Other Ambulatory Visit (HOSPITAL_COMMUNITY): Payer: Medicare HMO

## 2020-05-15 NOTE — H&P (Signed)
Surgical History & Physical  Patient Name: Waylen Depaolo DOB: 16-Jan-1946  Surgery: Cataract extraction with intraocular lens implant phacoemulsification; Right Eye  Surgeon: Baruch Goldmann MD Surgery Date:  05/26/2020 Pre-Op Date:  05/15/2020  HPI: A 4 Yr. old male patient The patient is returning after cataract surgery. Left eye are affected. Status post cataract surgery, which began 4 week ago: Since the last visit, the affected area is doing well. The patient's vision is stable. Pt still wearing bifocals rx from pre op. Patient is following medication instructions BID. The patient experiences no flashes, floater, shadow, curtain or veil. Patient also here for continued blurred vision OD. States that vision is very blurry in the right eye and is different than then left. This is negatively affecting the patient's quality of life. HPI was performed by Baruch Goldmann .  Medical History: Cataracts Macula Degeneration Acid reflux BPH with lower urinary tract symptoms... Arthritis Heart Problem High Blood Pressure LDL  Review of Systems Ear, Nose, Mouth & Throat hearing loss Musculoskeletal Joint Ache, Stiffness All recorded systems are negative except as noted above.  Social   Never smoked   Medication Prednisolone-gatiflox-bromfenac,  Fluticasone, Furosemide, Ibuprofen, Lisinopril, Metoprolol, Potassium chloride, Ranitidine, Tamsulosin, Aspirin, Loratadine, Glucosamine Sulfate, Multivitamin, Amlodipine Besylate, Diclofenac, Finesteride,   Sx/Procedures Phaco c IOL OS, Removal of lens fragment after cataract surgery,   Drug Allergies   NKDA  History & Physical: Heent:  Cataract, Right eye NECK: supple without bruits LUNGS: lungs clear to auscultation CV: regular rate and rhythm Abdomen: soft and non-tender  Impression & Plan: Assessment: 1.  CATARACT EXTRACTION STATUS; Left Eye (Z98.42) 2.  COMBINED FORMS AGE RELATED CATARACT; Right Eye (H25.811)  Plan: 1.  1 month  after Phaco PCIOL. Doing very well with improved vision and normal IOP. Resolved inflammation OS. Stop Pred Acetate Call with any worsening vision, pain, or any other concerns. 2.  Dilates poorly - shugacaine by protocol. Omidira. Flomax. Cataract accounts for the patient's decreased vision. This visual impairment is not correctable with a tolerable change in glasses or contact lenses. Cataract surgery with an implantation of a new lens should significantly improve the visual and functional status of the patient. Discussed all risks, benefits, alternatives, and potential complications. Discussed the procedures and recovery. Patient desires to have surgery. A-scan ordered and performed today for intra-ocular lens calculations. The surgery will be performed in order to improve vision for driving, reading, and for eye examinations. Recommend phacoemulsification with intra-ocular lens. Right Eye. Malyugin ring. DIB00

## 2020-05-19 DIAGNOSIS — H25811 Combined forms of age-related cataract, right eye: Secondary | ICD-10-CM | POA: Diagnosis not present

## 2020-05-21 ENCOUNTER — Other Ambulatory Visit: Payer: Self-pay

## 2020-05-21 ENCOUNTER — Encounter (HOSPITAL_COMMUNITY)
Admission: RE | Admit: 2020-05-21 | Discharge: 2020-05-21 | Disposition: A | Discharge status: Home / Self Care | Payer: Medicare HMO | Source: Ambulatory Visit | Attending: Ophthalmology | Admitting: Ophthalmology

## 2020-05-22 ENCOUNTER — Other Ambulatory Visit: Payer: Self-pay

## 2020-05-22 ENCOUNTER — Encounter (HOSPITAL_COMMUNITY): Payer: Self-pay

## 2020-05-23 ENCOUNTER — Other Ambulatory Visit (HOSPITAL_COMMUNITY)
Admission: RE | Admit: 2020-05-23 | Discharge: 2020-05-23 | Disposition: A | Discharge status: Home / Self Care | Payer: Medicare HMO | Source: Ambulatory Visit | Attending: Ophthalmology | Admitting: Ophthalmology

## 2020-05-23 DIAGNOSIS — R030 Elevated blood-pressure reading, without diagnosis of hypertension: Secondary | ICD-10-CM | POA: Diagnosis not present

## 2020-05-23 DIAGNOSIS — Z01812 Encounter for preprocedural laboratory examination: Secondary | ICD-10-CM | POA: Insufficient documentation

## 2020-05-23 DIAGNOSIS — H25811 Combined forms of age-related cataract, right eye: Secondary | ICD-10-CM | POA: Diagnosis present

## 2020-05-23 DIAGNOSIS — Z20822 Contact with and (suspected) exposure to covid-19: Secondary | ICD-10-CM | POA: Insufficient documentation

## 2020-05-23 DIAGNOSIS — N400 Enlarged prostate without lower urinary tract symptoms: Secondary | ICD-10-CM | POA: Diagnosis not present

## 2020-05-23 DIAGNOSIS — K219 Gastro-esophageal reflux disease without esophagitis: Secondary | ICD-10-CM | POA: Diagnosis not present

## 2020-05-23 DIAGNOSIS — M1990 Unspecified osteoarthritis, unspecified site: Secondary | ICD-10-CM | POA: Diagnosis not present

## 2020-05-23 LAB — SARS CORONAVIRUS 2 (TAT 6-24 HRS): SARS Coronavirus 2: NEGATIVE

## 2020-05-26 ENCOUNTER — Encounter (HOSPITAL_COMMUNITY)
Admission: RE | Disposition: A | Discharge status: Home / Self Care | Payer: Self-pay | Source: Home / Self Care | Attending: Ophthalmology

## 2020-05-26 ENCOUNTER — Ambulatory Visit (HOSPITAL_COMMUNITY): Payer: Medicare HMO | Admitting: Anesthesiology

## 2020-05-26 ENCOUNTER — Other Ambulatory Visit: Payer: Self-pay

## 2020-05-26 ENCOUNTER — Encounter (HOSPITAL_COMMUNITY): Payer: Self-pay | Admitting: Ophthalmology

## 2020-05-26 ENCOUNTER — Ambulatory Visit (HOSPITAL_COMMUNITY)
Admission: RE | Admit: 2020-05-26 | Discharge: 2020-05-26 | Disposition: A | Discharge status: Home / Self Care | Payer: Medicare HMO | Attending: Ophthalmology | Admitting: Ophthalmology

## 2020-05-26 DIAGNOSIS — R030 Elevated blood-pressure reading, without diagnosis of hypertension: Secondary | ICD-10-CM | POA: Insufficient documentation

## 2020-05-26 DIAGNOSIS — H25811 Combined forms of age-related cataract, right eye: Secondary | ICD-10-CM | POA: Insufficient documentation

## 2020-05-26 DIAGNOSIS — N400 Enlarged prostate without lower urinary tract symptoms: Secondary | ICD-10-CM | POA: Insufficient documentation

## 2020-05-26 DIAGNOSIS — K219 Gastro-esophageal reflux disease without esophagitis: Secondary | ICD-10-CM | POA: Diagnosis not present

## 2020-05-26 DIAGNOSIS — M1990 Unspecified osteoarthritis, unspecified site: Secondary | ICD-10-CM | POA: Diagnosis not present

## 2020-05-26 DIAGNOSIS — I10 Essential (primary) hypertension: Secondary | ICD-10-CM | POA: Diagnosis not present

## 2020-05-26 DIAGNOSIS — I4891 Unspecified atrial fibrillation: Secondary | ICD-10-CM | POA: Diagnosis not present

## 2020-05-26 HISTORY — PX: CATARACT EXTRACTION W/PHACO: SHX586

## 2020-05-26 SURGERY — PHACOEMULSIFICATION, CATARACT, WITH IOL INSERTION
Anesthesia: Monitor Anesthesia Care | Site: Eye | Laterality: Right

## 2020-05-26 MED ORDER — LIDOCAINE HCL (PF) 1 % IJ SOLN
INTRAOCULAR | Status: DC | PRN
  Administered 2020-05-26: 1 mL via OPHTHALMIC

## 2020-05-26 MED ORDER — NEOMYCIN-POLYMYXIN-DEXAMETH 3.5-10000-0.1 OP SUSP
OPHTHALMIC | Status: DC | PRN
  Administered 2020-05-26: 1 [drp] via OPHTHALMIC

## 2020-05-26 MED ORDER — POVIDONE-IODINE 5 % OP SOLN
OPHTHALMIC | Status: DC | PRN
  Administered 2020-05-26: 1 via OPHTHALMIC

## 2020-05-26 MED ORDER — SODIUM HYALURONATE 23 MG/ML IO SOLN
INTRAOCULAR | Status: DC | PRN
  Administered 2020-05-26: 0.6 mL via INTRAOCULAR

## 2020-05-26 MED ORDER — PHENYLEPHRINE HCL 2.5 % OP SOLN
1.0000 [drp] | OPHTHALMIC | Status: AC | PRN
  Administered 2020-05-26 (×3): 1 [drp] via OPHTHALMIC

## 2020-05-26 MED ORDER — PROVISC 10 MG/ML IO SOLN
INTRAOCULAR | Status: DC | PRN
  Administered 2020-05-26: 0.85 mL via INTRAOCULAR

## 2020-05-26 MED ORDER — PHENYLEPHRINE-KETOROLAC 1-0.3 % IO SOLN
INTRAOCULAR | Status: AC
  Filled 2020-05-26: qty 4

## 2020-05-26 MED ORDER — EPINEPHRINE PF 1 MG/ML IJ SOLN
INTRAMUSCULAR | Status: AC
  Filled 2020-05-26: qty 1

## 2020-05-26 MED ORDER — TETRACAINE HCL 0.5 % OP SOLN
1.0000 [drp] | OPHTHALMIC | Status: AC | PRN
  Administered 2020-05-26 (×3): 1 [drp] via OPHTHALMIC

## 2020-05-26 MED ORDER — BSS IO SOLN
INTRAOCULAR | Status: DC | PRN
  Administered 2020-05-26: 15 mL via INTRAOCULAR

## 2020-05-26 MED ORDER — PHENYLEPHRINE-KETOROLAC 1-0.3 % IO SOLN
INTRAOCULAR | Status: DC | PRN
  Administered 2020-05-26: 500 mL via OPHTHALMIC

## 2020-05-26 MED ORDER — LIDOCAINE HCL 3.5 % OP GEL
1.0000 "application " | Freq: Once | OPHTHALMIC | Status: AC
  Administered 2020-05-26: 1 via OPHTHALMIC

## 2020-05-26 MED ORDER — LACTATED RINGERS IV SOLN: INTRAVENOUS | Status: DC

## 2020-05-26 MED ORDER — CYCLOPENTOLATE-PHENYLEPHRINE 0.2-1 % OP SOLN
1.0000 [drp] | OPHTHALMIC | Status: AC | PRN
  Administered 2020-05-26 (×3): 1 [drp] via OPHTHALMIC

## 2020-05-26 SURGICAL SUPPLY — 14 items
CLOTH BEACON ORANGE TIMEOUT ST (SAFETY) ×2 IMPLANT
EYE SHIELD UNIVERSAL CLEAR (GAUZE/BANDAGES/DRESSINGS) ×2 IMPLANT
GLOVE BIOGEL PI IND STRL 6.5 (GLOVE) ×1 IMPLANT
GLOVE BIOGEL PI IND STRL 7.0 (GLOVE) ×1 IMPLANT
GLOVE BIOGEL PI INDICATOR 6.5 (GLOVE) ×1
GLOVE BIOGEL PI INDICATOR 7.0 (GLOVE) ×1
LENS ALC ACRYL/TECN (Ophthalmic Related) ×2 IMPLANT
NEEDLE HYPO 18GX1.5 BLUNT FILL (NEEDLE) ×2 IMPLANT
PAD ARMBOARD 7.5X6 YLW CONV (MISCELLANEOUS) ×2 IMPLANT
SYR TB 1ML LL NO SAFETY (SYRINGE) ×2 IMPLANT
TAPE SURG TRANSPORE 1 IN (GAUZE/BANDAGES/DRESSINGS) ×1 IMPLANT
TAPE SURGICAL TRANSPORE 1 IN (GAUZE/BANDAGES/DRESSINGS) ×1
VISCOELASTIC ADDITIONAL (OPHTHALMIC RELATED) ×2 IMPLANT
WATER STERILE IRR 250ML POUR (IV SOLUTION) ×2 IMPLANT

## 2020-05-26 NOTE — Anesthesia Procedure Notes (Signed)
Procedure Name: MAC Date/Time: 05/26/2020 9:20 AM Performed by: Orlie Dakin, CRNA Pre-anesthesia Checklist: Patient identified, Emergency Drugs available, Suction available and Patient being monitored Patient Re-evaluated:Patient Re-evaluated prior to induction Oxygen Delivery Method: Nasal cannula Placement Confirmation: positive ETCO2

## 2020-05-26 NOTE — Anesthesia Preprocedure Evaluation (Addendum)
Anesthesia Evaluation  Patient identified by MRN, date of birth, ID band Patient awake    Reviewed: Allergy & Precautions, NPO status , Patient's Chart, lab work & pertinent test results, reviewed documented beta blocker date and time   History of Anesthesia Complications Negative for: history of anesthetic complications  Airway Mallampati: III  TM Distance: >3 FB Neck ROM: Full    Dental  (+) Lower Dentures, Upper Dentures   Pulmonary former smoker,    Pulmonary exam normal breath sounds clear to auscultation       Cardiovascular Exercise Tolerance: Good hypertension, Pt. on medications and Pt. on home beta blockers + CAD and + CABG  Normal cardiovascular exam+ dysrhythmias Atrial Fibrillation  Rhythm:Irregular Rate:Normal     Neuro/Psych  Neuromuscular disease    GI/Hepatic Neg liver ROS, GERD  Medicated,  Endo/Other  negative endocrine ROS  Renal/GU Renal disease     Musculoskeletal  (+) Arthritis , Osteoarthritis,    Abdominal   Peds  Hematology   Anesthesia Other Findings   Reproductive/Obstetrics                             Anesthesia Physical  Anesthesia Plan  ASA: III  Anesthesia Plan: MAC   Post-op Pain Management:    Induction:   PONV Risk Score and Plan: 1  Airway Management Planned: Nasal Cannula and Natural Airway  Additional Equipment:   Intra-op Plan:   Post-operative Plan:   Informed Consent: I have reviewed the patients History and Physical, chart, labs and discussed the procedure including the risks, benefits and alternatives for the proposed anesthesia with the patient or authorized representative who has indicated his/her understanding and acceptance.       Plan Discussed with: CRNA and Surgeon  Anesthesia Plan Comments:         Anesthesia Quick Evaluation

## 2020-05-26 NOTE — Interval H&P Note (Signed)
History and Physical Interval Note:  05/26/2020 9:02 AM  Darius Grimes  has presented today for surgery, with the diagnosis of Nuclear sclerotic cataract - Right eye.  The various methods of treatment have been discussed with the patient and family. After consideration of risks, benefits and other options for treatment, the patient has consented to  Procedure(s) with comments: CATARACT EXTRACTION PHACO AND INTRAOCULAR LENS PLACEMENT RIGHT EYE (Right) - right as a surgical intervention.  The patient's history has been reviewed, patient examined, no change in status, stable for surgery.  I have reviewed the patient's chart and labs.  Questions were answered to the patient's satisfaction.     Baruch Goldmann

## 2020-05-26 NOTE — Op Note (Signed)
Date of procedure: 05/26/20  Pre-operative diagnosis:  Visually significant combined form age-related cataract, Right Eye (H25.811)  Post-operative diagnosis:  Visually significant combined form age-related cataract, Right Eye (H25.811)  Procedure: Removal of cataract via phacoemulsification and insertion of intra-ocular lens Wynetta Emery and [employer] DIB00  +16.5D into the capsular bag of the Right Eye  Attending surgeon: Gerda Diss. Sullivan Jacuinde, MD, MA  Anesthesia: MAC, Topical Akten  Complications: None  Estimated Blood Loss: <35m (minimal)  Specimens: None  Implants: As above  Indications:  Visually significant age-related cataract, Right Eye  Procedure:  The patient was seen and identified in the pre-operative area. The operative eye was identified and dilated.  The operative eye was marked.  Topical anesthesia was administered to the operative eye.     The patient was then to the operative suite and placed in the supine position.  A timeout was performed confirming the patient, procedure to be performed, and all other relevant information.   The patient's face was prepped and draped in the usual fashion for intra-ocular surgery.  A lid speculum was placed into the operative eye and the surgical microscope moved into place and focused.  A superotemporal paracentesis was created using a 20 gauge paracentesis blade.  Shugarcaine was injected into the anterior chamber.  Viscoelastic was injected into the anterior chamber.  A temporal clear-corneal main wound incision was created using a 2.433mmicrokeratome.  A continuous curvilinear capsulorrhexis was initiated using an irrigating cystitome and completed using capsulorrhexis forceps.  Hydrodissection and hydrodeliniation were performed.  Viscoelastic was injected into the anterior chamber.  A phacoemulsification handpiece and a chopper as a second instrument were used to remove the nucleus and epinucleus. The irrigation/aspiration handpiece was  used to remove any remaining cortical material.   The capsular bag was reinflated with viscoelastic, checked, and found to be intact.  The intraocular lens was inserted into the capsular bag.  The irrigation/aspiration handpiece was used to remove any remaining viscoelastic.  The clear corneal wound and paracentesis wounds were then hydrated and checked with Weck-Cels to be watertight.  The lid-speculum was removed.  The drape was removed.  The patient's face was cleaned with a wet and dry 4x4.   Maxitrol was instilled in the eye. A clear shield was taped over the eye. The patient was taken to the post-operative care unit in good condition, having tolerated the procedure well.  Post-Op Instructions: [facility] and will receive all other orders and instructions.

## 2020-05-26 NOTE — Anesthesia Postprocedure Evaluation (Signed)
Anesthesia Post Note  Patient: Darius Grimes  Procedure(s) Performed: CATARACT EXTRACTION PHACO AND INTRAOCULAR LENS PLACEMENT RIGHT EYE (Right Eye)  Patient location during evaluation: Short Stay Anesthesia Type: MAC Level of consciousness: awake and alert and oriented Pain management: pain level controlled Vital Signs Assessment: post-procedure vital signs reviewed and stable Respiratory status: spontaneous breathing, respiratory function stable and nonlabored ventilation Cardiovascular status: blood pressure returned to baseline Postop Assessment: no headache and no apparent nausea or vomiting Anesthetic complications: no   No complications documented.   Last Vitals:  Vitals:   05/26/20 0801 05/26/20 0924  BP: 114/64 129/74  Pulse: 63 68  Resp: 20 20  Temp: 36.7 C 37.2 C  SpO2: 98% 98%    Last Pain:  Vitals:   05/26/20 0924  TempSrc: Oral  PainSc: 0-No pain                 Orlie Dakin

## 2020-05-26 NOTE — Discharge Instructions (Signed)
Please discharge patient when stable, will follow up today with Dr. Zalaya Astarita at the [facility] [place] office immediately following discharge.  Leave shield in place until visit.  All paperwork with discharge instructions will be given at the office.  [facility] Address:  [address]  [place], [place] [address]  

## 2020-05-26 NOTE — Transfer of Care (Signed)
Immediate Anesthesia Transfer of Care Note  Patient: Darius Grimes  Procedure(s) Performed: CATARACT EXTRACTION PHACO AND INTRAOCULAR LENS PLACEMENT RIGHT EYE (Right Eye)  Patient Location: Short Stay  Anesthesia Type:MAC  Level of Consciousness: awake, alert  and oriented  Airway & Oxygen Therapy: Patient Spontanous Breathing  Post-op Assessment: Report given to RN and Post -op Vital signs reviewed and stable  Post vital signs: Reviewed and stable  Last Vitals:  Vitals Value Taken Time  BP 129/74 05/26/20 0924  Temp 37.2 C 05/26/20 0924  Pulse 68 05/26/20 0924  Resp 20 05/26/20 0924  SpO2 98 % 05/26/20 0924    Last Pain:  Vitals:   05/26/20 0924  TempSrc: Oral  PainSc: 0-No pain         Complications: No complications documented.

## 2020-05-27 ENCOUNTER — Encounter (HOSPITAL_COMMUNITY): Payer: Self-pay | Admitting: Ophthalmology

## 2020-08-29 ENCOUNTER — Other Ambulatory Visit: Payer: Self-pay | Admitting: Gastroenterology

## 2020-09-08 DIAGNOSIS — M9901 Segmental and somatic dysfunction of cervical region: Secondary | ICD-10-CM | POA: Diagnosis not present

## 2020-09-08 DIAGNOSIS — M722 Plantar fascial fibromatosis: Secondary | ICD-10-CM | POA: Diagnosis not present

## 2020-09-08 DIAGNOSIS — M5412 Radiculopathy, cervical region: Secondary | ICD-10-CM | POA: Diagnosis not present

## 2020-09-08 DIAGNOSIS — M25512 Pain in left shoulder: Secondary | ICD-10-CM | POA: Diagnosis not present

## 2020-09-10 DIAGNOSIS — M9901 Segmental and somatic dysfunction of cervical region: Secondary | ICD-10-CM | POA: Diagnosis not present

## 2020-09-10 DIAGNOSIS — M5412 Radiculopathy, cervical region: Secondary | ICD-10-CM | POA: Diagnosis not present

## 2020-09-10 DIAGNOSIS — M722 Plantar fascial fibromatosis: Secondary | ICD-10-CM | POA: Diagnosis not present

## 2020-09-10 DIAGNOSIS — M25512 Pain in left shoulder: Secondary | ICD-10-CM | POA: Diagnosis not present

## 2020-09-11 ENCOUNTER — Ambulatory Visit (INDEPENDENT_AMBULATORY_CARE_PROVIDER_SITE_OTHER): Payer: Medicare HMO

## 2020-09-11 ENCOUNTER — Ambulatory Visit
Admission: EM | Admit: 2020-09-11 | Discharge: 2020-09-11 | Disposition: A | Discharge status: Home / Self Care | Payer: Medicare HMO | Attending: Family Medicine | Admitting: Family Medicine

## 2020-09-11 DIAGNOSIS — M19072 Primary osteoarthritis, left ankle and foot: Secondary | ICD-10-CM

## 2020-09-11 DIAGNOSIS — M7592 Shoulder lesion, unspecified, left shoulder: Secondary | ICD-10-CM | POA: Diagnosis not present

## 2020-09-11 DIAGNOSIS — M25512 Pain in left shoulder: Secondary | ICD-10-CM

## 2020-09-11 DIAGNOSIS — M7732 Calcaneal spur, left foot: Secondary | ICD-10-CM

## 2020-09-11 DIAGNOSIS — M542 Cervicalgia: Secondary | ICD-10-CM

## 2020-09-11 DIAGNOSIS — M79672 Pain in left foot: Secondary | ICD-10-CM

## 2020-09-11 DIAGNOSIS — M25412 Effusion, left shoulder: Secondary | ICD-10-CM | POA: Diagnosis not present

## 2020-09-11 MED ORDER — DEXAMETHASONE SODIUM PHOSPHATE 10 MG/ML IJ SOLN
10.0000 mg | Freq: Once | INTRAMUSCULAR | Status: AC
  Administered 2020-09-11: 10 mg via INTRAMUSCULAR

## 2020-09-11 MED ORDER — PREDNISONE 10 MG (21) PO TBPK: ORAL_TABLET | Freq: Every day | ORAL | 0 refills | Status: AC

## 2020-09-11 MED ORDER — TIZANIDINE HCL 4 MG PO TABS: 4.0000 mg | ORAL_TABLET | Freq: Four times a day (QID) | ORAL | 0 refills | Status: DC | PRN

## 2020-09-11 MED ORDER — KETOROLAC TROMETHAMINE 30 MG/ML IJ SOLN
30.0000 mg | Freq: Once | INTRAMUSCULAR | Status: AC
  Administered 2020-09-11: 30 mg via INTRAMUSCULAR

## 2020-09-11 NOTE — ED Triage Notes (Signed)
Pt states that a few weeks ago his left shoulder popped out of joint. He thought it popped back in place but it didn't. Pt states that the pain runs down his neck. Pt states that also this past Saturday he started having left foot pain.

## 2020-09-11 NOTE — Discharge Instructions (Signed)
Your x-ray today shows tendinitis around the left shoulder, as well as a bone spur under your left heel.  This explains all of your pain and limitations of movement.  You have received Toradol for pain and a steroid injection in the office today.  I have sent in a prednisone taper for you to take for 6 days. 6 tablets on day one, 5 tablets on day two, 4 tablets on day three, 3 tablets on day four, 2 tablets on day five, and 1 tablet on day six.  I have sent in tizanidine for you to take 1 tablet every 6 hours as needed for muscle spasms  I have also placed a referral for you to see orthopedics.  If pain is persisting follow-up there.

## 2020-09-11 NOTE — ED Provider Notes (Signed)
[place]   [id number] 09/11/20 Arrival Time: 5366  [id number] PAIN  SUBJECTIVE: History from: patient. Darius Grimes is a 75 y.o. male complains of left shoulder pain that began 4 days ago. Reports that he was lifting corn into a feeder and his left shoulder came out of socket. Reports that he replaced the joint back into socket on his own. Reports that he has had pain, swelling and limitation of movement since then. Also reports left heel pain for the last 4 days. Reports history of plantar fasciitis in the right heel, states that this is more painful. Reports pain is worse in both locations with activity. Has not taken any medications for either of these. Denies fever, chills, erythema, ecchymosis, weakness, numbness and tingling, saddle paresthesias, loss of bowel or bladder function.      ROS: As per HPI.  All other pertinent ROS negative.     Past Medical History:  Diagnosis Date  . Acid reflux   . Allergic rhinitis   . Arthritis   . Coronary artery disease   . Dysrhythmia   . Episodic atrial fibrillation ([place])   . GERD (gastroesophageal reflux disease)   . Heart palpitations   . History of kidney stones   . Hyperlipidemia   . Hypertension   . Kidney stones   . Neck pain   . Recurrent nephrolithiasis   . Skin lesion of back    Past Surgical History:  Procedure Laterality Date  . CARPAL TUNNEL RELEASE Left   . CATARACT EXTRACTION W/PHACO Left 02/01/2020   Procedure: CATARACT EXTRACTION PHACO AND INTRAOCULAR LENS PLACEMENT (IOC);  Surgeon: Baruch Goldmann, MD;  Location: [facility];  Service: Ophthalmology;  Laterality: Left;  CDE: 8.89  . CATARACT EXTRACTION W/PHACO Left 02/15/2020   Procedure: REMOVAL OF RETAINED LENS FRAGMENTS LEFT EYE;  Surgeon: Baruch Goldmann, MD;  Location: [facility];  Service: Ophthalmology;  Laterality: Left;  . CATARACT EXTRACTION W/PHACO Right 05/26/2020   Procedure: CATARACT EXTRACTION PHACO AND INTRAOCULAR LENS PLACEMENT RIGHT EYE;  Surgeon:  Baruch Goldmann, MD;  Location: [facility];  Service: Ophthalmology;  Laterality: Right;  CDE: 6.38  . CORONARY ARTERY BYPASS GRAFT    . HYDROCELE EXCISION / REPAIR    . KNEE ARTHROPLASTY Left 07/04/2017   Procedure: COMPUTER ASSISTED TOTAL KNEE ARTHROPLASTY;  Surgeon: Dereck Leep, MD;  Location: [facility];  Service: Orthopedics;  Laterality: Left;  . THROAT SURGERY    . URETERAL STENT PLACEMENT     at the [place]  . Vocal cord poylp removal     No Known Allergies No current facility-administered medications on file prior to encounter.   Current Outpatient Medications on File Prior to Encounter  Medication Sig Dispense Refill  . acetaminophen (TYLENOL) 650 MG CR tablet Take 650 mg by mouth every 8 (eight) hours as needed for pain.    [name] albuterol (VENTOLIN HFA) 108 (90 Base) MCG/ACT inhaler Inhale 1-2 puffs into the lungs every 6 (six) hours as needed for wheezing or shortness of breath.     [name] amLODipine (NORVASC) 10 MG tablet Take 10 mg by mouth every evening.    [name] aspirin EC 81 MG tablet Take 81 mg by mouth daily. Swallow whole.    . docusate sodium (COLACE) 100 MG capsule Take 200 mg by mouth every other day.    . finasteride (PROSCAR) 5 MG tablet Take 5 mg by mouth daily.    . fluticasone (FLONASE) 50 MCG/ACT nasal spray Place 2 sprays into both nostrils  daily as needed for rhinitis.     [name] lisinopril (PRINIVIL,ZESTRIL) 40 MG tablet Take 40 mg by mouth daily.    [name] LORazepam (ATIVAN) 0.5 MG tablet Take 0.5 mg by mouth at bedtime as needed for sleep.    . meloxicam (MOBIC) 15 MG tablet Take 15 mg by mouth daily.    . metoprolol tartrate (LOPRESSOR) 50 MG tablet Take 50 mg by mouth 2 (two) times daily.    . Multiple Vitamin (MULTIVITAMIN WITH MINERALS) TABS tablet Take 1 tablet by mouth daily.    . Omega-3 Fatty Acids (FISH OIL) 1200 MG CAPS Take 2 capsules (2,400 mg total) by mouth in the morning and at bedtime. 60 capsule 0  . Potassium Chloride ER 20 MEQ TBCR Take 40 mEq by mouth 2 (two) times  daily.     . Psyllium (METAMUCIL FIBER PO) Take 1 Dose by mouth See admin instructions. Once to twice daily for regularity    . rosuvastatin (CRESTOR) 10 MG tablet Take 5 mg by mouth at bedtime.     . tamsulosin (FLOMAX) 0.4 MG CAPS capsule Take 0.4 mg by mouth daily.     [name] torsemide (DEMADEX) 20 MG tablet Take 40 mg by mouth in the morning and at bedtime.    . vitamin B-12 (CYANOCOBALAMIN) 1000 MCG tablet Take 2,000 mcg by mouth daily.     Social History   Socioeconomic History  . Marital status: Married    Spouse name: [name]  . Number of children: 2  . Years of education: Not on file  . Highest education level: 12th grade  Occupational History    Employer: [employer]  Tobacco Use  . Smoking status: Former Smoker    Packs/day: 1.00    Years: 7.00    Pack years: 7.00    Types: Cigarettes    Quit date: 08/31/1983    Years since quitting: 37.0  . Smokeless tobacco: Never Used  . Tobacco comment: smoking cessation materials not required  Vaping Use  . Vaping Use: Never used  Substance and Sexual Activity  . Alcohol use: No    Alcohol/week: 0.0 standard drinks  . Drug use: No  . Sexual activity: Yes    Partners: Female  Other Topics Concern  . 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    Social Determinants of Health   Financial Resource Strain: Not on file  Food Insecurity: Not on file  Transportation Needs: Not on file  Physical Activity: Not on file  Stress: Not on file  Social Connections: Not on file  Intimate Partner Violence: Not on file   Family History  Problem Relation Age of Onset  . CVA Mother   . Hypotension Mother   . CAD Father   . Heart attack Father   . Hypertension Sister   . Hypertension Brother   . Obesity Brother     OBJECTIVE:  Vitals:   09/11/20 1240 09/11/20 1241  BP:  139/79  Pulse:  67  Temp:  97.9 F  (36.6 C)  TempSrc:  Oral  SpO2:  97%  Weight: 270 lb (122.5 kg)   Height: 5\' 10"  (1.778 m)     General appearance: ALERT; in no acute distress.  Head: NCAT Lungs: Normal respiratory effort CV: pulses 2+ bilaterally. Cap refill < 2 seconds Musculoskeletal:  Inspection:  Skin warm, dry, clear and intact. Left lower extremity swelling. Swelling at L anterior aspect of the shoulder Palpation: Anterior aspect of L shoulder and sole of L foot at calcaneous tender to palpation ROM: limited ROM active and passive to L shoulder and L foot Skin: warm and dry Neurologic: Ambulates without difficulty; Sensation intact about the upper/ lower extremities Psychological: alert and cooperative; normal mood and affect  DIAGNOSTIC STUDIES:  No results found.   ASSESSMENT & PLAN:  1. Left supraspinatus tendinitis   2. Acute pain of left shoulder   3. Pain of left heel   4. Calcaneal spur of foot, left   5. Primary osteoarthritis of left foot     Meds ordered this encounter  Medications  . tiZANidine (ZANAFLEX) 4 MG tablet    Sig: Take 1 tablet (4 mg total) by mouth every 6 (six) hours as needed for muscle spasms.    Dispense:  30 tablet    Refill:  0    Order Specific Question:   Supervising Provider    Answer:   Chase Picket [id number]  . predniSONE (STERAPRED UNI-PAK 21 TAB) 10 MG (21) TBPK tablet    Sig: Take by mouth daily for 6 days. Take 6 tablets on day 1, 5 tablets on day 2, 4 tablets on day 3, 3 tablets on day 4, 2 tablets on day 5, 1 tablet on day 6    Dispense:  21 tablet    Refill:  0    Order Specific Question:   Supervising Provider    Answer:   Chase Picket [id number]  . ketorolac (TORADOL) 30 MG/ML injection 30 mg  . dexamethasone (DECADRON) injection 10 mg   Left foot xray shows:  1) No acute osseous abnormality  2) Severe osteoarthritis to the joints of the midfoot and medial cuneiform of the first metatarsal joint  3) Marginal erosion involving the lateral  aspect of the head of the first metatarsal with overlying soft tissue swelling 4) Small enthesopathic spur at the insertion of the Achilles tendon on the posterior calcaneous 5) Small plantar calcaneal spur  Left shoulder xray shows: 1) No acute osseous abnormality 2) Moderate degenerative changes involving the acromioclavicular joint 3) Narrowed acromiohumeral space which may indicate chronic supraspinatus tendon disease  Toradol 30mg  IM in office today Decadron 10mg  IM in offie today Steroid taper prescribed Tizanidine prescribed Sedation precautions given Placed ambulatory referral to [place], follow up there The left shoulder may need surgical repair  Continue conservative management of rest, ice, and gentle stretches Take ibuprofen and tylenol as needed for pain relief (may cause abdominal discomfort, ulcers, and GI bleeds avoid taking with other NSAIDs) Take tizanidine at nighttime for symptomatic relief. Avoid driving or operating heavy machinery while using medication. Follow up with PCP if symptoms persist Return or go to the ER if you have any new or worsening symptoms (fever, chills, chest pain, abdominal pain, changes in bowel or bladder habits, pain radiating into lower legs)   Reviewed expectations re: course of current medical issues. Questions answered. Outlined signs and symptoms indicating need for more acute intervention. Patient verbalized understanding. After Visit Summary given.       Faustino Congress, NP 09/13/20 1104

## 2020-10-08 ENCOUNTER — Encounter: Payer: Self-pay | Admitting: Orthopaedic Surgery

## 2020-10-14 ENCOUNTER — Other Ambulatory Visit: Payer: Self-pay

## 2020-10-14 ENCOUNTER — Ambulatory Visit: Payer: Medicare HMO | Admitting: Orthopaedic Surgery

## 2020-10-14 ENCOUNTER — Encounter: Payer: Self-pay | Admitting: Orthopaedic Surgery

## 2020-10-14 VITALS — BP 146/92 | HR 79 | Ht 70.0 in | Wt 265.0 lb

## 2020-10-14 DIAGNOSIS — M25512 Pain in left shoulder: Secondary | ICD-10-CM

## 2020-10-14 DIAGNOSIS — G8929 Other chronic pain: Secondary | ICD-10-CM | POA: Diagnosis not present

## 2020-10-14 DIAGNOSIS — M79672 Pain in left foot: Secondary | ICD-10-CM | POA: Diagnosis not present

## 2020-10-14 DIAGNOSIS — M25511 Pain in right shoulder: Secondary | ICD-10-CM

## 2020-10-14 NOTE — Progress Notes (Signed)
Subjective:    Patient ID: Darius Grimes, male    DOB: 20-Aug-1946, 75 y.o.   MRN: [id number]  HPI He hurt his left shoulder unloading corn and placing in feeder on 09-07-20.  He had pain and then began having inability to full abduct his arm.  He thought it would be better.  He used ice and Advil but the pain and lack of motion continued.  He went to Urgent Care on 09-11-20.  I have reviewed the notes and the X-rays.  X-rays showed narrowing from humeral head to [facility] joint suggestive of rotator cuff injury.    He has continued to have left shoulder pain.  He has pain rolling on it at night.  He is taking Mobic and that has not helped.  He has no numbness, no swelling.    He also has left heel pain and has spurs on the left foot.  He has no trauma to this.   Review of Systems  Constitutional: Positive for activity change.  Cardiovascular: Positive for palpitations.  Musculoskeletal: Positive for arthralgias, gait problem, joint swelling, myalgias and neck pain.  Allergic/Immunologic: Positive for environmental allergies.  All other systems reviewed and are negative.  For Review of Systems, all other systems reviewed and are negative.  The following is a summary of the past history medically, past history surgically, known current medicines, social history and family history.  This information is gathered electronically by the computer from prior information and documentation.  I review this each visit and have found including this information at this point in the chart is beneficial and informative.   Past Medical History:  Diagnosis Date  . Acid reflux   . Allergic rhinitis   . Arthritis   . Coronary artery disease   . Dysrhythmia   . Episodic atrial fibrillation ([place])   . GERD (gastroesophageal reflux disease)   . Heart palpitations   . History of kidney stones   . Hyperlipidemia   . Hypertension   . Kidney stones   . Neck pain   . Recurrent nephrolithiasis   . Skin lesion of  back     Past Surgical History:  Procedure Laterality Date  . CARPAL TUNNEL RELEASE Left   . CATARACT EXTRACTION W/PHACO Left 02/01/2020   Procedure: CATARACT EXTRACTION PHACO AND INTRAOCULAR LENS PLACEMENT (IOC);  Surgeon: Baruch Goldmann, MD;  Location: [facility];  Service: Ophthalmology;  Laterality: Left;  CDE: 8.89  . CATARACT EXTRACTION W/PHACO Left 02/15/2020   Procedure: REMOVAL OF RETAINED LENS FRAGMENTS LEFT EYE;  Surgeon: Baruch Goldmann, MD;  Location: [facility];  Service: Ophthalmology;  Laterality: Left;  . CATARACT EXTRACTION W/PHACO Right 05/26/2020   Procedure: CATARACT EXTRACTION PHACO AND INTRAOCULAR LENS PLACEMENT RIGHT EYE;  Surgeon: Baruch Goldmann, MD;  Location: [facility];  Service: Ophthalmology;  Laterality: Right;  CDE: 6.38  . CORONARY ARTERY BYPASS GRAFT    . HYDROCELE EXCISION / REPAIR    . KNEE ARTHROPLASTY Left 07/04/2017   Procedure: COMPUTER ASSISTED TOTAL KNEE ARTHROPLASTY;  Surgeon: Dereck Leep, MD;  Location: [facility];  Service: Orthopedics;  Laterality: Left;  . THROAT SURGERY    . URETERAL STENT PLACEMENT     at the [place]  . Vocal cord poylp removal      Current Outpatient Medications on File Prior to Visit  Medication Sig Dispense Refill  . acetaminophen (TYLENOL) 650 MG CR tablet Take 650 mg by mouth every 8 (eight) hours as needed for pain.    [name]  albuterol (VENTOLIN HFA) 108 (90 Base) MCG/ACT inhaler Inhale 1-2 puffs into the lungs every 6 (six) hours as needed for wheezing or shortness of breath.     [name] amLODipine (NORVASC) 10 MG tablet Take 10 mg by mouth every evening.    [name] aspirin EC 81 MG tablet Take 81 mg by mouth daily. Swallow whole.    . docusate sodium (COLACE) 100 MG capsule Take 200 mg by mouth every other day.    . finasteride (PROSCAR) 5 MG tablet Take 5 mg by mouth daily.    . fluticasone (FLONASE) 50 MCG/ACT nasal spray Place 2 sprays into both nostrils daily as needed for rhinitis.     [name] lisinopril (PRINIVIL,ZESTRIL) 40 MG tablet Take 40 mg by  mouth daily.    [name] LORazepam (ATIVAN) 0.5 MG tablet Take 0.5 mg by mouth at bedtime as needed for sleep.    . meloxicam (MOBIC) 15 MG tablet Take 15 mg by mouth daily.    . metoprolol tartrate (LOPRESSOR) 50 MG tablet Take 50 mg by mouth 2 (two) times daily.    . Multiple Vitamin (MULTIVITAMIN WITH MINERALS) TABS tablet Take 1 tablet by mouth daily.    . Omega-3 Fatty Acids (FISH OIL) 1200 MG CAPS Take 2 capsules (2,400 mg total) by mouth in the morning and at bedtime. 60 capsule 0  . Potassium Chloride ER 20 MEQ TBCR Take 40 mEq by mouth 2 (two) times daily.     . Psyllium (METAMUCIL FIBER PO) Take 1 Dose by mouth See admin instructions. Once to twice daily for regularity    . rosuvastatin (CRESTOR) 10 MG tablet Take 5 mg by mouth at bedtime.     . tamsulosin (FLOMAX) 0.4 MG CAPS capsule Take 0.4 mg by mouth daily.     [name] tiZANidine (ZANAFLEX) 4 MG tablet Take 1 tablet (4 mg total) by mouth every 6 (six) hours as needed for muscle spasms. 30 tablet 0  . torsemide (DEMADEX) 20 MG tablet Take 40 mg by mouth in the morning and at bedtime.    . vitamin B-12 (CYANOCOBALAMIN) 1000 MCG tablet Take 2,000 mcg by mouth daily.     No current facility-administered medications on file prior to visit.    Social History   Socioeconomic History  . Marital status: Married    Spouse name: [name]  . Number of children: 2  . Years of education: Not on file  . Highest education level: 12th grade  Occupational History    Employer: [employer]  Tobacco Use  . Smoking status: Former Smoker    Packs/day: 1.00    Years: 7.00    Pack years: 7.00    Types: Cigarettes    Quit date: 08/31/1983    Years since quitting: 37.1  . Smokeless tobacco: Never Used  . Tobacco comment: smoking cessation materials not required  Vaping Use  . Vaping Use: Never used  Substance and Sexual Activity  . Alcohol use: No    Alcohol/week: 0.0 standard drinks  . Drug use: No  . Sexual activity: Yes    Partners: Female  Other  Topics Concern  . 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    Social Determinants of Health   Financial Resource Strain: Not on file  Food Insecurity: Not on file  Transportation Needs: Not on file  Physical Activity: Not on file  Stress: Not on file  Social Connections: Not on file  Intimate Partner Violence: Not on file    Family History  Problem Relation Age of Onset  . CVA Mother   . Hypotension Mother   . CAD Father   . Heart attack Father   . Hypertension Sister   . Hypertension Brother   . Obesity Brother     BP (!) 146/92   Pulse 79   Ht 5\' 10"  (1.778 m)   Wt 265 lb (120.2 kg)   BMI 38.02 kg/m   Body mass index is 38.02 kg/m.     Objective:   Physical Exam Vitals and nursing note reviewed. Exam conducted with a chaperone present.  Constitutional:      Appearance: He is well-developed and well-nourished.  HENT:     Head: Normocephalic and atraumatic.  Eyes:     Extraocular Movements: EOM normal.     Conjunctiva/sclera: Conjunctivae normal.     Pupils: Pupils are equal, round, and reactive to light.  Cardiovascular:     Rate and Rhythm: Normal rate and regular rhythm.     Pulses: Intact distal pulses.  Pulmonary:     Effort: Pulmonary effort is normal.  Abdominal:     Palpations: Abdomen is soft.  Musculoskeletal:       Arms:     Cervical back: Normal range of motion and neck supple.       Feet:  Skin:    General: Skin is warm and dry.  Neurological:     Mental Status: He is alert and oriented to person, place, and time.     Cranial Nerves: No cranial nerve deficit.     Motor: No abnormal muscle tone.     Coordination: Coordination normal.     Deep Tendon Reflexes: Reflexes are normal and symmetric. Reflexes normal.  Psychiatric:        Mood and Affect: Mood and affect normal.        Behavior: Behavior normal.         Thought Content: Thought content normal.        Judgment: Judgment normal.           Assessment & Plan:   Encounter Diagnoses  Name Primary?  . Acute pain of right shoulder Yes  . Chronic heel pain, left    I have explained about stretching exercises for the heel on the left.  I am concerned about acute rotator cuff tear on the left.  I will get MRI.  PROCEDURE NOTE:  The patient request injection, verbal consent was obtained.  The left shoulder was prepped appropriately after time out was performed.   Sterile technique was observed and injection of 1 cc of Celestone 6 mg with several cc's of plain xylocaine. Anesthesia was provided by ethyl chloride and a 20-gauge needle was used to inject the shoulder area. A posterior approach was used.  The injection was tolerated well.  A band aid dressing was applied.  The patient was advised to apply ice later today and tomorrow to the injection sight as needed.  Return in two week after MRI. Continue the Mobic.  Call if any problem.  Precautions discussed.   Electronically Signed Sanjuana Kava, MD 2/15/20229:56 AM

## 2020-10-24 ENCOUNTER — Ambulatory Visit (HOSPITAL_COMMUNITY)
Admission: RE | Admit: 2020-10-24 | Discharge: 2020-10-24 | Disposition: A | Discharge status: Home / Self Care | Payer: Medicare HMO | Source: Ambulatory Visit | Attending: Orthopaedic Surgery | Admitting: Orthopaedic Surgery

## 2020-10-24 ENCOUNTER — Other Ambulatory Visit: Payer: Self-pay

## 2020-10-24 DIAGNOSIS — M25511 Pain in right shoulder: Secondary | ICD-10-CM | POA: Diagnosis not present

## 2020-10-24 DIAGNOSIS — M19012 Primary osteoarthritis, left shoulder: Secondary | ICD-10-CM | POA: Diagnosis not present

## 2020-10-24 DIAGNOSIS — S46112A Strain of muscle, fascia and tendon of long head of biceps, left arm, initial encounter: Secondary | ICD-10-CM | POA: Diagnosis not present

## 2020-11-10 DIAGNOSIS — E781 Pure hyperglyceridemia: Secondary | ICD-10-CM | POA: Diagnosis not present

## 2020-11-10 DIAGNOSIS — N400 Enlarged prostate without lower urinary tract symptoms: Secondary | ICD-10-CM | POA: Diagnosis not present

## 2020-11-10 DIAGNOSIS — R7301 Impaired fasting glucose: Secondary | ICD-10-CM | POA: Diagnosis not present

## 2020-11-10 DIAGNOSIS — E559 Vitamin D deficiency, unspecified: Secondary | ICD-10-CM | POA: Diagnosis not present

## 2020-11-10 DIAGNOSIS — Z0001 Encounter for general adult medical examination with abnormal findings: Secondary | ICD-10-CM | POA: Diagnosis not present

## 2020-11-10 DIAGNOSIS — R609 Edema, unspecified: Secondary | ICD-10-CM | POA: Diagnosis not present

## 2020-11-10 DIAGNOSIS — R7303 Prediabetes: Secondary | ICD-10-CM | POA: Diagnosis not present

## 2020-11-10 DIAGNOSIS — E538 Deficiency of other specified B group vitamins: Secondary | ICD-10-CM | POA: Diagnosis not present

## 2020-11-11 ENCOUNTER — Encounter: Payer: Self-pay | Admitting: Orthopaedic Surgery

## 2020-11-11 ENCOUNTER — Other Ambulatory Visit: Payer: Self-pay

## 2020-11-11 ENCOUNTER — Ambulatory Visit: Payer: Medicare HMO | Admitting: Orthopaedic Surgery

## 2020-11-11 VITALS — BP 132/84 | HR 79 | Ht 70.0 in | Wt 265.0 lb

## 2020-11-11 DIAGNOSIS — S46112A Strain of muscle, fascia and tendon of long head of biceps, left arm, initial encounter: Secondary | ICD-10-CM | POA: Diagnosis not present

## 2020-11-11 DIAGNOSIS — M170 Bilateral primary osteoarthritis of knee: Secondary | ICD-10-CM | POA: Insufficient documentation

## 2020-11-11 DIAGNOSIS — M25511 Pain in right shoulder: Secondary | ICD-10-CM

## 2020-11-11 DIAGNOSIS — S46119A Strain of muscle, fascia and tendon of long head of biceps, unspecified arm, initial encounter: Secondary | ICD-10-CM

## 2020-11-11 DIAGNOSIS — M79672 Pain in left foot: Secondary | ICD-10-CM

## 2020-11-11 DIAGNOSIS — G8929 Other chronic pain: Secondary | ICD-10-CM

## 2020-11-11 LAB — URIC ACID: Uric Acid, Serum: 8 mg/dL (ref 4.0–8.0)

## 2020-11-11 MED ORDER — HYDROCODONE-ACETAMINOPHEN 5-325 MG PO TABS: ORAL_TABLET | ORAL | 0 refills | Status: DC

## 2020-11-11 NOTE — Progress Notes (Signed)
Patient Darius Grimes, male DOB:12-19-1945, 74 y.o. WUJ:[id number]  Chief Complaint  Patient presents with  . Shoulder Pain    Left review MRI     HPI  Darius Grimes is a 75 y.o. male who has left shoulder pain.  He had MRI which showed: IMPRESSION: Complete tear of the long head of biceps from the superior labrum.  Rotator cuff tendinopathy without tear.  Moderate to moderately severe acromioclavicular osteoarthritis.  I have explained the findings to him.  I told him I could have him see Dr. Amedeo Kinsman but he said he wants to wait.  He also has marked pain in the left heel area. He has tried exercises, ice, Mobic, ibuprofen, steroid pack and still has pain.  He has had X-rays elsewhere.  He is not getting any better.  It hurts to walk on it, it hurts to stand.  He has no trauma, no redness.  I have independently reviewed and interpreted x-rays of this patient done at another site by another physician or qualified health professional.     Body mass index is 38.02 kg/m.  ROS  Review of Systems  All other systems reviewed and are negative.  The following is a summary of the past history medically, past history surgically, known current medicines, social history and family history.  This information is gathered electronically by the computer from prior information and documentation.  I review this each visit and have found including this information at this point in the chart is beneficial and informative.    Past Medical History:  Diagnosis Date  . Acid reflux   . Allergic rhinitis   . Arthritis   . Coronary artery disease   . Dysrhythmia   . Episodic atrial fibrillation ([place])   . GERD (gastroesophageal reflux disease)   . Heart palpitations   . History of kidney stones   . Hyperlipidemia   . Hypertension   . Kidney stones   . Neck pain   . Recurrent nephrolithiasis   . Skin lesion of back     Past Surgical History:  Procedure Laterality Date  . CARPAL  TUNNEL RELEASE Left   . CATARACT EXTRACTION W/PHACO Left 02/01/2020   Procedure: CATARACT EXTRACTION PHACO AND INTRAOCULAR LENS PLACEMENT (IOC);  Surgeon: Baruch Goldmann, MD;  Location: [facility];  Service: Ophthalmology;  Laterality: Left;  CDE: 8.89  . CATARACT EXTRACTION W/PHACO Left 02/15/2020   Procedure: REMOVAL OF RETAINED LENS FRAGMENTS LEFT EYE;  Surgeon: Baruch Goldmann, MD;  Location: [facility];  Service: Ophthalmology;  Laterality: Left;  . CATARACT EXTRACTION W/PHACO Right 05/26/2020   Procedure: CATARACT EXTRACTION PHACO AND INTRAOCULAR LENS PLACEMENT RIGHT EYE;  Surgeon: Baruch Goldmann, MD;  Location: [facility];  Service: Ophthalmology;  Laterality: Right;  CDE: 6.38  . CORONARY ARTERY BYPASS GRAFT    . HYDROCELE EXCISION / REPAIR    . KNEE ARTHROPLASTY Left 07/04/2017   Procedure: COMPUTER ASSISTED TOTAL KNEE ARTHROPLASTY;  Surgeon: Dereck Leep, MD;  Location: [facility];  Service: Orthopedics;  Laterality: Left;  . THROAT SURGERY    . URETERAL STENT PLACEMENT     at the [place]  . Vocal cord poylp removal      Family History  Problem Relation Age of Onset  . CVA Mother   . Hypotension Mother   . CAD Father   . Heart attack Father   . Hypertension Sister   . Hypertension Brother   . Obesity Brother     Social History Social History  Tobacco Use  . Smoking status: Former Smoker    Packs/day: 1.00    Years: 7.00    Pack years: 7.00    Types: Cigarettes    Quit date: 08/31/1983    Years since quitting: 37.2  . Smokeless tobacco: Never Used  . Tobacco comment: smoking cessation materials not required  Vaping Use  . Vaping Use: Never used  Substance Use Topics  . Alcohol use: No    Alcohol/week: 0.0 standard drinks  . Drug use: No    Allergies  Allergen Reactions  . Alfuzosin Other (See Comments)  . Pravastatin Other (See Comments)  . Terazosin Other (See Comments)    Current Outpatient Medications  Medication Sig Dispense Refill  . acetaminophen (TYLENOL) 650 MG CR  tablet Take 650 mg by mouth every 8 (eight) hours as needed for pain.    [name] albuterol (VENTOLIN HFA) 108 (90 Base) MCG/ACT inhaler Inhale 1-2 puffs into the lungs every 6 (six) hours as needed for wheezing or shortness of breath.     [name] amLODipine (NORVASC) 10 MG tablet Take 10 mg by mouth every evening.    [name] aspirin EC 81 MG tablet Take 81 mg by mouth daily. Swallow whole.    . docusate sodium (COLACE) 100 MG capsule Take 200 mg by mouth every other day.    . finasteride (PROSCAR) 5 MG tablet Take 5 mg by mouth daily.    . fluticasone (FLONASE) 50 MCG/ACT nasal spray Place 2 sprays into both nostrils daily as needed for rhinitis.     [name] HYDROcodone-acetaminophen (NORCO/VICODIN) 5-325 MG tablet One tablet every four hours as needed for acute pain.  Limit of five days per [place] statue. 30 tablet 0  . lisinopril (PRINIVIL,ZESTRIL) 40 MG tablet Take 40 mg by mouth daily.    [name] LORazepam (ATIVAN) 0.5 MG tablet Take 0.5 mg by mouth at bedtime as needed for sleep.    . meloxicam (MOBIC) 15 MG tablet Take 15 mg by mouth daily.    . metoprolol tartrate (LOPRESSOR) 50 MG tablet Take 50 mg by mouth 2 (two) times daily.    . Multiple Vitamin (MULTIVITAMIN WITH MINERALS) TABS tablet Take 1 tablet by mouth daily.    . Omega-3 Fatty Acids (FISH OIL) 1200 MG CAPS Take 2 capsules (2,400 mg total) by mouth in the morning and at bedtime. 60 capsule 0  . Potassium Chloride ER 20 MEQ TBCR Take 40 mEq by mouth 2 (two) times daily.     . Psyllium (METAMUCIL FIBER PO) Take 1 Dose by mouth See admin instructions. Once to twice daily for regularity    . rosuvastatin (CRESTOR) 10 MG tablet Take 5 mg by mouth at bedtime.     . tamsulosin (FLOMAX) 0.4 MG CAPS capsule Take 0.4 mg by mouth daily.     [name] tiZANidine (ZANAFLEX) 4 MG tablet Take 1 tablet (4 mg total) by mouth every 6 (six) hours as needed for muscle spasms. 30 tablet 0  . torsemide (DEMADEX) 20 MG tablet Take 40 mg by mouth in the morning and at bedtime.    .  vitamin B-12 (CYANOCOBALAMIN) 1000 MCG tablet Take 2,000 mcg by mouth daily.     No current facility-administered medications for this visit.     Physical Exam  Blood pressure 132/84, pulse 79, height 5\' 10"  (1.778 m), weight 265 lb (120.2 kg).  Constitutional: overall normal hygiene, normal nutrition, well developed, normal grooming, normal body habitus. Assistive device:none  Musculoskeletal: gait and station  Limp left, muscle tone and strength are normal, no tremors or atrophy is present.  .  Neurological: coordination overall normal.  Deep tendon reflex/nerve stretch intact.  Sensation normal.  Cranial nerves II-XII intact.   Skin:   Normal overall no scars, lesions, ulcers or rashes. No psoriasis.  Psychiatric: Alert and oriented x 3.  Recent memory intact, remote memory unclear.  Normal mood and affect. Well groomed.  Good eye contact.  Cardiovascular: overall no swelling, no varicosities, no edema bilaterally, normal temperatures of the legs and arms, no clubbing, cyanosis and good capillary refill.  Lymphatic: palpation is normal.  Left shoulder is tender and has decreased motion.  NV intact.  His left heel is tender, he has no swelling, no redness.  He has a limp.  All other systems reviewed and are negative   The patient has been educated about the nature of the problem(s) and counseled on treatment options.  The patient appeared to understand what I have discussed and is in agreement with it.  Encounter Diagnoses  Name Primary?  . Chronic heel pain, left Yes  . Acute pain of right shoulder   . Labral tear of long head of biceps tendon, initial encounter   Procedure note: After permission from the patient the left heel was prepped.  I injected by sterile technique 1% Xylocaine and 1 cc Celestone 6mg m into the left heel area of point tenderness tolerated well.  His X-rays suggest possible gout of the left foot.  His father and brother had gout.  I will get serum uric  acid done.  PLAN Call if any problems.  Precautions discussed.  Continue current medications.   Return to clinic 1 week   I have reviewed the [place] web site prior to prescribing narcotic medicine for this patient.   Get serum uric acid.  Electronically Signed Sanjuana Kava, MD 3/15/20228:33 AM

## 2020-11-17 DIAGNOSIS — Z96652 Presence of left artificial knee joint: Secondary | ICD-10-CM | POA: Diagnosis not present

## 2020-11-17 DIAGNOSIS — M170 Bilateral primary osteoarthritis of knee: Secondary | ICD-10-CM | POA: Diagnosis not present

## 2020-11-17 DIAGNOSIS — I480 Paroxysmal atrial fibrillation: Secondary | ICD-10-CM | POA: Diagnosis not present

## 2020-11-17 DIAGNOSIS — I2510 Atherosclerotic heart disease of native coronary artery without angina pectoris: Secondary | ICD-10-CM | POA: Diagnosis not present

## 2020-11-17 DIAGNOSIS — I10 Essential (primary) hypertension: Secondary | ICD-10-CM | POA: Diagnosis not present

## 2020-11-17 DIAGNOSIS — N400 Enlarged prostate without lower urinary tract symptoms: Secondary | ICD-10-CM | POA: Diagnosis not present

## 2020-11-17 DIAGNOSIS — K219 Gastro-esophageal reflux disease without esophagitis: Secondary | ICD-10-CM | POA: Diagnosis not present

## 2020-11-17 DIAGNOSIS — E782 Mixed hyperlipidemia: Secondary | ICD-10-CM | POA: Diagnosis not present

## 2020-11-17 DIAGNOSIS — E559 Vitamin D deficiency, unspecified: Secondary | ICD-10-CM | POA: Diagnosis not present

## 2020-11-18 ENCOUNTER — Encounter: Payer: Self-pay | Admitting: Orthopaedic Surgery

## 2020-11-18 ENCOUNTER — Ambulatory Visit: Payer: Medicare HMO | Admitting: Orthopaedic Surgery

## 2020-11-20 ENCOUNTER — Telehealth: Payer: Self-pay

## 2020-11-20 NOTE — Telephone Encounter (Signed)
I called and left message on patient and his wife's phone to call the office. This is in regards to his labs coming back positive for gout.

## 2020-11-24 NOTE — Telephone Encounter (Signed)
I need name of the pharmacy.

## 2020-11-25 ENCOUNTER — Encounter: Payer: Self-pay | Admitting: Orthopaedic Surgery

## 2020-11-25 ENCOUNTER — Ambulatory Visit (INDEPENDENT_AMBULATORY_CARE_PROVIDER_SITE_OTHER): Payer: Medicare HMO | Admitting: Orthopaedic Surgery

## 2020-11-25 ENCOUNTER — Other Ambulatory Visit: Payer: Self-pay

## 2020-11-25 VITALS — BP 148/127 | HR 84 | Ht 70.0 in | Wt 265.0 lb

## 2020-11-25 DIAGNOSIS — M1A0720 Idiopathic chronic gout, left ankle and foot, without tophus (tophi): Secondary | ICD-10-CM | POA: Diagnosis not present

## 2020-11-25 MED ORDER — ALLOPURINOL 300 MG PO TABS: 300.0000 mg | ORAL_TABLET | Freq: Every day | ORAL | 5 refills | Status: AC

## 2020-11-25 NOTE — Progress Notes (Signed)
Patient Darius Grimes, male DOB:04-02-1946, 75 y.o. LNL:[id number]  Chief Complaint  Patient presents with  . Foot Pain    Left heel / better   . Shoulder Pain    Left/ feels better     HPI  Darius Grimes is a 75 y.o. male who has left foot and shoulder pain.  I was concerned he has gout.  I got uric acid level which is 8.0, upper limits of normal.  I have explained gout to him.  I will begin allopurinol daily.  He understands.   Body mass index is 38.02 kg/m.  ROS  Review of Systems  Constitutional: Positive for activity change.  Cardiovascular: Positive for palpitations.  Musculoskeletal: Positive for arthralgias, gait problem, joint swelling, myalgias and neck pain.  Allergic/Immunologic: Positive for environmental allergies.  All other systems reviewed and are negative.   All other systems reviewed and are negative.  The following is a summary of the past history medically, past history surgically, known current medicines, social history and family history.  This information is gathered electronically by the computer from prior information and documentation.  I review this each visit and have found including this information at this point in the chart is beneficial and informative.    Past Medical History:  Diagnosis Date  . Acid reflux   . Allergic rhinitis   . Arthritis   . Coronary artery disease   . Dysrhythmia   . Episodic atrial fibrillation ([place])   . GERD (gastroesophageal reflux disease)   . Heart palpitations   . History of kidney stones   . Hyperlipidemia   . Hypertension   . Kidney stones   . Neck pain   . Recurrent nephrolithiasis   . Skin lesion of back     Past Surgical History:  Procedure Laterality Date  . CARPAL TUNNEL RELEASE Left   . CATARACT EXTRACTION W/PHACO Left 02/01/2020   Procedure: CATARACT EXTRACTION PHACO AND INTRAOCULAR LENS PLACEMENT (IOC);  Surgeon: Baruch Goldmann, MD;  Location: [facility];  Service: Ophthalmology;   Laterality: Left;  CDE: 8.89  . CATARACT EXTRACTION W/PHACO Left 02/15/2020   Procedure: REMOVAL OF RETAINED LENS FRAGMENTS LEFT EYE;  Surgeon: Baruch Goldmann, MD;  Location: [facility];  Service: Ophthalmology;  Laterality: Left;  . CATARACT EXTRACTION W/PHACO Right 05/26/2020   Procedure: CATARACT EXTRACTION PHACO AND INTRAOCULAR LENS PLACEMENT RIGHT EYE;  Surgeon: Baruch Goldmann, MD;  Location: [facility];  Service: Ophthalmology;  Laterality: Right;  CDE: 6.38  . CORONARY ARTERY BYPASS GRAFT    . HYDROCELE EXCISION / REPAIR    . KNEE ARTHROPLASTY Left 07/04/2017   Procedure: COMPUTER ASSISTED TOTAL KNEE ARTHROPLASTY;  Surgeon: Dereck Leep, MD;  Location: [facility];  Service: Orthopedics;  Laterality: Left;  . THROAT SURGERY    . URETERAL STENT PLACEMENT     at the [place]  . Vocal cord poylp removal      Family History  Problem Relation Age of Onset  . CVA Mother   . Hypotension Mother   . CAD Father   . Heart attack Father   . Hypertension Sister   . Hypertension Brother   . Obesity Brother     Social History Social History   Tobacco Use  . Smoking status: Former Smoker    Packs/day: 1.00    Years: 7.00    Pack years: 7.00    Types: Cigarettes    Quit date: 08/31/1983    Years since quitting: 37.2  . Smokeless tobacco:  Never Used  . Tobacco comment: smoking cessation materials not required  Vaping Use  . Vaping Use: Never used  Substance Use Topics  . Alcohol use: No    Alcohol/week: 0.0 standard drinks  . Drug use: No    Allergies  Allergen Reactions  . Alfuzosin Other (See Comments)  . Pravastatin Other (See Comments)  . Terazosin Other (See Comments)    Current Outpatient Medications  Medication Sig Dispense Refill  . acetaminophen (TYLENOL) 650 MG CR tablet Take 650 mg by mouth every 8 (eight) hours as needed for pain.    [name] albuterol (VENTOLIN HFA) 108 (90 Base) MCG/ACT inhaler Inhale 1-2 puffs into the lungs every 6 (six) hours as needed for wheezing or shortness  of breath.     . allopurinol (ZYLOPRIM) 300 MG tablet Take 1 tablet (300 mg total) by mouth daily. 30 tablet 5  . amLODipine (NORVASC) 10 MG tablet Take 10 mg by mouth every evening.    [name] aspirin EC 81 MG tablet Take 81 mg by mouth daily. Swallow whole.    . docusate sodium (COLACE) 100 MG capsule Take 200 mg by mouth every other day.    . finasteride (PROSCAR) 5 MG tablet Take 5 mg by mouth daily.    . fluticasone (FLONASE) 50 MCG/ACT nasal spray Place 2 sprays into both nostrils daily as needed for rhinitis.     [name] HYDROcodone-acetaminophen (NORCO/VICODIN) 5-325 MG tablet One tablet every four hours as needed for acute pain.  Limit of five days per [place] statue. 30 tablet 0  . lisinopril (PRINIVIL,ZESTRIL) 40 MG tablet Take 40 mg by mouth daily.    [name] LORazepam (ATIVAN) 0.5 MG tablet Take 0.5 mg by mouth at bedtime as needed for sleep.    . meloxicam (MOBIC) 15 MG tablet Take 15 mg by mouth daily.    . metoprolol tartrate (LOPRESSOR) 50 MG tablet Take 50 mg by mouth 2 (two) times daily.    . Multiple Vitamin (MULTIVITAMIN WITH MINERALS) TABS tablet Take 1 tablet by mouth daily.    . Omega-3 Fatty Acids (FISH OIL) 1200 MG CAPS Take 2 capsules (2,400 mg total) by mouth in the morning and at bedtime. 60 capsule 0  . Potassium Chloride ER 20 MEQ TBCR Take 40 mEq by mouth 2 (two) times daily.     . Psyllium (METAMUCIL FIBER PO) Take 1 Dose by mouth See admin instructions. Once to twice daily for regularity    . rosuvastatin (CRESTOR) 10 MG tablet Take 5 mg by mouth at bedtime.     . tamsulosin (FLOMAX) 0.4 MG CAPS capsule Take 0.4 mg by mouth daily.     [name] tiZANidine (ZANAFLEX) 4 MG tablet Take 1 tablet (4 mg total) by mouth every 6 (six) hours as needed for muscle spasms. 30 tablet 0  . torsemide (DEMADEX) 20 MG tablet Take 40 mg by mouth in the morning and at bedtime.    . vitamin B-12 (CYANOCOBALAMIN) 1000 MCG tablet Take 2,000 mcg by mouth daily.     No current facility-administered  medications for this visit.     Physical Exam  Blood pressure (!) 148/127, pulse 84, height 5\' 10"  (1.778 m), weight 265 lb (120.2 kg).  Constitutional: overall normal hygiene, normal nutrition, well developed, normal grooming, normal body habitus. Assistive device:none  Musculoskeletal: gait and station Limp none, muscle tone and strength are normal, no tremors or atrophy is present.  .  Neurological: coordination overall normal.  Deep tendon reflex/nerve stretch  intact.  Sensation normal.  Cranial nerves II-XII intact.   Skin:   Normal overall no scars, lesions, ulcers or rashes. No psoriasis.  Psychiatric: Alert and oriented x 3.  Recent memory intact, remote memory unclear.  Normal mood and affect. Well groomed.  Good eye contact.  Cardiovascular: overall no swelling, no varicosities, no edema bilaterally, normal temperatures of the legs and arms, no clubbing, cyanosis and good capillary refill.  Lymphatic: palpation is normal.  All other systems reviewed and are negative   The patient has been educated about the nature of the problem(s) and counseled on treatment options.  The patient appeared to understand what I have discussed and is in agreement with it.  Encounter Diagnosis  Name Primary?  . 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  Precautions discussed.  Continue current medications.   Return to clinic prn   I called in allopurinol.  Electronically Signed Sanjuana Kava, MD 3/29/202210:10 AM

## 2021-01-14 IMAGING — DX DG SHOULDER 2+V*L*
3 series · 3 of 3 positions shown · non-contrast
Comparison: None.

CLINICAL DATA: 74-year-old with a possible LEFT shoulder
dislocation several weeks ago, presenting with persistent pain in
the neck and LEFT shoulder.

EXAM:
LEFT SHOULDER - 2+ VIEW

[shoulder internal rotation ap]
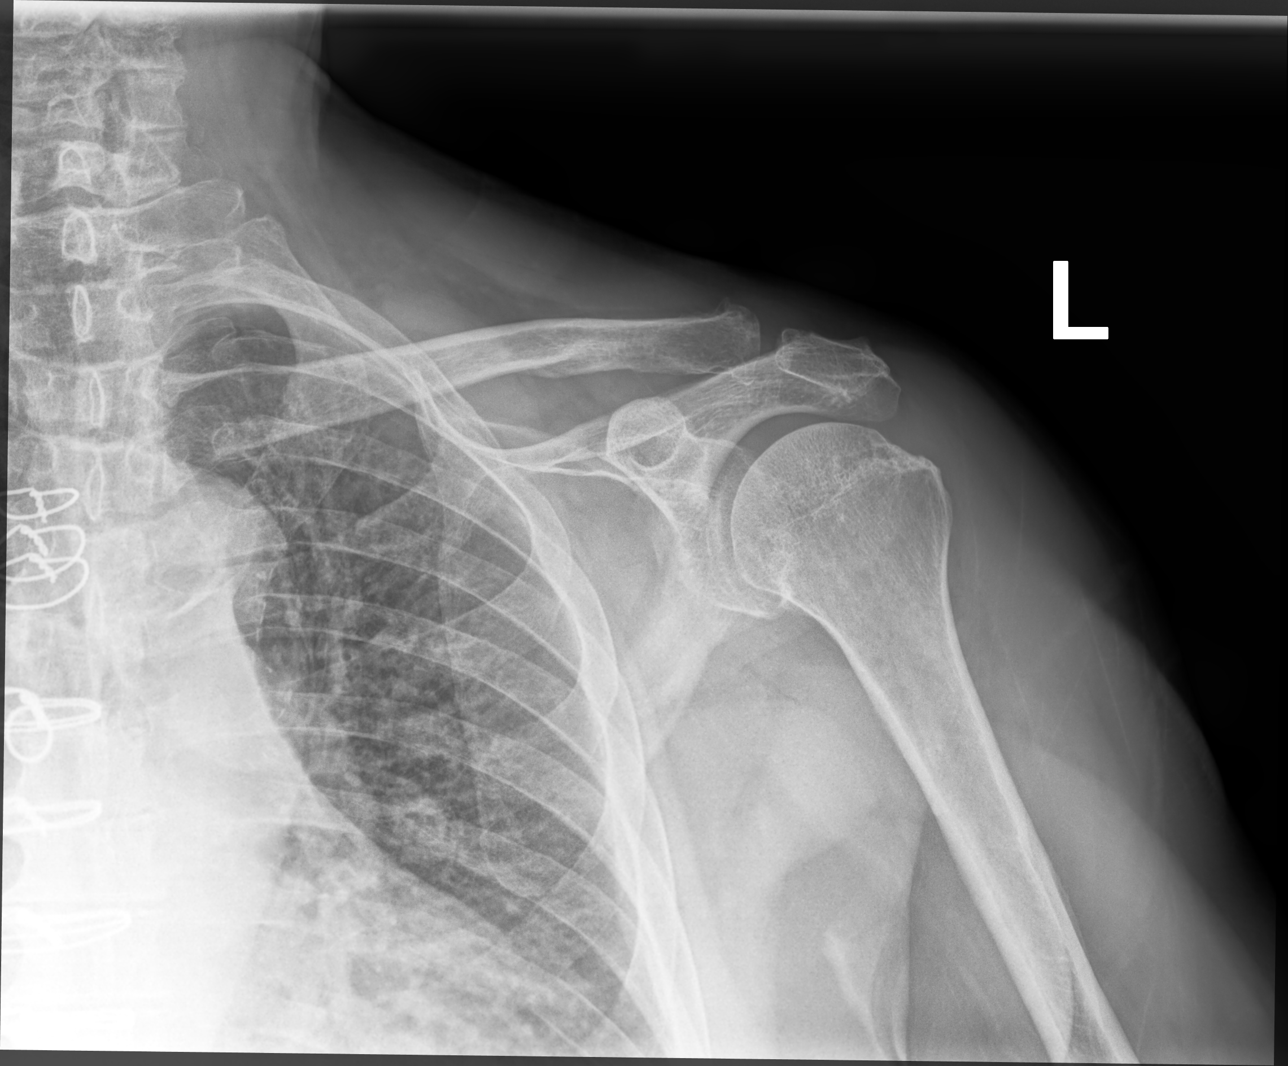

[shoulder external rotation ap]
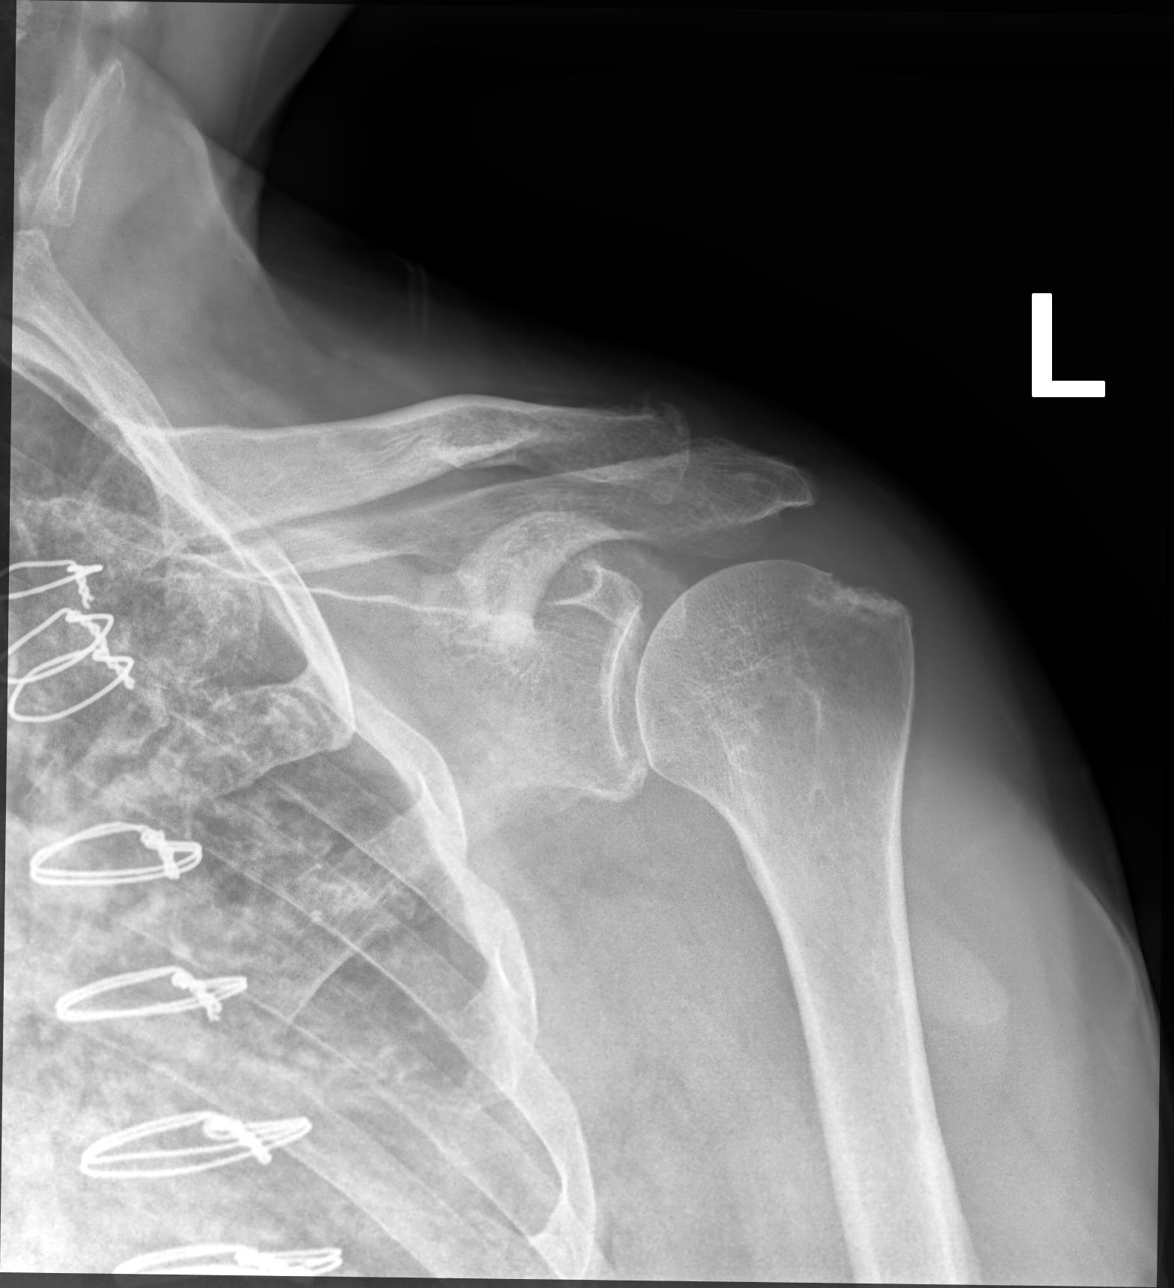

[shoulder (y view) lat]
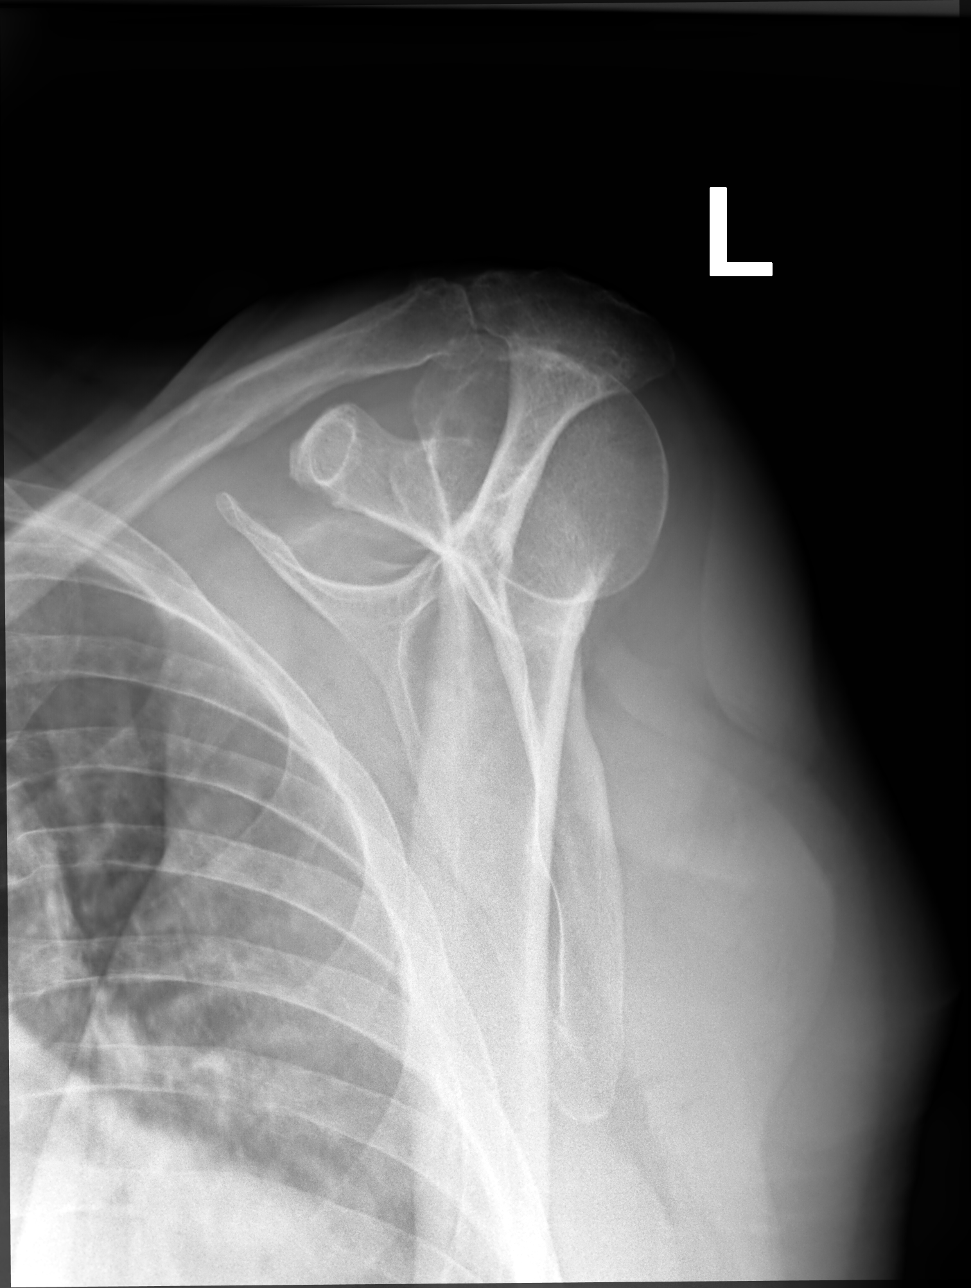

[3 of 3 positions shown; findings below may reference images not displayed]

FINDINGS: No evidence of acute or subacute fracture. Glenohumeral joint
anatomically aligned with a well-preserved joint space. Subacromial
space is slightly narrowed and there is irregularity at the
insertion of the supraspinatus tendon on the greater tuberosity of
the humeral head. Acromioclavicular joint anatomically aligned with
moderate degenerative changes. Bone mineral density well-preserved.
IMPRESSION: 1. No acute or subacute osseous abnormality.
2. Moderate degenerative changes involving the acromioclavicular
joint.
3. Narrowed acromiohumeral space which may indicate chronic
supraspinatus tendon disease.

## 2021-01-21 DIAGNOSIS — J040 Acute laryngitis: Secondary | ICD-10-CM | POA: Diagnosis not present

## 2021-01-27 DIAGNOSIS — I10 Essential (primary) hypertension: Secondary | ICD-10-CM | POA: Diagnosis not present

## 2021-01-27 DIAGNOSIS — E7800 Pure hypercholesterolemia, unspecified: Secondary | ICD-10-CM | POA: Diagnosis not present

## 2021-02-16 DIAGNOSIS — E782 Mixed hyperlipidemia: Secondary | ICD-10-CM | POA: Diagnosis not present

## 2021-02-16 DIAGNOSIS — I10 Essential (primary) hypertension: Secondary | ICD-10-CM | POA: Diagnosis not present

## 2021-02-16 DIAGNOSIS — E119 Type 2 diabetes mellitus without complications: Secondary | ICD-10-CM | POA: Diagnosis not present

## 2021-02-23 DIAGNOSIS — E118 Type 2 diabetes mellitus with unspecified complications: Secondary | ICD-10-CM | POA: Diagnosis not present

## 2021-02-23 DIAGNOSIS — K219 Gastro-esophageal reflux disease without esophagitis: Secondary | ICD-10-CM | POA: Diagnosis not present

## 2021-02-23 DIAGNOSIS — I10 Essential (primary) hypertension: Secondary | ICD-10-CM | POA: Diagnosis not present

## 2021-02-23 DIAGNOSIS — I480 Paroxysmal atrial fibrillation: Secondary | ICD-10-CM | POA: Diagnosis not present

## 2021-02-23 DIAGNOSIS — M170 Bilateral primary osteoarthritis of knee: Secondary | ICD-10-CM | POA: Diagnosis not present

## 2021-02-23 DIAGNOSIS — N401 Enlarged prostate with lower urinary tract symptoms: Secondary | ICD-10-CM | POA: Diagnosis not present

## 2021-02-23 DIAGNOSIS — E559 Vitamin D deficiency, unspecified: Secondary | ICD-10-CM | POA: Diagnosis not present

## 2021-02-23 DIAGNOSIS — I2510 Atherosclerotic heart disease of native coronary artery without angina pectoris: Secondary | ICD-10-CM | POA: Diagnosis not present

## 2021-02-23 DIAGNOSIS — E782 Mixed hyperlipidemia: Secondary | ICD-10-CM | POA: Diagnosis not present

## 2021-02-26 DIAGNOSIS — I10 Essential (primary) hypertension: Secondary | ICD-10-CM | POA: Diagnosis not present

## 2021-02-26 DIAGNOSIS — E1165 Type 2 diabetes mellitus with hyperglycemia: Secondary | ICD-10-CM | POA: Diagnosis not present

## 2021-03-29 DIAGNOSIS — I10 Essential (primary) hypertension: Secondary | ICD-10-CM | POA: Diagnosis not present

## 2021-03-29 DIAGNOSIS — E1165 Type 2 diabetes mellitus with hyperglycemia: Secondary | ICD-10-CM | POA: Diagnosis not present

## 2021-04-18 ENCOUNTER — Ambulatory Visit
Admission: EM | Admit: 2021-04-18 | Discharge: 2021-04-18 | Disposition: A | Discharge status: Home / Self Care | Payer: Medicare HMO | Attending: Internal Medicine | Admitting: Internal Medicine

## 2021-04-18 ENCOUNTER — Other Ambulatory Visit: Payer: Self-pay

## 2021-04-18 DIAGNOSIS — N401 Enlarged prostate with lower urinary tract symptoms: Secondary | ICD-10-CM | POA: Diagnosis not present

## 2021-04-18 DIAGNOSIS — M7022 Olecranon bursitis, left elbow: Secondary | ICD-10-CM | POA: Diagnosis not present

## 2021-04-18 DIAGNOSIS — N3001 Acute cystitis with hematuria: Secondary | ICD-10-CM

## 2021-04-18 DIAGNOSIS — R300 Dysuria: Secondary | ICD-10-CM | POA: Diagnosis not present

## 2021-04-18 LAB — POCT URINALYSIS DIP (MANUAL ENTRY)
Bilirubin, UA: NEGATIVE
Glucose, UA: NEGATIVE mg/dL
Ketones, POC UA: NEGATIVE mg/dL
Nitrite, UA: NEGATIVE
Protein Ur, POC: NEGATIVE mg/dL
Spec Grav, UA: 1.015 (ref 1.010–1.025)
Urobilinogen, UA: 0.2 E.U./dL
pH, UA: 6 (ref 5.0–8.0)

## 2021-04-18 MED ORDER — CEPHALEXIN 500 MG PO CAPS: 500.0000 mg | ORAL_CAPSULE | Freq: Two times a day (BID) | ORAL | 0 refills | Status: AC

## 2021-04-18 NOTE — ED Provider Notes (Signed)
[facility]    CSN: [id number] Arrival date & time: 04/18/21  [id number]      History   Chief Complaint Chief Complaint  Patient presents with   Urinary Tract Infection   Joint Swelling    HPI Darius Grimes is a 75 y.o. male comes to the urgent care with a few days history of dysuria, urgency or frequency.  Patient has a history of BPH currently on medications.  He says over the past few days he is experiencing dysuria, urgency and frequency.  That is unusual for him.  He denies any flank pain, nausea or vomiting.  No fever or chills  Patient also has swelling on the left elbow which has been present for the past 2 weeks.  It started after he fell and has pain swelling over the past couple of weeks.  It is uncomfortable.  He denies any redness over the left elbow.   HPI  Past Medical History:  Diagnosis Date   Acid reflux    Allergic rhinitis    Arthritis    Coronary artery disease    Dysrhythmia    Episodic atrial fibrillation (HCC)    GERD (gastroesophageal reflux disease)    Heart palpitations    History of kidney stones    Hyperlipidemia    Hypertension    Kidney stones    Neck pain    Recurrent nephrolithiasis    Skin lesion of back     Patient Active Problem List   Diagnosis Date Noted   Osteoarthritis of knees, bilateral 11/11/2020   S/P total knee arthroplasty 07/04/2017   Vitamin D deficiency 10/22/2016   B12 deficiency 10/22/2016   Wears hearing aid 08/19/2015   Bronchitis with bronchospasm 07/18/2015   Allergic rhinitis 03/19/2015   AF (paroxysmal atrial fibrillation) ([place]) 03/19/2015   Acid reflux 03/19/2015   Dyslipidemia 03/19/2015   Hypertension goal BP (blood pressure) < 140/90 03/19/2015   Cervical radiculitis 03/19/2015   Calculus of kidney 03/19/2015   S/P coronary artery bypass graft x 3 03/06/2015    Past Surgical History:  Procedure Laterality Date   CARPAL TUNNEL RELEASE Left    CATARACT EXTRACTION W/PHACO Left 02/01/2020    Procedure: CATARACT EXTRACTION PHACO AND INTRAOCULAR LENS PLACEMENT ([place]);  Surgeon: Baruch Goldmann, MD;  Location: [facility];  Service: Ophthalmology;  Laterality: Left;  CDE: 8.89   CATARACT EXTRACTION W/PHACO Left 02/15/2020   Procedure: REMOVAL OF RETAINED LENS FRAGMENTS LEFT EYE;  Surgeon: Baruch Goldmann, MD;  Location: [facility];  Service: Ophthalmology;  Laterality: Left;   CATARACT EXTRACTION W/PHACO Right 05/26/2020   Procedure: CATARACT EXTRACTION PHACO AND INTRAOCULAR LENS PLACEMENT RIGHT EYE;  Surgeon: Baruch Goldmann, MD;  Location: [facility];  Service: Ophthalmology;  Laterality: Right;  CDE: 6.38   CORONARY ARTERY BYPASS GRAFT     HYDROCELE EXCISION / REPAIR     KNEE ARTHROPLASTY Left 07/04/2017   Procedure: COMPUTER ASSISTED TOTAL KNEE ARTHROPLASTY;  Surgeon: Dereck Leep, MD;  Location: [facility];  Service: Orthopedics;  Laterality: Left;   THROAT SURGERY     URETERAL STENT PLACEMENT     at the [facility]   Vocal cord poylp removal         Home Medications    Prior to Admission medications   Medication Sig Start Date End Date Taking? Authorizing Provider  cephALEXin (KEFLEX) 500 MG capsule Take 1 capsule (500 mg total) by mouth 2 (two) times daily for 5 days. 04/18/21 04/23/21 Yes Jhaden Pizzuto, Myrene Galas, MD  acetaminophen (TYLENOL) 650 MG CR tablet Take 650 mg by mouth every 8 (eight) hours as needed for pain.    [provider]  albuterol (VENTOLIN HFA) 108 (90 Base) MCG/ACT inhaler Inhale 1-2 puffs into the lungs every 6 (six) hours as needed for wheezing or shortness of breath.     [provider]  allopurinol (ZYLOPRIM) 300 MG tablet Take 1 tablet (300 mg total) by mouth daily. 11/25/20   Sanjuana Kava, MD  amLODipine (NORVASC) 10 MG tablet Take 10 mg by mouth every evening.    [provider]  aspirin EC 81 MG tablet Take 81 mg by mouth daily. Swallow whole.    [provider]  docusate sodium (COLACE) 100 MG capsule Take 200 mg by mouth every other day.     [provider]  finasteride (PROSCAR) 5 MG tablet Take 5 mg by mouth daily.    [provider]  fluticasone (FLONASE) 50 MCG/ACT nasal spray Place 2 sprays into both nostrils daily as needed for rhinitis.     [provider]  HYDROcodone-acetaminophen (NORCO/VICODIN) 5-325 MG tablet One tablet every four hours as needed for acute pain.  Limit of five days per [place] statue. 11/11/20   Sanjuana Kava, MD  lisinopril (PRINIVIL,ZESTRIL) 40 MG tablet Take 40 mg by mouth daily.    [provider]  LORazepam (ATIVAN) 0.5 MG tablet Take 0.5 mg by mouth at bedtime as needed for sleep. 05/08/20   [provider]  meloxicam (MOBIC) 15 MG tablet Take 15 mg by mouth daily. 01/07/20   [provider]  metoprolol tartrate (LOPRESSOR) 50 MG tablet Take 50 mg by mouth 2 (two) times daily. 10/29/19   [provider]  Multiple Vitamin (MULTIVITAMIN WITH MINERALS) TABS tablet Take 1 tablet by mouth daily.    [provider]  Omega-3 Fatty Acids (FISH OIL) 1200 MG CAPS Take 2 capsules (2,400 mg total) by mouth in the morning and at bedtime. 02/01/20   Baruch Goldmann, MD  Potassium Chloride ER 20 MEQ TBCR Take 40 mEq by mouth 2 (two) times daily.  03/06/20   [provider]  Psyllium (METAMUCIL FIBER PO) Take 1 Dose by mouth See admin instructions. Once to twice daily for regularity    [provider]  rosuvastatin (CRESTOR) 10 MG tablet Take 5 mg by mouth at bedtime.     [provider]  tamsulosin (FLOMAX) 0.4 MG CAPS capsule Take 0.4 mg by mouth daily.     [provider]  tiZANidine (ZANAFLEX) 4 MG tablet Take 1 tablet (4 mg total) by mouth every 6 (six) hours as needed for muscle spasms. 09/11/20   Faustino Congress, NP  torsemide (DEMADEX) 20 MG tablet Take 40 mg by mouth in the morning and at bedtime. 11/16/19   [provider]  vitamin B-12 (CYANOCOBALAMIN) 1000 MCG tablet Take 2,000 mcg by mouth daily.     [provider]    Family History Family History  Problem Relation Age of Onset   CVA Mother    Hypotension Mother    CAD Father    Heart attack Father    Hypertension Sister    Hypertension Brother    Obesity Brother     Social History Social History   Tobacco Use   Smoking status: Former    Packs/day: 1.00    Years: 7.00    Pack years: 7.00    Types: Cigarettes    Quit date: 08/31/1983  Years since quitting: 37.6   Smokeless tobacco: Never   Tobacco comments:    smoking cessation materials not required  Vaping Use   Vaping Use: Never used  Substance Use Topics   Alcohol use: No    Alcohol/week: 0.0 standard drinks   Drug use: No     Allergies   Alfuzosin, Pravastatin, and Terazosin   Review of Systems Review of Systems  Constitutional: Negative.   Respiratory: Negative.    Gastrointestinal: Negative.   Genitourinary:  Positive for dysuria, frequency and urgency. Negative for flank pain.  Musculoskeletal:  Positive for arthralgias and joint swelling.    Physical Exam Triage Vital Signs ED Triage Vitals  Enc Vitals Group     BP 04/18/21 0821 (!) 152/94     Pulse Rate 04/18/21 0821 80     Resp 04/18/21 0821 18     Temp 04/18/21 0821 98.2 F (36.8 C)     Temp Source 04/18/21 0821 Oral     SpO2 04/18/21 0821 93 %     Weight --      Height --      Head Circumference --      Peak Flow --      Pain Score 04/18/21 0822 5     Pain Loc --      Pain Edu? --      Excl. in [place]? --    No data found.  Updated Vital Signs BP (!) 152/94 (BP Location: Right Arm)   Pulse 80   Temp 98.2 F (36.8 C) (Oral)   Resp 18   SpO2 93%   Visual Acuity Right Eye Distance:   Left Eye Distance:   Bilateral Distance:    Right Eye Near:   Left Eye Near:    Bilateral Near:     Physical Exam Vitals and nursing note reviewed.  Constitutional:      General: He is not in acute distress.    Appearance: He is not ill-appearing.  Cardiovascular:     Rate  and Rhythm: Normal rate and regular rhythm.  Pulmonary:     Effort: Pulmonary effort is normal.     Breath sounds: Normal breath sounds.  Abdominal:     General: Bowel sounds are normal.     Palpations: Abdomen is soft.  Musculoskeletal:     Comments: Nontender swelling over the olecranon process of the left elbow.  Full range of motion of the left elbow with no pain or discomfort.  No surrounding erythema  Neurological:     Mental Status: He is alert.     UC Treatments / Results  Labs (all labs ordered are listed, but only abnormal results are displayed) Labs Reviewed  POCT URINALYSIS DIP (MANUAL ENTRY) - Abnormal; Notable for the following components:      Result Value   Blood, UA moderate (*)    Leukocytes, UA Small (1+) (*)    All other components within normal limits  URINE CULTURE  BODY FLUID CULTURE W GRAM STAIN  SYNOVIAL CELL COUNT + DIFF, W/ CRYSTALS  PSA    EKG   Radiology No results found.  Procedures Procedures (including critical care time)  Medications Ordered in UC Medications - No data to display  Initial Impression / Assessment and Plan / UC Course  I have reviewed the triage vital signs and the nursing notes.  Pertinent labs & imaging results that were available during my care of the patient were reviewed by me and considered in my medical  decision making (see chart for details).     1.  Acute cystitis with hematuria: Keflex 500 mg twice daily x5 days Urine cultures have been sent We will call patient with recommendations if labs are abnormal Return to urgent care if symptoms worsen  2.  Olecranon bursitis: Aspiration of the olecranon bursa was done Synovial fluid was sent for fluid analysis, gram stain and culture Icing of the left elbow Take NSAIDs. Return precautions given. Final Clinical Impressions(s) / UC Diagnoses   Final diagnoses:  Dysuria  Olecranon bursitis of left elbow  Benign prostatic hyperplasia with lower urinary  tract symptoms, symptom details unspecified  Acute cystitis with hematuria     Discharge Instructions      Icing of the ER left elbow is recommended Take ibuprofen as needed for pain We will call you with recommendations if labs are abnormal Return to urgent care if symptoms worsen.   ED Prescriptions     Medication Sig Dispense Auth. Provider   cephALEXin (KEFLEX) 500 MG capsule Take 1 capsule (500 mg total) by mouth 2 (two) times daily for 5 days. 10 capsule Leonia Heatherly, Myrene Galas, MD      PDMP not reviewed this encounter.   Chase Picket, MD 04/18/21 [phone]

## 2021-04-18 NOTE — Discharge Instructions (Addendum)
Icing of the ER left elbow is recommended Take ibuprofen as needed for pain We will call you with recommendations if labs are abnormal Return to urgent care if symptoms worsen.

## 2021-04-18 NOTE — ED Triage Notes (Signed)
Pt present swelling to his left elbow, a knot underneath his right arm and burning sensation while urinating. Symptoms started a week ago.

## 2021-04-19 LAB — SYNOVIAL CELL COUNT + DIFF, W/ CRYSTALS
Crystals, Fluid: NONE SEEN
Eosinophils-Synovial: 1 % (ref 0–1)
Lymphocytes-Synovial Fld: 47 % — ABNORMAL HIGH (ref 0–20)
Monocyte-Macrophage-Synovial Fluid: 47 % — ABNORMAL LOW (ref 50–90)
Neutrophil, Synovial: 5 % (ref 0–25)
WBC, Synovial: 39 /mm3 (ref 0–200)

## 2021-04-19 LAB — URINE CULTURE

## 2021-04-22 LAB — BODY FLUID CULTURE W GRAM STAIN
Culture: NO GROWTH
Gram Stain: NONE SEEN
Special Requests: NORMAL

## 2021-04-29 DIAGNOSIS — I10 Essential (primary) hypertension: Secondary | ICD-10-CM | POA: Diagnosis not present

## 2021-04-29 DIAGNOSIS — E1165 Type 2 diabetes mellitus with hyperglycemia: Secondary | ICD-10-CM | POA: Diagnosis not present

## 2021-05-29 DIAGNOSIS — I10 Essential (primary) hypertension: Secondary | ICD-10-CM | POA: Diagnosis not present

## 2021-05-29 DIAGNOSIS — E1165 Type 2 diabetes mellitus with hyperglycemia: Secondary | ICD-10-CM | POA: Diagnosis not present

## 2021-06-19 DIAGNOSIS — H11001 Unspecified pterygium of right eye: Secondary | ICD-10-CM | POA: Diagnosis not present

## 2021-06-22 DIAGNOSIS — E118 Type 2 diabetes mellitus with unspecified complications: Secondary | ICD-10-CM | POA: Diagnosis not present

## 2021-06-22 DIAGNOSIS — E782 Mixed hyperlipidemia: Secondary | ICD-10-CM | POA: Diagnosis not present

## 2021-06-25 DIAGNOSIS — I10 Essential (primary) hypertension: Secondary | ICD-10-CM | POA: Diagnosis not present

## 2021-06-25 DIAGNOSIS — E782 Mixed hyperlipidemia: Secondary | ICD-10-CM | POA: Diagnosis not present

## 2021-06-25 DIAGNOSIS — N401 Enlarged prostate with lower urinary tract symptoms: Secondary | ICD-10-CM | POA: Diagnosis not present

## 2021-06-25 DIAGNOSIS — E559 Vitamin D deficiency, unspecified: Secondary | ICD-10-CM | POA: Diagnosis not present

## 2021-06-25 DIAGNOSIS — K219 Gastro-esophageal reflux disease without esophagitis: Secondary | ICD-10-CM | POA: Diagnosis not present

## 2021-06-25 DIAGNOSIS — M170 Bilateral primary osteoarthritis of knee: Secondary | ICD-10-CM | POA: Diagnosis not present

## 2021-06-25 DIAGNOSIS — E118 Type 2 diabetes mellitus with unspecified complications: Secondary | ICD-10-CM | POA: Diagnosis not present

## 2021-06-25 DIAGNOSIS — I480 Paroxysmal atrial fibrillation: Secondary | ICD-10-CM | POA: Diagnosis not present

## 2021-06-25 DIAGNOSIS — I2510 Atherosclerotic heart disease of native coronary artery without angina pectoris: Secondary | ICD-10-CM | POA: Diagnosis not present

## 2021-06-29 DIAGNOSIS — E1165 Type 2 diabetes mellitus with hyperglycemia: Secondary | ICD-10-CM | POA: Diagnosis not present

## 2021-06-29 DIAGNOSIS — I10 Essential (primary) hypertension: Secondary | ICD-10-CM | POA: Diagnosis not present

## 2021-07-07 ENCOUNTER — Telehealth: Payer: Self-pay | Admitting: Internal Medicine

## 2021-07-07 ENCOUNTER — Other Ambulatory Visit: Payer: Self-pay

## 2021-07-07 ENCOUNTER — Ambulatory Visit (INDEPENDENT_AMBULATORY_CARE_PROVIDER_SITE_OTHER): Payer: Self-pay | Admitting: *Deleted

## 2021-07-07 VITALS — Ht 70.0 in | Wt 275.2 lb

## 2021-07-07 DIAGNOSIS — Z86010 Personal history of colonic polyps: Secondary | ICD-10-CM

## 2021-07-07 MED ORDER — PEG 3350-KCL-NA BICARB-NACL 420 G PO SOLR: 4000.0000 mL | Freq: Once | ORAL | 0 refills | Status: AC

## 2021-07-07 NOTE — Telephone Encounter (Signed)
Spoke to pt.  Pt requested to have procedure week of Dec 22.  Informed him that I would get back with him once provider has reviewed his triage.  Pt voiced understanding.

## 2021-07-07 NOTE — Progress Notes (Signed)
Pt is going to call me back when he checks his schedule for December.

## 2021-07-07 NOTE — Progress Notes (Signed)
Gastroenterology Pre-Procedure Review  Request Date: 07/07/2021 Requesting Physician: Last triage 04/03/2020, Last TCS 07/19/2016 done by Dr. Laurance Flatten at [facility], normal ileum, diverticulosis, no polyps, hx tubular adenomas 2007 and 2012, negative high grade dysplasia  PATIENT REVIEW QUESTIONS: The patient responded to the following health history questions as indicated:    1. Diabetes Melitis: no 2. Joint replacements in the past 12 months: no 3. Major health problems in the past 3 months: no 4. Has an artificial valve or MVP: no 5. Has a defibrillator: no 6. Has been advised in past to take antibiotics in advance of a procedure like teeth cleaning: no 7. Family history of colon cancer: no 8. Alcohol Use: no 9. Illicit drug Use: no 10. History of sleep apnea:  no 11. History of coronary artery or other vascular stents placed within the last 12 months: no 12. History of any prior anesthesia complications: no 13. Body mass index is 39.49 kg/m.    MEDICATIONS & ALLERGIES:    Patient reports the following regarding taking any blood thinners:   Plavix? no Aspirin? Yes, 81 mg Coumadin? no Brilinta? no Xarelto? no Eliquis? no Pradaxa? no Savaysa? no Effient? no  Patient confirms/reports the following medications:  Current Outpatient Medications  Medication Sig Dispense Refill   acetaminophen (TYLENOL) 650 MG CR tablet Take 650 mg by mouth as needed for pain.     albuterol (VENTOLIN HFA) 108 (90 Base) MCG/ACT inhaler Inhale 1-2 puffs into the lungs as needed for wheezing or shortness of breath.     allopurinol (ZYLOPRIM) 300 MG tablet Take 1 tablet (300 mg total) by mouth daily. (Patient taking differently: Take 300 mg by mouth as needed.) 30 tablet 5   aspirin EC 81 MG tablet Take 81 mg by mouth daily. Swallow whole.     docusate sodium (COLACE) 100 MG capsule Take 200 mg by mouth as needed.     finasteride (PROSCAR) 5 MG tablet Take 5 mg by mouth daily.     fluticasone (FLONASE) 50  MCG/ACT nasal spray Place 2 sprays into both nostrils daily as needed for rhinitis.      HYDROcodone-acetaminophen (NORCO/VICODIN) 5-325 MG tablet One tablet every four hours as needed for acute pain.  Limit of five days per [place] statue. (Patient taking differently: 2 (two) times daily. One tablet every four hours as needed for acute pain.  Limit of five days per [place] statue.) 30 tablet 0   lisinopril (PRINIVIL,ZESTRIL) 40 MG tablet Take 40 mg by mouth daily.     LORazepam (ATIVAN) 0.5 MG tablet Take 0.5 mg by mouth at bedtime as needed for sleep.     meloxicam (MOBIC) 15 MG tablet Take 15 mg by mouth daily.     metoprolol tartrate (LOPRESSOR) 50 MG tablet Take 50 mg by mouth 2 (two) times daily.     Multiple Vitamin (MULTIVITAMIN WITH MINERALS) TABS tablet Take 1 tablet by mouth daily.     Omega-3 Fatty Acids (FISH OIL) 1200 MG CAPS Take 2 capsules (2,400 mg total) by mouth in the morning and at bedtime. 60 capsule 0   Potassium Chloride ER 20 MEQ TBCR Take 40 mEq by mouth 2 (two) times daily.      Psyllium (METAMUCIL FIBER PO) Take 1 Dose by mouth as needed. Once to twice daily for regularity     rosuvastatin (CRESTOR) 10 MG tablet Take 5 mg by mouth as needed.     tamsulosin (FLOMAX) 0.4 MG CAPS capsule Take 0.4 mg  by mouth daily.      tiZANidine (ZANAFLEX) 4 MG tablet Take 1 tablet (4 mg total) by mouth every 6 (six) hours as needed for muscle spasms. (Patient taking differently: Take 4 mg by mouth as needed for muscle spasms.) 30 tablet 0   torsemide (DEMADEX) 20 MG tablet Take 40 mg by mouth in the morning and at bedtime.     vitamin B-12 (CYANOCOBALAMIN) 1000 MCG tablet Take 2,000 mcg by mouth daily.     No current facility-administered medications for this visit.    Patient confirms/reports the following allergies:  Allergies  Allergen Reactions   Alfuzosin Other (See Comments)   Pravastatin Other (See Comments)   Terazosin Other (See Comments)    No orders of the defined  types were placed in this encounter.   AUTHORIZATION INFORMATION Primary Insurance: [facility],  ID #: [id number],  Group #: [id number] [place] Pre-Cert / Josem Kaufmann required: No, not required  SCHEDULE INFORMATION: Procedure has been scheduled as follows:  Date: , Time:   Location: [facility] with Dr. Abbey Chatters  This Gastroenterology Pre-Precedure Review Form is being routed to the following provider(s): Aliene Altes, PA-C

## 2021-07-07 NOTE — Progress Notes (Signed)
Medical history meets ASA III criteria (specifically history of CAD with CABG). Per protocol, he will need OV to arrange procedure.

## 2021-07-07 NOTE — Telephone Encounter (Signed)
Pt called asking to speak with Christ Kick, [place]. I told him that she is getting ready to triage a patient and she would call him back. [phone]

## 2021-07-08 NOTE — Progress Notes (Signed)
Spoke with pt and informed him due to medical hx that he would require ov.  Scheduled ov for 07/13/2021 at 10:30 with Aliene Altes, PA-C.  Pt voiced understanding.

## 2021-07-11 NOTE — Progress Notes (Deleted)
Referring Provider:Hall, Darius Areola, MD Primary Care Physician:  Darius Squibb, MD Primary Gastroenterologist:  Dr. Abbey Chatters  No chief complaint on file.   HPI:   Darius Grimes is a 75 y.o. male presenting today at the request of Darius Grimes, Darius Areola, MD for consult colonoscopy.  Patient was initially a nurse triage earlier this month, but due to medical history, he met ASA 3 criteria and required and OV to arrange his procedure.  His last colonoscopy was in November 2017 by Dr. Laurance Grimes at [facility] revealing a normal ileum, diverticulosis, no polyps.  He does have history of tubular adenomas in 2007 and 2012.  Today:      Request cardiac clearance due to limited information in chart.   NM Myocardial Perfusion Multiple with Spect Test &/Stress June 2022 Impression: 1. Abnormal myocardial perfusion scintigraphy compatible with a  moderate severity, small myocardial perfusion defect involving  the apical inferior and apex segments. The patient achieved  target heart rate and the usual clinical syndrome of dyspnea was  reproduced.   2. Mildly enlarged left ventricle with mildly reduced systolic  function. 46%.   3. Please also see separate same-day cardiology stress test  consult note and separate same-day nondiagnostic CT report.   Attenuation correction CT images demonstrate postoperative  changes of CABG. No significant incidental findings.   Electronically Signed By: Wende Crease Electronically Signed On: 02/12/2021 5:27 PM   Past Medical History:  Diagnosis Date   Acid reflux    Allergic rhinitis    Arthritis    Coronary artery disease    Dysrhythmia    Episodic atrial fibrillation (HCC)    GERD (gastroesophageal reflux disease)    Heart palpitations    History of kidney stones    Hyperlipidemia    Hypertension    Kidney stones    Neck pain    Recurrent nephrolithiasis    Skin lesion of back     Past Surgical History:  Procedure Laterality Date   CARPAL TUNNEL  RELEASE Left    CATARACT EXTRACTION W/PHACO Left 02/01/2020   Procedure: CATARACT EXTRACTION PHACO AND INTRAOCULAR LENS PLACEMENT ([place]);  Surgeon: Darius Goldmann, MD;  Location: [facility];  Service: Ophthalmology;  Laterality: Left;  CDE: 8.89   CATARACT EXTRACTION W/PHACO Left 02/15/2020   Procedure: REMOVAL OF RETAINED LENS FRAGMENTS LEFT EYE;  Surgeon: Darius Goldmann, MD;  Location: [facility];  Service: Ophthalmology;  Laterality: Left;   CATARACT EXTRACTION W/PHACO Right 05/26/2020   Procedure: CATARACT EXTRACTION PHACO AND INTRAOCULAR LENS PLACEMENT RIGHT EYE;  Surgeon: Darius Goldmann, MD;  Location: [facility];  Service: Ophthalmology;  Laterality: Right;  CDE: 6.38   CORONARY ARTERY BYPASS GRAFT     HYDROCELE EXCISION / REPAIR     KNEE ARTHROPLASTY Left 07/04/2017   Procedure: COMPUTER ASSISTED TOTAL KNEE ARTHROPLASTY;  Surgeon: Darius Leep, MD;  Location: [facility];  Service: Orthopedics;  Laterality: Left;   THROAT SURGERY     URETERAL STENT PLACEMENT     at [facility]   Vocal cord poylp removal      Current Outpatient Medications  Medication Sig Dispense Refill   acetaminophen (TYLENOL) 650 MG CR tablet Take 650 mg by mouth as needed for pain.     albuterol (VENTOLIN HFA) 108 (90 Base) MCG/ACT inhaler Inhale 1-2 puffs into the lungs as needed for wheezing or shortness of breath.     allopurinol (ZYLOPRIM) 300 MG tablet Take 1 tablet (300 mg total) by mouth daily. (Patient  taking differently: Take 300 mg by mouth as needed.) 30 tablet 5   aspirin EC 81 MG tablet Take 81 mg by mouth daily. Swallow whole.     docusate sodium (COLACE) 100 MG capsule Take 200 mg by mouth as needed.     finasteride (PROSCAR) 5 MG tablet Take 5 mg by mouth daily.     fluticasone (FLONASE) 50 MCG/ACT nasal spray Place 2 sprays into both nostrils daily as needed for rhinitis.      HYDROcodone-acetaminophen (NORCO/VICODIN) 5-325 MG tablet One tablet every four hours as needed for acute pain.  Limit of five days per [place]  state statue. (Patient taking differently: 2 (two) times daily. One tablet every four hours as needed for acute pain.  Limit of five days per [place] statue.) 30 tablet 0   lisinopril (PRINIVIL,ZESTRIL) 40 MG tablet Take 40 mg by mouth daily.     LORazepam (ATIVAN) 0.5 MG tablet Take 0.5 mg by mouth at bedtime as needed for sleep.     meloxicam (MOBIC) 15 MG tablet Take 15 mg by mouth daily.     metoprolol tartrate (LOPRESSOR) 50 MG tablet Take 50 mg by mouth 2 (two) times daily.     Multiple Vitamin (MULTIVITAMIN WITH MINERALS) TABS tablet Take 1 tablet by mouth daily.     Omega-3 Fatty Acids (FISH OIL) 1200 MG CAPS Take 2 capsules (2,400 mg total) by mouth in the morning and at bedtime. 60 capsule 0   Potassium Chloride ER 20 MEQ TBCR Take 40 mEq by mouth 2 (two) times daily.      Psyllium (METAMUCIL FIBER PO) Take 1 Dose by mouth as needed. Once to twice daily for regularity     rosuvastatin (CRESTOR) 10 MG tablet Take 5 mg by mouth as needed.     tamsulosin (FLOMAX) 0.4 MG CAPS capsule Take 0.4 mg by mouth daily.      tiZANidine (ZANAFLEX) 4 MG tablet Take 1 tablet (4 mg total) by mouth every 6 (six) hours as needed for muscle spasms. (Patient taking differently: Take 4 mg by mouth as needed for muscle spasms.) 30 tablet 0   torsemide (DEMADEX) 20 MG tablet Take 40 mg by mouth in the morning and at bedtime.     vitamin B-12 (CYANOCOBALAMIN) 1000 MCG tablet Take 2,000 mcg by mouth daily.     No current facility-administered medications for this visit.    Allergies as of 07/13/2021 - Review Complete 07/07/2021  Allergen Reaction Noted   Alfuzosin Other (See Comments) 10/04/2014   Pravastatin Other (See Comments) 01/07/2009   Terazosin Other (See Comments) 10/04/2014    Family History  Problem Relation Age of Onset   CVA Mother    Hypotension Mother    CAD Father    Heart attack Father    Hypertension Sister    Hypertension Brother    Obesity Brother     Social History    Socioeconomic History   Marital status: Married    Spouse name: Darius Grimes   Number of children: 2   Years of education: Not on file   Highest education level: 12th grade  Occupational History    Employer: [employer]  Tobacco Use   Smoking status: Former    Packs/day: 1.00    Years: 7.00    Pack years: 7.00    Types: Cigarettes    Quit date: 08/31/1983    Years since quitting: 37.8   Smokeless tobacco: Never   Tobacco comments:    smoking  cessation materials not required  Vaping Use   Vaping Use: Never used  Substance and Sexual Activity   Alcohol use: No    Alcohol/week: 0.0 standard drinks   Drug use: No   Sexual activity: Yes    Partners: Female  Other Topics Concern   Not on file  Social History Narrative   Married, working 4 days a week transporting parts   He retired from [employer]   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    Social Determinants of Health   Financial Resource Strain: Not on file  Food Insecurity: Not on file  Transportation Needs: Not on file  Physical Activity: Not on file  Stress: Not on file  Social Connections: Not on file  Intimate Partner Violence: Not on file    Review of Systems: Gen: Denies any fever, chills, fatigue, weight loss, lack of appetite.  CV: Denies chest pain, heart palpitations, peripheral edema, syncope.  Resp: Denies shortness of breath at rest or with exertion. Denies wheezing or cough.  GI: Denies dysphagia or odynophagia. Denies jaundice, hematemesis, fecal incontinence. GU : Denies urinary burning, urinary frequency, urinary hesitancy MS: Denies joint pain, muscle weakness, cramps, or limitation of movement.  Derm: Denies rash, itching, dry skin Psych: Denies depression, anxiety, memory loss, and confusion Heme: Denies bruising, bleeding, and enlarged lymph nodes.  Physical Exam: There were no vitals taken for this visit. General:   Alert and oriented. Pleasant and cooperative. Well-nourished and  well-developed.  Head:  Normocephalic and atraumatic. Eyes:  Without icterus, sclera clear and conjunctiva pink.  Ears:  Normal auditory acuity. Nose:  No deformity, discharge,  or lesions. Mouth:  No deformity or lesions, oral mucosa pink.  Neck:  Supple, without mass or thyromegaly. Lungs:  Clear to auscultation bilaterally. No wheezes, rales, or rhonchi. No distress.  Heart:  S1, S2 present without murmurs appreciated.  Abdomen:  +BS, soft, non-tender and non-distended. No HSM noted. No guarding or rebound. No masses appreciated.  Rectal:  Deferred  Msk:  Symmetrical without gross deformities. Normal posture. Pulses:  Normal pulses noted. Extremities:  Without clubbing or edema. Neurologic:  Alert and  oriented x4;  grossly normal neurologically. Skin:  Intact without significant lesions or rashes. Cervical Nodes:  No significant cervical adenopathy. Psych:  Alert and cooperative. Normal mood and affect.

## 2021-07-13 ENCOUNTER — Encounter: Payer: Self-pay | Admitting: Internal Medicine

## 2021-07-13 ENCOUNTER — Ambulatory Visit: Payer: Medicare HMO | Admitting: Gastroenterology

## 2021-07-29 DIAGNOSIS — E782 Mixed hyperlipidemia: Secondary | ICD-10-CM | POA: Diagnosis not present

## 2021-07-29 DIAGNOSIS — I10 Essential (primary) hypertension: Secondary | ICD-10-CM | POA: Diagnosis not present

## 2021-08-28 DIAGNOSIS — E782 Mixed hyperlipidemia: Secondary | ICD-10-CM | POA: Diagnosis not present

## 2021-08-28 DIAGNOSIS — I10 Essential (primary) hypertension: Secondary | ICD-10-CM | POA: Diagnosis not present

## 2021-09-23 DIAGNOSIS — L02413 Cutaneous abscess of right upper limb: Secondary | ICD-10-CM | POA: Diagnosis not present

## 2021-09-27 DIAGNOSIS — E785 Hyperlipidemia, unspecified: Secondary | ICD-10-CM | POA: Diagnosis not present

## 2021-09-27 DIAGNOSIS — I10 Essential (primary) hypertension: Secondary | ICD-10-CM | POA: Diagnosis not present

## 2021-09-29 DIAGNOSIS — I10 Essential (primary) hypertension: Secondary | ICD-10-CM | POA: Diagnosis not present

## 2021-09-29 DIAGNOSIS — E785 Hyperlipidemia, unspecified: Secondary | ICD-10-CM | POA: Diagnosis not present

## 2021-10-05 DIAGNOSIS — E118 Type 2 diabetes mellitus with unspecified complications: Secondary | ICD-10-CM | POA: Diagnosis not present

## 2021-10-05 DIAGNOSIS — E782 Mixed hyperlipidemia: Secondary | ICD-10-CM | POA: Diagnosis not present

## 2021-10-12 DIAGNOSIS — E559 Vitamin D deficiency, unspecified: Secondary | ICD-10-CM | POA: Diagnosis not present

## 2021-10-12 DIAGNOSIS — I10 Essential (primary) hypertension: Secondary | ICD-10-CM | POA: Diagnosis not present

## 2021-10-12 DIAGNOSIS — K219 Gastro-esophageal reflux disease without esophagitis: Secondary | ICD-10-CM | POA: Diagnosis not present

## 2021-10-12 DIAGNOSIS — E118 Type 2 diabetes mellitus with unspecified complications: Secondary | ICD-10-CM | POA: Diagnosis not present

## 2021-10-12 DIAGNOSIS — I2510 Atherosclerotic heart disease of native coronary artery without angina pectoris: Secondary | ICD-10-CM | POA: Diagnosis not present

## 2021-10-12 DIAGNOSIS — I480 Paroxysmal atrial fibrillation: Secondary | ICD-10-CM | POA: Diagnosis not present

## 2021-10-12 DIAGNOSIS — N401 Enlarged prostate with lower urinary tract symptoms: Secondary | ICD-10-CM | POA: Diagnosis not present

## 2021-10-12 DIAGNOSIS — E782 Mixed hyperlipidemia: Secondary | ICD-10-CM | POA: Diagnosis not present

## 2021-10-12 DIAGNOSIS — M170 Bilateral primary osteoarthritis of knee: Secondary | ICD-10-CM | POA: Diagnosis not present

## 2021-10-27 DIAGNOSIS — I480 Paroxysmal atrial fibrillation: Secondary | ICD-10-CM | POA: Diagnosis not present

## 2021-10-27 DIAGNOSIS — E782 Mixed hyperlipidemia: Secondary | ICD-10-CM | POA: Diagnosis not present

## 2021-10-27 DIAGNOSIS — E118 Type 2 diabetes mellitus with unspecified complications: Secondary | ICD-10-CM | POA: Diagnosis not present

## 2021-10-27 DIAGNOSIS — I10 Essential (primary) hypertension: Secondary | ICD-10-CM | POA: Diagnosis not present

## 2021-11-23 NOTE — Progress Notes (Signed)
? ?Referring Provider: Celene Squibb, MD ?Primary Care Physician:  Celene Squibb, MD ?Primary Gastroenterologist:  Dr. Abbey Chatters ? ?Chief Complaint  ?Patient presents with  ? Colonoscopy  ? ? ?HPI:   ?Darius Grimes is a 76 y.o. male presenting today to discuss scheduling surveillance colonoscopy.  His last colonoscopy was in November 2017 with Dr. Laurance Flatten at the [facility] revealing normal ileum, diverticulosis, no polyps.  He does have history of tubular adenomas in 2007 and 2012.  He was initially a nurse triage visit, but recommended office visit due to medical history. ? ?Today: ?Reports he is doing well overall.  No GI concerns.  Denies abdominal pain, constipation, diarrhea, BRBPR, melena.  He has been intentionally losing weight through healthy diet and routine exercise.  No upper GI symptoms such as reflux symptoms, nausea, vomiting, or dysphagia.  No family history of colon cancer. ? ?Past Medical History:  ?Diagnosis Date  ? Acid reflux   ? Allergic rhinitis   ? Arthritis   ? Coronary artery disease   ? Dysrhythmia   ? Episodic atrial fibrillation (HCC)   ? GERD (gastroesophageal reflux disease)   ? Heart palpitations   ? History of kidney stones   ? Hyperlipidemia   ? Hypertension   ? Kidney stones   ? Neck pain   ? Recurrent nephrolithiasis   ? Skin lesion of back   ? ? ?Past Surgical History:  ?Procedure Laterality Date  ? CARPAL TUNNEL RELEASE Left   ? CATARACT EXTRACTION W/PHACO Left 02/01/2020  ? Procedure: CATARACT EXTRACTION PHACO AND INTRAOCULAR LENS PLACEMENT ([facility]);  Surgeon: Baruch Goldmann, MD;  Location: [facility];  Service: Ophthalmology;  Laterality: Left;  CDE: 8.89  ? CATARACT EXTRACTION W/PHACO Left 02/15/2020  ? Procedure: REMOVAL OF RETAINED LENS FRAGMENTS LEFT EYE;  Surgeon: Baruch Goldmann, MD;  Location: [facility];  Service: Ophthalmology;  Laterality: Left;  ? CATARACT EXTRACTION W/PHACO Right 05/26/2020  ? Procedure: CATARACT EXTRACTION PHACO AND INTRAOCULAR LENS PLACEMENT RIGHT EYE;  Surgeon:  Baruch Goldmann, MD;  Location: [facility];  Service: Ophthalmology;  Laterality: Right;  CDE: 6.38  ? COLONOSCOPY  2007  ? Tubular adenomas  ? COLONOSCOPY  2012  ? Tubular adenomas  ? COLONOSCOPY  2017  ? Normal ileum, diverticulosis, normal rectum, no polyps.  ? CORONARY ARTERY BYPASS GRAFT  2016  ? HYDROCELE EXCISION / REPAIR    ? KNEE ARTHROPLASTY Left 07/04/2017  ? Procedure: COMPUTER ASSISTED TOTAL KNEE ARTHROPLASTY;  Surgeon: Dereck Leep, MD;  Location: [facility];  Service: Orthopedics;  Laterality: Left;  ? THROAT SURGERY    ? URETERAL STENT PLACEMENT    ? at the [place]  ? Vocal cord poylp removal    ? ? ?Current Outpatient Medications  ?Medication Sig Dispense Refill  ? acetaminophen (TYLENOL) 650 MG CR tablet Take 650 mg by mouth as needed for pain.    ? albuterol (VENTOLIN HFA) 108 (90 Base) MCG/ACT inhaler Inhale 1-2 puffs into the lungs as needed for wheezing or shortness of breath.    ? allopurinol (ZYLOPRIM) 300 MG tablet Take 1 tablet (300 mg total) by mouth daily. (Patient taking differently: Take 300 mg by mouth as needed.) 30 tablet 5  ? aspirin EC 81 MG tablet Take 81 mg by mouth daily. Swallow whole.    ? chlorthalidone (HYGROTON) 25 MG tablet Take 25 mg by mouth daily.    ? docusate sodium (COLACE) 100 MG capsule Take 200 mg by mouth as needed.    ?  finasteride (PROSCAR) 5 MG tablet Take 5 mg by mouth daily.    ? fluticasone (FLONASE) 50 MCG/ACT nasal spray Place 2 sprays into both nostrils daily as needed for rhinitis.    ? hydrALAZINE (APRESOLINE) 10 MG tablet Take 10 mg by mouth 3 (three) times daily. Take 1 tablet by mouth daily    ? HYDROcodone-acetaminophen (NORCO/VICODIN) 5-325 MG tablet One tablet every four hours as needed for acute pain.  Limit of five days per [place] statue. (Patient taking differently: 2 (two) times daily. One tablet every four hours as needed for acute pain.  Limit of five days per [place] statue.) 30 tablet 0  ? LORazepam (ATIVAN) 0.5 MG tablet Take 0.5 mg by mouth  at bedtime as needed for sleep.    ? losartan (COZAAR) 100 MG tablet Take 100 mg by mouth daily. Take 1 daily    ? meloxicam (MOBIC) 15 MG tablet Take 15 mg by mouth daily.    ? metoprolol tartrate (LOPRESSOR) 50 MG tablet Take 50 mg by mouth 2 (two) times daily.    ? Multiple Vitamin (MULTIVITAMIN WITH MINERALS) TABS tablet Take 1 tablet by mouth daily.    ? Omega-3 Fatty Acids (FISH OIL) 1200 MG CAPS Take 2 capsules (2,400 mg total) by mouth in the morning and at bedtime. 60 capsule 0  ? Potassium Chloride ER 20 MEQ TBCR Take 40 mEq by mouth 2 (two) times daily.     ? Psyllium (METAMUCIL FIBER PO) Take 1 Dose by mouth as needed. Once to twice daily for regularity    ? rosuvastatin (CRESTOR) 10 MG tablet Take 5 mg by mouth as needed.    ? tamsulosin (FLOMAX) 0.4 MG CAPS capsule Take 0.4 mg by mouth daily.     ? torsemide (DEMADEX) 20 MG tablet Take 40 mg by mouth in the morning and at bedtime.    ? vitamin B-12 (CYANOCOBALAMIN) 1000 MCG tablet Take 2,000 mcg by mouth daily.    ? ?No current facility-administered medications for this visit.  ? ? ?Allergies as of 11/25/2021 - Review Complete 11/25/2021  ?Allergen Reaction Noted  ? Alfuzosin Other (See Comments) 10/04/2014  ? Pravastatin Other (See Comments) 01/07/2009  ? Terazosin Other (See Comments) 10/04/2014  ? ? ?Family History  ?Problem Relation Age of Onset  ? CVA Mother   ? Hypotension Mother   ? CAD Father   ? Heart attack Father   ? Hypertension Sister   ? Hypertension Brother   ? Obesity Brother   ? Colon cancer Neg Hx   ? ? ?Social History  ? ?Socioeconomic History  ? Marital status: Married  ?  Spouse name: [name]  ? Number of children: 2  ? Years of education: Not on file  ? Highest education level: 12th grade  ?Occupational History  ?  Employer: [employer]  ?Tobacco Use  ? Smoking status: Former  ?  Packs/day: 1.00  ?  Years: 7.00  ?  Pack years: 7.00  ?  Types: Cigarettes  ?  Quit date: 08/31/1983  ?  Years since quitting: 38.2  ? Smokeless tobacco:  Never  ? Tobacco comments:  ?  smoking cessation materials not required  ?Vaping Use  ? Vaping Use: Never used  ?Substance and Sexual Activity  ? Alcohol use: No  ?  Alcohol/week: 0.0 standard drinks  ? Drug use: No  ? Sexual activity: Yes  ?  Partners: Female  ?Other Topics Concern  ? Not on file  ?  Social History Narrative  ? Married, working 4 days a week transporting parts  ? He retired from [employer]  ? 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   ? ?Social Determinants of Health  ? ?Financial Resource Strain: Not on file  ?Food Insecurity: Not on file  ?Transportation Needs: Not on file  ?Physical Activity: Not on file  ?Stress: Not on file  ?Social Connections: Not on file  ?Intimate Partner Violence: Not on file  ? ? ?Review of Systems: ?Gen: Denies any fever, chills, cold or flulike symptoms, presyncope, syncope. ?CV: Denies chest pain, heart palpitations. ?Resp: Denies shortness of breath or cough. ?GI: See HPI ?GU : Denies urinary burning, urinary frequency, urinary hesitancy ?MS: Admits to chronic pain in his legs and feet. ?Derm: Denies rash ?Heme: See HPI ? ?Physical Exam: ?BP 131/68   Pulse 80   Temp 97.6 ?F (36.4 ?C) (Temporal)   Ht '5\' 10"'$  (1.778 m)   Wt 251 lb 6.4 oz (114 kg)   BMI 36.07 kg/m?  ?General:   Alert and oriented. Pleasant and cooperative. Well-nourished and well-developed.  ?Head:  Normocephalic and atraumatic. ?Eyes:  Without icterus, sclera clear and conjunctiva pink.  ?Ears:  Normal auditory acuity. ?Lungs:  Clear to auscultation bilaterally. No wheezes, rales, or rhonchi. No distress.  ?Heart: Irregularly irregular rate and rhythm without tachycardia.  No murmurs appreciated. ?Abdomen:  +BS, soft, non-tender and non-distended. No HSM noted. No guarding or rebound. No masses appreciated.  ?Rectal:  Deferred  ?Msk:  Symmetrical without gross deformities. Normal posture. ?Extremities:  With 1+ LE edema. ?Neurologic:  Alert and  oriented x4;  grossly normal  neurologically. ?Skin:  Intact without significant lesions or rashes. ?Psych:  Normal mood and affect. ? ?Labs: ?10/05/2021 (included in referral) ?CMP: Glucose 114 (H), creatinine 1.38 (H), sodium 142, potassium 3.7, calcium 9.9, total p

## 2021-11-25 ENCOUNTER — Ambulatory Visit: Payer: Medicare HMO | Admitting: Gastroenterology

## 2021-11-25 ENCOUNTER — Encounter: Payer: Self-pay | Admitting: Gastroenterology

## 2021-11-25 VITALS — BP 131/68 | HR 80 | Temp 97.6°F | Ht 70.0 in | Wt 251.4 lb

## 2021-11-25 DIAGNOSIS — Z86010 Personal history of colonic polyps: Secondary | ICD-10-CM

## 2021-11-25 NOTE — Patient Instructions (Addendum)
We will arrange for you to have a colonoscopy in the near future with Dr. Abbey Chatters. ? ?We will follow-up with you as needed.  Do not hesitate to call if you have any new GI concerns. ? ?It was very nice meeting you today! ? ?Aliene Altes, PA-C ?[facility] ? ?

## 2021-11-27 DIAGNOSIS — I10 Essential (primary) hypertension: Secondary | ICD-10-CM | POA: Diagnosis not present

## 2021-11-27 DIAGNOSIS — E785 Hyperlipidemia, unspecified: Secondary | ICD-10-CM | POA: Diagnosis not present

## 2021-12-02 ENCOUNTER — Other Ambulatory Visit: Payer: Self-pay

## 2021-12-02 ENCOUNTER — Telehealth: Payer: Self-pay

## 2021-12-02 MED ORDER — PEG 3350-KCL-NA BICARB-NACL 420 G PO SOLR
4000.0000 mL | ORAL | 0 refills | Status: DC
Start: 2021-12-02 — End: 2022-01-08

## 2021-12-02 NOTE — Telephone Encounter (Signed)
Pre-op appt 01/06/22. Appt letter mailed with procedure instructions. ?

## 2021-12-02 NOTE — Telephone Encounter (Signed)
Tried to call pt to schedule TCS, LMOVM for return call. °

## 2021-12-02 NOTE — Telephone Encounter (Signed)
Spoke to pt, TCS scheduled for 01/08/22 at 8:00am. Rx for prep sent to pharmacy. Orders entered. ?

## 2021-12-14 DIAGNOSIS — R509 Fever, unspecified: Secondary | ICD-10-CM | POA: Diagnosis not present

## 2021-12-14 DIAGNOSIS — J069 Acute upper respiratory infection, unspecified: Secondary | ICD-10-CM | POA: Diagnosis not present

## 2021-12-14 DIAGNOSIS — R0989 Other specified symptoms and signs involving the circulatory and respiratory systems: Secondary | ICD-10-CM | POA: Diagnosis not present

## 2021-12-14 DIAGNOSIS — R059 Cough, unspecified: Secondary | ICD-10-CM | POA: Diagnosis not present

## 2021-12-21 DIAGNOSIS — J069 Acute upper respiratory infection, unspecified: Secondary | ICD-10-CM | POA: Diagnosis not present

## 2021-12-27 DIAGNOSIS — E118 Type 2 diabetes mellitus with unspecified complications: Secondary | ICD-10-CM | POA: Diagnosis not present

## 2021-12-27 DIAGNOSIS — I480 Paroxysmal atrial fibrillation: Secondary | ICD-10-CM | POA: Diagnosis not present

## 2021-12-27 DIAGNOSIS — E782 Mixed hyperlipidemia: Secondary | ICD-10-CM | POA: Diagnosis not present

## 2021-12-27 DIAGNOSIS — I10 Essential (primary) hypertension: Secondary | ICD-10-CM | POA: Diagnosis not present

## 2022-01-05 NOTE — Patient Instructions (Signed)
? ? ? ? ? ? Darius Grimes ? 01/05/2022  ?  ? '@PREFPERIOPPHARMACY'$ @ ? ? Your procedure is scheduled on  01/08/2022. ? ? Report to [facility] at  [phone]  A.M. ? ? Call this number if you have problems the morning of surgery: ? [phone] ? ? Remember: ? Follow the diet and prep instructions given to you by the office. ? ?  Use your inhaler before you come and bring your rescue inhaler with you. ? ?  ? Take these medicines the morning of surgery with A SIP OF WATER  ? ?    allopurinol, proscar, hydrocodone or mobic (if needed), metoprolol, flomax. ?  ? ? Do not wear jewelry, make-up or nail polish. ? Do not wear lotions, powders, or perfumes, or deodorant. ? Do not shave 48 hours prior to surgery.  Men may shave face and neck. ? Do not bring valuables to the hospital. ? [facility] is not responsible for any belongings or valuables. ? ?Contacts, dentures or bridgework may not be worn into surgery.  Leave your suitcase in the car.  After surgery it may be brought to your room. ? ?For patients admitted to the hospital, discharge time will be determined by your treatment team. ? ?Patients discharged the day of surgery will not be allowed to drive home and must have someone with them for 24 hours.  ? ? ?Special instructions:   DO NOT smoke tobacco or vape for 24 hours before your procedure. ? ?Please read over the following fact sheets that you were given. ?Anesthesia Post-op Instructions and Care and Recovery After Surgery ?  ? ? ? Colonoscopy, Adult, Care After ?The following information offers guidance on how to care for yourself after your procedure. Your health care provider may also give you more specific instructions. If you have problems or questions, contact your health care provider. ?What can I expect after the procedure? ?After the procedure, it is common to have: ?A small amount of blood in your stool for 24 hours after the procedure. ?Some gas. ?Mild cramping or bloating of your abdomen. ?Follow these  instructions at home: ?Eating and drinking ? ?Drink enough fluid to keep your urine pale yellow. ?Follow instructions from your health care provider about eating or drinking restrictions. ?Resume your normal diet as told by your health care provider. Avoid heavy or fried foods that are hard to digest. ?Activity ?Rest as told by your health care provider. ?Avoid sitting for a long time without moving. Get up to take short walks every 1-2 hours. This is important to improve blood flow and breathing. Ask for help if you feel weak or unsteady. ?Return to your normal activities as told by your health care provider. Ask your health care provider what activities are safe for you. ?Managing cramping and bloating ? ?Try walking around when you have cramps or feel bloated. ?If directed, apply heat to your abdomen as told by your health care provider. Use the heat source that your health care provider recommends, such as a moist heat pack or a heating pad. ?Place a towel between your skin and the heat source. ?Leave the heat on for 20-30 minutes. ?Remove the heat if your skin turns bright red. This is especially important if you are unable to feel pain, heat, or cold. You have a greater risk of getting burned. ?General instructions ?If you were given a sedative during the procedure, it can affect you for several hours. Do not drive or operate  machinery until your health care provider says that it is safe. ?For the first 24 hours after the procedure: ?Do not sign important documents. ?Do not drink alcohol. ?Do your regular daily activities at a slower pace than normal. ?Eat soft foods that are easy to digest. ?Take over-the-counter and prescription medicines only as told by your health care provider. ?Keep all follow-up visits. This is important. ?Contact a health care provider if: ?You have blood in your stool 2-3 days after the procedure. ?Get help right away if: ?You have more than a small spotting of blood in your  stool. ?You have large blood clots in your stool. ?You have swelling of your abdomen. ?You have nausea or vomiting. ?You have a fever. ?You have increasing pain in your abdomen that is not relieved with medicine. ?These symptoms may be an emergency. Get help right away. Call 911. ?Do not wait to see if the symptoms will go away. ?Do not drive yourself to the hospital. ?Summary ?After the procedure, it is common to have a small amount of blood in your stool. You may also have mild cramping and bloating of your abdomen. ?If you were given a sedative during the procedure, it can affect you for several hours. Do not drive or operate machinery until your health care provider says that it is safe. ?Get help right away if you have a lot of blood in your stool, nausea or vomiting, a fever, or increased pain in your abdomen. ?This information is not intended to replace advice given to you by your health care provider. Make sure you discuss any questions you have with your health care provider. ?Document Revised: 04/08/2021 Document Reviewed: 04/08/2021 ?Elsevier Patient Education ? [place]. ?Monitored Anesthesia Care, Care After ?This sheet gives you information about how to care for yourself after your procedure. Your health care provider may also give you more specific instructions. If you have problems or questions, contact your health care provider. ?What can I expect after the procedure? ?After the procedure, it is common to have: ?Tiredness. ?Forgetfulness about what happened after the procedure. ?Impaired judgment for important decisions. ?Nausea or vomiting. ?Some difficulty with balance. ?Follow these instructions at home: ?For the time period you were told by your health care provider: ? ?  ? ?Rest as needed. ?Do not participate in activities where you could fall or become injured. ?Do not drive or use machinery. ?Do not drink alcohol. ?Do not take sleeping pills or medicines that cause drowsiness. ?Do  not make important decisions or sign legal documents. ?Do not take care of children on your own. ?Eating and drinking ?Follow the diet that is recommended by your health care provider. ?Drink enough fluid to keep your urine pale yellow. ?If you vomit: ?Drink water, juice, or soup when you can drink without vomiting. ?Make sure you have little or no nausea before eating solid foods. ?General instructions ?Have a responsible adult stay with you for the time you are told. It is important to have someone help care for you until you are awake and alert. ?Take over-the-counter and prescription medicines only as told by your health care provider. ?If you have sleep apnea, surgery and certain medicines can increase your risk for breathing problems. Follow instructions from your health care provider about wearing your sleep device: ?Anytime you are sleeping, including during daytime naps. ?While taking prescription pain medicines, sleeping medicines, or medicines that make you drowsy. ?Avoid smoking. ?Keep all follow-up visits as told by your health care  provider. This is important. ?Contact a health care provider if: ?You keep feeling nauseous or you keep vomiting. ?You feel light-headed. ?You are still sleepy or having trouble with balance after 24 hours. ?You develop a rash. ?You have a fever. ?You have redness or swelling around the IV site. ?Get help right away if: ?You have trouble breathing. ?You have new-onset confusion at home. ?Summary ?For several hours after your procedure, you may feel tired. You may also be forgetful and have poor judgment. ?Have a responsible adult stay with you for the time you are told. It is important to have someone help care for you until you are awake and alert. ?Rest as told. Do not drive or operate machinery. Do not drink alcohol or take sleeping pills. ?Get help right away if you have trouble breathing, or if you suddenly become confused. ?This information is not intended to replace  advice given to you by your health care provider. Make sure you discuss any questions you have with your health care provider. ?Document Revised: 07/21/2021 Document Reviewed: 07/19/2019 ?Elsevier Patient Education ? [place]

## 2022-01-06 ENCOUNTER — Encounter (HOSPITAL_COMMUNITY)
Admission: RE | Admit: 2022-01-06 | Discharge: 2022-01-06 | Disposition: A | Discharge status: Home / Self Care | Payer: Medicare HMO | Source: Ambulatory Visit | Attending: Internal Medicine | Admitting: Internal Medicine

## 2022-01-06 ENCOUNTER — Encounter (HOSPITAL_COMMUNITY): Payer: Self-pay

## 2022-01-06 DIAGNOSIS — I10 Essential (primary) hypertension: Secondary | ICD-10-CM | POA: Insufficient documentation

## 2022-01-06 DIAGNOSIS — Z79899 Other long term (current) drug therapy: Secondary | ICD-10-CM | POA: Diagnosis not present

## 2022-01-06 DIAGNOSIS — Z01818 Encounter for other preprocedural examination: Secondary | ICD-10-CM | POA: Insufficient documentation

## 2022-01-06 LAB — BASIC METABOLIC PANEL
Anion gap: 8 (ref 5–15)
BUN: 19 mg/dL (ref 8–23)
CO2: 28 mmol/L (ref 22–32)
Calcium: 8.9 mg/dL (ref 8.9–10.3)
Chloride: 104 mmol/L (ref 98–111)
Creatinine, Ser: 1.37 mg/dL — ABNORMAL HIGH (ref 0.61–1.24)
GFR, Estimated: 54 mL/min — ABNORMAL LOW (ref >60)
Glucose, Bld: 115 mg/dL — ABNORMAL HIGH (ref 70–99)
Potassium: 3 mmol/L — ABNORMAL LOW (ref 3.5–5.1)
Sodium: 140 mmol/L (ref 135–145)

## 2022-01-07 NOTE — Progress Notes (Signed)
K+ 3.0 reported to Dr Briant Cedar.  No orders given. ?

## 2022-01-08 ENCOUNTER — Encounter (HOSPITAL_COMMUNITY)
Admission: RE | Disposition: A | Discharge status: Home / Self Care | Payer: Self-pay | Source: Home / Self Care | Attending: Internal Medicine

## 2022-01-08 ENCOUNTER — Ambulatory Visit (HOSPITAL_COMMUNITY)
Admission: RE | Admit: 2022-01-08 | Discharge: 2022-01-08 | Disposition: A | Discharge status: Home / Self Care | Payer: Medicare HMO | Attending: Internal Medicine | Admitting: Internal Medicine

## 2022-01-08 ENCOUNTER — Encounter (HOSPITAL_COMMUNITY): Payer: Self-pay

## 2022-01-08 ENCOUNTER — Ambulatory Visit (HOSPITAL_BASED_OUTPATIENT_CLINIC_OR_DEPARTMENT_OTHER): Payer: Medicare HMO | Admitting: Anesthesiology

## 2022-01-08 ENCOUNTER — Ambulatory Visit (HOSPITAL_COMMUNITY): Payer: Medicare HMO | Admitting: Anesthesiology

## 2022-01-08 DIAGNOSIS — Z1211 Encounter for screening for malignant neoplasm of colon: Secondary | ICD-10-CM | POA: Diagnosis not present

## 2022-01-08 DIAGNOSIS — D120 Benign neoplasm of cecum: Secondary | ICD-10-CM | POA: Insufficient documentation

## 2022-01-08 DIAGNOSIS — I2510 Atherosclerotic heart disease of native coronary artery without angina pectoris: Secondary | ICD-10-CM | POA: Diagnosis not present

## 2022-01-08 DIAGNOSIS — Z87891 Personal history of nicotine dependence: Secondary | ICD-10-CM | POA: Diagnosis not present

## 2022-01-08 DIAGNOSIS — K648 Other hemorrhoids: Secondary | ICD-10-CM

## 2022-01-08 DIAGNOSIS — Z86010 Personal history of colonic polyps: Secondary | ICD-10-CM

## 2022-01-08 DIAGNOSIS — K219 Gastro-esophageal reflux disease without esophagitis: Secondary | ICD-10-CM | POA: Insufficient documentation

## 2022-01-08 DIAGNOSIS — K635 Polyp of colon: Secondary | ICD-10-CM | POA: Diagnosis not present

## 2022-01-08 DIAGNOSIS — I10 Essential (primary) hypertension: Secondary | ICD-10-CM | POA: Insufficient documentation

## 2022-01-08 DIAGNOSIS — K5730 Diverticulosis of large intestine without perforation or abscess without bleeding: Secondary | ICD-10-CM | POA: Insufficient documentation

## 2022-01-08 DIAGNOSIS — D122 Benign neoplasm of ascending colon: Secondary | ICD-10-CM | POA: Diagnosis not present

## 2022-01-08 HISTORY — PX: COLONOSCOPY WITH PROPOFOL: SHX5780

## 2022-01-08 HISTORY — PX: POLYPECTOMY: SHX5525

## 2022-01-08 SURGERY — COLONOSCOPY WITH PROPOFOL
Anesthesia: General

## 2022-01-08 MED ORDER — PROPOFOL 10 MG/ML IV BOLUS
INTRAVENOUS | Status: DC | PRN
  Administered 2022-01-08: 90 mg via INTRAVENOUS

## 2022-01-08 MED ORDER — PROPOFOL 500 MG/50ML IV EMUL
INTRAVENOUS | Status: DC | PRN
  Administered 2022-01-08: 150 ug/kg/min via INTRAVENOUS

## 2022-01-08 MED ORDER — LIDOCAINE HCL (CARDIAC) PF 100 MG/5ML IV SOSY
PREFILLED_SYRINGE | INTRAVENOUS | Status: DC | PRN
  Administered 2022-01-08: 60 mg via INTRATRACHEAL

## 2022-01-08 MED ORDER — PHENYLEPHRINE HCL (PRESSORS) 10 MG/ML IV SOLN
INTRAVENOUS | Status: DC | PRN
  Administered 2022-01-08 (×3): 160 ug via INTRAVENOUS

## 2022-01-08 MED ORDER — PROPOFOL 500 MG/50ML IV EMUL
INTRAVENOUS | Status: AC
  Filled 2022-01-08: qty 50

## 2022-01-08 MED ORDER — LACTATED RINGERS IV SOLN: INTRAVENOUS | Status: DC | PRN

## 2022-01-08 MED ORDER — LIDOCAINE HCL (PF) 2 % IJ SOLN
INTRAMUSCULAR | Status: AC
  Filled 2022-01-08: qty 35

## 2022-01-08 MED ORDER — PROPOFOL 1000 MG/100ML IV EMUL
INTRAVENOUS | Status: AC
  Filled 2022-01-08: qty 300

## 2022-01-08 MED ORDER — PHENYLEPHRINE 80 MCG/ML (10ML) SYRINGE FOR IV PUSH (FOR BLOOD PRESSURE SUPPORT)
PREFILLED_SYRINGE | INTRAVENOUS | Status: AC
  Filled 2022-01-08: qty 10

## 2022-01-08 NOTE — Transfer of Care (Signed)
Immediate Anesthesia Transfer of Care Note ? ?Patient: Darius Grimes ? ?Procedure(s) Performed: COLONOSCOPY WITH PROPOFOL ?POLYPECTOMY ? ?Patient Location: Short Stay ? ?Anesthesia Type:General ? ?Level of Consciousness: sedated ? ?Airway & Oxygen Therapy: Patient Spontanous Breathing ? ?Post-op Assessment: Report given to RN and Post -op Vital signs reviewed and stable ? ?Post vital signs: Reviewed and stable ? ?Last Vitals:  ?Vitals Value Taken Time  ?BP    ?Temp    ?Pulse    ?Resp    ?SpO2    ? ? ?Last Pain:  ?Vitals:  ? 01/08/22 0814  ?TempSrc:   ?PainSc: 5   ?   ? ?Patients Stated Pain Goal: 5 (01/08/22 9784) ? ?Complications: No notable events documented. ?

## 2022-01-08 NOTE — Anesthesia Preprocedure Evaluation (Signed)
Anesthesia Evaluation  ?Patient identified by MRN, date of birth, ID band ?Patient awake ? ? ? ?Reviewed: ?Allergy & Precautions, H&P , NPO status , Patient's Chart, lab work & pertinent test results, reviewed documented beta blocker date and time  ? ?Airway ?Mallampati: II ? ?TM Distance: >3 FB ?Neck ROM: full ? ? ? Dental ?no notable dental hx. ? ?  ?Pulmonary ?neg pulmonary ROS, former smoker,  ?  ?Pulmonary exam normal ?breath sounds clear to auscultation ? ? ? ? ? ? Cardiovascular ?Exercise Tolerance: Good ?hypertension, + CAD  ?+ dysrhythmias  ?Rhythm:regular Rate:Normal ? ? ?  ?Neuro/Psych ? Neuromuscular disease negative psych ROS  ? GI/Hepatic ?Neg liver ROS, GERD  Medicated,  ?Endo/Other  ?negative endocrine ROS ? Renal/GU ?Renal disease  ?negative genitourinary ?  ?Musculoskeletal ? ? Abdominal ?  ?Peds ? Hematology ?negative hematology ROS ?(+)   ?Anesthesia Other Findings ? ? Reproductive/Obstetrics ?negative OB ROS ? ?  ? ? ? ? ? ? ? ? ? ? ? ? ? ?  ?  ? ? ? ? ? ? ? ? ?Anesthesia Physical ?Anesthesia Plan ? ?ASA: 2 ? ?Anesthesia Plan: General  ? ?Post-op Pain Management:   ? ?Induction:  ? ?PONV Risk Score and Plan: Propofol infusion ? ?Airway Management Planned:  ? ?Additional Equipment:  ? ?Intra-op Plan:  ? ?Post-operative Plan:  ? ?Informed Consent: I have reviewed the patients History and Physical, chart, labs and discussed the procedure including the risks, benefits and alternatives for the proposed anesthesia with the patient or authorized representative who has indicated his/her understanding and acceptance.  ? ? ? ?Dental Advisory Given ? ?Plan Discussed with: CRNA ? ?Anesthesia Plan Comments:   ? ? ? ? ? ? ?Anesthesia Quick Evaluation ? ?

## 2022-01-08 NOTE — Discharge Instructions (Addendum)
?  Colonoscopy Discharge Instructions  Read the instructions outlined below and refer to this sheet in the next few weeks. These discharge instructions provide you with general information on caring for yourself after you leave the hospital. Your doctor may also give you specific instructions. While your treatment has been planned according to the most current medical practices available, unavoidable complications occasionally occur.   ACTIVITY You may resume your regular activity, but move at a slower pace for the next 24 hours.  Take frequent rest periods for the next 24 hours.  Walking will help get rid of the air and reduce the bloated feeling in your belly (abdomen).  No driving for 24 hours (because of the medicine (anesthesia) used during the test).   Do not sign any important legal documents or operate any machinery for 24 hours (because of the anesthesia used during the test).  NUTRITION Drink plenty of fluids.  You may resume your normal diet as instructed by your doctor.  Begin with a light meal and progress to your normal diet. Heavy or fried foods are harder to digest and may make you feel sick to your stomach (nauseated).  Avoid alcoholic beverages for 24 hours or as instructed.  MEDICATIONS You may resume your normal medications unless your doctor tells you otherwise.  WHAT YOU CAN EXPECT TODAY Some feelings of bloating in the abdomen.  Passage of more gas than usual.  Spotting of blood in your stool or on the toilet paper.  IF YOU HAD POLYPS REMOVED DURING THE COLONOSCOPY: No aspirin products for 7 days or as instructed.  No alcohol for 7 days or as instructed.  Eat a soft diet for the next 24 hours.  FINDING OUT THE RESULTS OF YOUR TEST Not all test results are available during your visit. If your test results are not back during the visit, make an appointment with your caregiver to find out the results. Do not assume everything is normal if you have not heard from your  caregiver or the medical facility. It is important for you to follow up on all of your test results.  SEEK IMMEDIATE MEDICAL ATTENTION IF: You have more than a spotting of blood in your stool.  Your belly is swollen (abdominal distention).  You are nauseated or vomiting.  You have a temperature over 101.  You have abdominal pain or discomfort that is severe or gets worse throughout the day.   Your colonoscopy revealed 3 polyp(s) which I removed successfully. Await pathology results, my office will contact you. I recommend repeating colonoscopy in 5 years for surveillance purposes if benefits outweigh risks.   You also have diverticulosis and internal hemorrhoids. I would recommend increasing fiber in your diet or adding OTC Benefiber/Metamucil. Be sure to drink at least 4 to 6 glasses of water daily. Follow-up with GI as needed.   I hope you have a great rest of your week!  Charles K. Carver, D.O. Gastroenterology and Hepatology [facility]  

## 2022-01-08 NOTE — Op Note (Signed)
[facility] ?Patient Name: Darius Grimes ?Procedure Date: 01/08/2022 8:09 AM ?MRN: [id number] ?Date of Birth: 1946/04/24 ?Attending MD: Elon Alas. Abbey Chatters , DO ?CSN: [id number] ?Age: 76 ?Admit Type: Outpatient ?Procedure:                Colonoscopy ?Indications:              Surveillance: Personal history of adenomatous  ?                          polyps on last colonoscopy 5 years ago ?Providers:                Elon Alas. Abbey Chatters, DO, Caprice Kluver, Raphael Gibney,  ?                          Technician ?Referring MD:              ?Medicines:                See the Anesthesia note for documentation of the  ?                          administered medications ?Complications:            No immediate complications. ?Estimated Blood Loss:     Estimated blood loss was minimal. ?Procedure:                Pre-Anesthesia Assessment: ?                          - The anesthesia plan was to use monitored  ?                          anesthesia care (MAC). ?                          After obtaining informed consent, the colonoscope  ?                          was passed under direct vision. Throughout the  ?                          procedure, the patient's blood pressure, pulse, and  ?                          oxygen saturations were monitored continuously. The  ?                          PCF-HQ190L ([id number]) scope was introduced through  ?                          the anus and advanced to the the cecum, identified  ?                          by appendiceal orifice and ileocecal valve. The  ?                          colonoscopy was performed without difficulty. The  ?  patient tolerated the procedure well. The quality  ?                          of the bowel preparation was evaluated using the  ?                          BBPS [facility] Bowel Preparation Scale) with scores  ?                          of: Right Colon = 2 (minor amount of residual  ?                          staining, small fragments of stool  and/or opaque  ?                          liquid, but mucosa seen well), Transverse Colon = 2  ?                          (minor amount of residual staining, small fragments  ?                          of stool and/or opaque liquid, but mucosa seen  ?                          well) and Left Colon = 2 (minor amount of residual  ?                          staining, small fragments of stool and/or opaque  ?                          liquid, but mucosa seen well). The total BBPS score  ?                          equals 6. The quality of the bowel preparation was  ?                          fair. ?Scope In: 8:17:00 AM ?Scope Out: 8:32:47 AM ?Scope Withdrawal Time: 0 hours 11 minutes 3 seconds  ?Total Procedure Duration: 0 hours 15 minutes 47 seconds  ?Findings: ?     The perianal and digital rectal examinations were normal. ?     Internal hemorrhoids were found during endoscopy. ?     Multiple small and large-mouthed diverticula were found in the sigmoid  ?     colon and descending colon. ?     Three sessile polyps were found in the ascending colon and cecum. The  ?     polyps were 3 to 4 mm in size. These polyps were removed with a cold  ?     snare. Resection and retrieval were complete. ?     The exam was otherwise without abnormality. ?Impression:               - Preparation of the colon was fair. ?                          -  Internal hemorrhoids. ?                          - Diverticulosis in the sigmoid colon and in the  ?                          descending colon. ?                          - Three 3 to 4 mm polyps in the ascending colon and  ?                          in the cecum, removed with a cold snare. Resected  ?                          and retrieved. ?                          - The examination was otherwise normal. ?Moderate Sedation: ?     Per Anesthesia Care ?Recommendation:           - Patient has a contact number available for  ?                          emergencies. The signs and symptoms of  potential  ?                          delayed complications were discussed with the  ?                          patient. Return to normal activities tomorrow.  ?                          Written discharge instructions were provided to the  ?                          patient. ?                          - Resume previous diet. ?                          - Continue present medications. ?                          - Await pathology results. ?                          - Repeat colonoscopy in 5 years for surveillance. ?                          - Return to GI clinic PRN. ?Procedure Code(s):        --- Professional --- ?                          [phone], Colonoscopy, flexible; with removal of  ?  tumor(s), polyp(s), or other lesion(s) by snare  ?                          technique ?Diagnosis Code(s):        --- Professional --- ?                          Z86.010, Personal history of colonic polyps ?                          K64.8, Other hemorrhoids ?                          K63.5, Polyp of colon ?                          K57.30, Diverticulosis of large intestine without  ?                          perforation or abscess without bleeding ?CPT copyright 2019 American Medical Association. All rights reserved. ?The codes documented in this report are preliminary and upon coder review may  ?be revised to meet current compliance requirements. ?Elon Alas. Abbey Chatters, DO ?Elon Alas. Abbey Chatters, DO ?01/08/2022 8:35:55 AM ?This report has been signed electronically. ?Number of Addenda: 0 ?

## 2022-01-08 NOTE — Anesthesia Postprocedure Evaluation (Signed)
Anesthesia Post Note ? ?Patient: Darius Grimes ? ?Procedure(s) Performed: COLONOSCOPY WITH PROPOFOL ?POLYPECTOMY ? ?Patient location during evaluation: Phase II ?Anesthesia Type: General ?Level of consciousness: awake ?Pain management: pain level controlled ?Vital Signs Assessment: post-procedure vital signs reviewed and stable ?Respiratory status: spontaneous breathing and respiratory function stable ?Cardiovascular status: blood pressure returned to baseline and stable ?Postop Assessment: no headache and no apparent nausea or vomiting ?Anesthetic complications: no ?Comments: Late entry ? ? ?No notable events documented. ? ? ?Last Vitals:  ?Vitals:  ? 01/08/22 0659 01/08/22 0837  ?BP: 119/66 (!) 97/55  ?Pulse: 90 82  ?Resp: 14 18  ?Temp: 36.8 ?C 36.6 ?C  ?SpO2: 92% 97%  ?  ?Last Pain:  ?Vitals:  ? 01/08/22 0837  ?TempSrc: Oral  ?PainSc: 0-No pain  ? ? ?  ?  ?  ?  ?  ?  ? ?Louann Sjogren ? ? ? ? ?

## 2022-01-08 NOTE — H&P (Signed)
Primary Care Physician:  Celene Squibb, MD ?Primary Gastroenterologist:  Dr. Abbey Chatters ? ?Pre-Procedure History & Physical: ?HPI:  Darius Grimes is a 76 y.o. male is here for a colonoscopy to be performed for surveillance purposes, personal history of adenomatous colon polyps. His last colonoscopy was in November 2017 with Dr. Laurance Flatten at the [facility] revealing normal ileum, diverticulosis, no polyps.  He does have history of tubular adenomas in 2007 and 2012 ? ?Past Medical History:  ?Diagnosis Date  ? Acid reflux   ? Allergic rhinitis   ? Arthritis   ? Coronary artery disease   ? Dysrhythmia   ? Episodic atrial fibrillation (HCC)   ? GERD (gastroesophageal reflux disease)   ? Heart palpitations   ? History of kidney stones   ? Hyperlipidemia   ? Hypertension   ? Kidney stones   ? Neck pain   ? Recurrent nephrolithiasis   ? Skin lesion of back   ? ? ?Past Surgical History:  ?Procedure Laterality Date  ? CARPAL TUNNEL RELEASE Left   ? CATARACT EXTRACTION W/PHACO Left 02/01/2020  ? Procedure: CATARACT EXTRACTION PHACO AND INTRAOCULAR LENS PLACEMENT (IOC);  Surgeon: Baruch Goldmann, MD;  [facility]: [facility];  Service: Ophthalmology;  Laterality: Left;  CDE: 8.89  ? CATARACT EXTRACTION W/PHACO Left 02/15/2020  ? Procedure: REMOVAL OF RETAINED LENS FRAGMENTS LEFT EYE;  Surgeon: Baruch Goldmann, MD;  Location: [facility];  Service: Ophthalmology;  Laterality: Left;  ? CATARACT EXTRACTION W/PHACO Right 05/26/2020  ? Procedure: CATARACT EXTRACTION PHACO AND INTRAOCULAR LENS PLACEMENT RIGHT EYE;  Surgeon: Baruch Goldmann, MD;  Location: [facility];  Service: Ophthalmology;  Laterality: Right;  CDE: 6.38  ? COLONOSCOPY  2007  ? Tubular adenomas  ? COLONOSCOPY  2012  ? Tubular adenomas  ? COLONOSCOPY  2017  ? Normal ileum, diverticulosis, normal rectum, no polyps.  ? CORONARY ARTERY BYPASS GRAFT  2016  ? HYDROCELE EXCISION / REPAIR    ? KNEE ARTHROPLASTY Left 07/04/2017  ? Procedure: COMPUTER ASSISTED TOTAL KNEE ARTHROPLASTY;  Surgeon: Dereck Leep, MD;  Location: [facility] ORS;  Service: Orthopedics;  Laterality: Left;  ? THROAT SURGERY    ? URETERAL STENT PLACEMENT    ? at the [place]  ? Vocal cord poylp removal    ? ? ?Prior to Admission medications   ?Medication Sig Start Date End Date Taking? Authorizing Provider  ?acetaminophen (TYLENOL) 650 MG CR tablet Take 650 mg by mouth as needed for pain.   Yes [provider]  ?albuterol (VENTOLIN HFA) 108 (90 Base) MCG/ACT inhaler Inhale 1-2 puffs into the lungs as needed for wheezing or shortness of breath.   Yes [provider]  ?allopurinol (ZYLOPRIM) 300 MG tablet Take 1 tablet (300 mg total) by mouth daily. ?Patient taking differently: Take 300 mg by mouth daily as needed. 11/25/20  Yes Sanjuana Kava, MD  ?amLODipine (NORVASC) 10 MG tablet Take 10 mg by mouth at bedtime.   Yes [provider]  ?chlorthalidone (HYGROTON) 25 MG tablet Take 12.5 mg by mouth daily.   Yes [provider]  ?docusate sodium (COLACE) 100 MG capsule Take 200 mg by mouth daily as needed for moderate constipation.   Yes [provider]  ?finasteride (PROSCAR) 5 MG tablet Take 5 mg by mouth daily.   Yes [provider]  ?fluticasone (FLONASE) 50 MCG/ACT nasal spray Place 2 sprays into both nostrils daily as needed for rhinitis.   Yes [provider]  ?hydrALAZINE (APRESOLINE) 10 MG tablet  Take 10 mg by mouth in the morning and at bedtime.   Yes [provider]  ?HYDROcodone-acetaminophen (NORCO/VICODIN) 5-325 MG tablet One tablet every four hours as needed for acute pain.  Limit of five days per [place]. ?Patient taking differently: Take 1 tablet by mouth 2 (two) times daily. 11/11/20  Yes Sanjuana Kava, MD  ?LORazepam (ATIVAN) 0.5 MG tablet Take 0.5 mg by mouth at bedtime as needed for sleep. 05/08/20  Yes [provider]  ?losartan (COZAAR) 100 MG tablet Take 100 mg by mouth daily. Take 1 daily   Yes [provider]  ?meloxicam (MOBIC) 15 MG tablet  Take 15 mg by mouth daily. 01/07/20  Yes [provider]  ?metoprolol tartrate (LOPRESSOR) 50 MG tablet Take 50 mg by mouth 2 (two) times daily. 10/29/19  Yes [provider]  ?Multiple Vitamin (MULTIVITAMIN WITH MINERALS) TABS tablet Take 1 tablet by mouth daily.   Yes [provider]  ?Omega-3 Fatty Acids (FISH OIL) 1200 MG CAPS Take 2 capsules (2,400 mg total) by mouth in the morning and at bedtime. 02/01/20  Yes Baruch Goldmann, MD  ?polyethylene glycol-electrolytes (TRILYTE) 420 g solution Take 4,000 mLs by mouth as directed. 12/02/21  Yes Eloise Harman, DO  ?Potassium Chloride ER 20 MEQ TBCR Take 20-40 mEq by mouth See admin instructions. Take 40 mEq by mouth in the morning and 20 mEq in the evening 03/06/20  Yes [provider]  ?Psyllium (METAMUCIL FIBER PO) Take 1 Scoop by mouth every other day.   Yes [provider]  ?rosuvastatin (CRESTOR) 10 MG tablet Take 5 mg by mouth at bedtime.   Yes [provider]  ?tamsulosin (FLOMAX) 0.4 MG CAPS capsule Take 0.4 mg by mouth daily.    Yes [provider]  ?torsemide (DEMADEX) 20 MG tablet Take 20-40 mg by mouth See admin instructions. Take 40 mg by mouth in the morning and 20 mg in the evening 11/16/19  Yes [provider]  ?vitamin B-12 (CYANOCOBALAMIN) 1000 MCG tablet Take 2,000 mcg by mouth daily.   Yes [provider]  ?aspirin EC 81 MG tablet Take 81 mg by mouth daily. Swallow whole.    [provider]  ? ? ?Allergies as of 12/02/2021 - Review Complete 11/25/2021  ?Allergen Reaction Noted  ? Alfuzosin Other (See Comments) 10/04/2014  ? Pravastatin Other (See Comments) 01/07/2009  ? Terazosin Other (See Comments) 10/04/2014  ? ? ?Family History  ?Problem Relation Age of Onset  ? CVA Mother   ? Hypotension Mother   ? CAD Father   ? Heart attack Father   ? Hypertension Sister   ? Hypertension Brother   ? Obesity Brother   ? Colon cancer Neg Hx   ? ? ?Social History   ? ?Socioeconomic History  ? Marital status: Married  ?  Spouse name: [name]  ? Number of children: 2  ? Years of education: Not on file  ? Highest education level: 12th grade  ?Occupational History  ?  Employer: [employer]  ?Tobacco Use  ? Smoking status: Former  ?  Packs/day: 1.00  ?  Years: 7.00  ?  Pack years: 7.00  ?  Types: Cigarettes  ?  Quit date: 08/31/1983  ?  Years since quitting: 38.3  ? Smokeless tobacco: Never  ? Tobacco comments:  ?  smoking cessation materials not required  ?Vaping Use  ? Vaping Use: Never used  ?Substance and Sexual Activity  ? Alcohol use: No  ?  Alcohol/week: 0.0 standard drinks  ? Drug use: No  ? Sexual activity: Yes  ?  Partners: Female  ?Other Topics Concern  ? Not on file  ?Social History Narrative  ? Married, working 4 days a week transporting parts  ? He retired from [employer]  ? 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   ? ?Social Determinants of Health  ? ?Financial Resource Strain: Not on file  ?Food Insecurity: Not on file  ?Transportation Needs: Not on file  ?Physical Activity: Not on file  ?Stress: Not on file  ?Social Connections: Not on file  ?Intimate Partner Violence: Not on file  ? ? ?Review of Systems: ?See HPI, otherwise negative ROS ? ?Physical Exam: ?Vital signs in last 24 hours: ?Temp:  [98.2 ?F (36.8 ?C)] 98.2 ?F (36.8 ?C) (05/12 0932) ?Pulse Rate:  [90] 90 (05/12 0659) ?Resp:  [14] 14 (05/12 0659) ?BP: (119)/(66) 119/66 (05/12 6712) ?SpO2:  [92 %] 92 % (05/12 0659) ?Weight:  [107.9 kg] 107.9 kg (05/12 0659) ?  ?General:   Alert,  Well-developed, well-nourished, pleasant and cooperative in NAD ?Head:  Normocephalic and atraumatic. ?Eyes:  Sclera clear, no icterus.   Conjunctiva pink. ?Ears:  Normal auditory acuity. ?Nose:  No deformity, discharge,  or lesions. ?Mouth:  No deformity or lesions, dentition normal. ?Neck:  Supple; no masses or thyromegaly. ?Lungs:  Clear throughout to auscultation.   No wheezes, crackles, or rhonchi. No acute  distress. ?Heart:  Regular rate and rhythm; no murmurs, clicks, rubs,  or gallops. ?Abdomen:  Soft, nontender and nondistended. No masses, hepatosplenomegaly or hernias noted. Normal bowel sounds, without guarding, and without r

## 2022-01-11 LAB — SURGICAL PATHOLOGY

## 2022-01-15 ENCOUNTER — Encounter (HOSPITAL_COMMUNITY): Payer: Self-pay | Admitting: Internal Medicine

## 2022-01-18 ENCOUNTER — Telehealth: Payer: Self-pay | Admitting: Internal Medicine

## 2022-01-18 NOTE — Telephone Encounter (Signed)
Pt LMOM over the weekend that he was returning a call. [phone]

## 2022-01-18 NOTE — Telephone Encounter (Signed)
See result note.  

## 2022-02-11 DIAGNOSIS — E118 Type 2 diabetes mellitus with unspecified complications: Secondary | ICD-10-CM | POA: Diagnosis not present

## 2022-02-11 DIAGNOSIS — E782 Mixed hyperlipidemia: Secondary | ICD-10-CM | POA: Diagnosis not present

## 2022-02-24 DIAGNOSIS — J381 Polyp of vocal cord and larynx: Secondary | ICD-10-CM | POA: Diagnosis not present

## 2022-02-24 DIAGNOSIS — I10 Essential (primary) hypertension: Secondary | ICD-10-CM | POA: Diagnosis not present

## 2022-02-24 DIAGNOSIS — E559 Vitamin D deficiency, unspecified: Secondary | ICD-10-CM | POA: Diagnosis not present

## 2022-02-24 DIAGNOSIS — Z0001 Encounter for general adult medical examination with abnormal findings: Secondary | ICD-10-CM | POA: Diagnosis not present

## 2022-02-24 DIAGNOSIS — I2510 Atherosclerotic heart disease of native coronary artery without angina pectoris: Secondary | ICD-10-CM | POA: Diagnosis not present

## 2022-02-24 DIAGNOSIS — N401 Enlarged prostate with lower urinary tract symptoms: Secondary | ICD-10-CM | POA: Diagnosis not present

## 2022-02-24 DIAGNOSIS — K219 Gastro-esophageal reflux disease without esophagitis: Secondary | ICD-10-CM | POA: Diagnosis not present

## 2022-02-24 DIAGNOSIS — E782 Mixed hyperlipidemia: Secondary | ICD-10-CM | POA: Diagnosis not present

## 2022-02-24 DIAGNOSIS — I480 Paroxysmal atrial fibrillation: Secondary | ICD-10-CM | POA: Diagnosis not present

## 2022-02-24 DIAGNOSIS — M170 Bilateral primary osteoarthritis of knee: Secondary | ICD-10-CM | POA: Diagnosis not present

## 2022-02-24 DIAGNOSIS — E118 Type 2 diabetes mellitus with unspecified complications: Secondary | ICD-10-CM | POA: Diagnosis not present

## 2022-02-26 DIAGNOSIS — E118 Type 2 diabetes mellitus with unspecified complications: Secondary | ICD-10-CM | POA: Diagnosis not present

## 2022-02-26 DIAGNOSIS — I480 Paroxysmal atrial fibrillation: Secondary | ICD-10-CM | POA: Diagnosis not present

## 2022-02-26 DIAGNOSIS — E782 Mixed hyperlipidemia: Secondary | ICD-10-CM | POA: Diagnosis not present

## 2022-02-26 DIAGNOSIS — I10 Essential (primary) hypertension: Secondary | ICD-10-CM | POA: Diagnosis not present

## 2022-04-29 DIAGNOSIS — I480 Paroxysmal atrial fibrillation: Secondary | ICD-10-CM | POA: Diagnosis not present

## 2022-04-29 DIAGNOSIS — E118 Type 2 diabetes mellitus with unspecified complications: Secondary | ICD-10-CM | POA: Diagnosis not present

## 2022-04-29 DIAGNOSIS — I10 Essential (primary) hypertension: Secondary | ICD-10-CM | POA: Diagnosis not present

## 2022-04-29 DIAGNOSIS — E782 Mixed hyperlipidemia: Secondary | ICD-10-CM | POA: Diagnosis not present

## 2022-05-26 DIAGNOSIS — D3131 Benign neoplasm of right choroid: Secondary | ICD-10-CM | POA: Diagnosis not present

## 2022-05-29 DIAGNOSIS — I480 Paroxysmal atrial fibrillation: Secondary | ICD-10-CM | POA: Diagnosis not present

## 2022-05-29 DIAGNOSIS — E782 Mixed hyperlipidemia: Secondary | ICD-10-CM | POA: Diagnosis not present

## 2022-05-29 DIAGNOSIS — I10 Essential (primary) hypertension: Secondary | ICD-10-CM | POA: Diagnosis not present

## 2022-05-29 DIAGNOSIS — E118 Type 2 diabetes mellitus with unspecified complications: Secondary | ICD-10-CM | POA: Diagnosis not present

## 2022-06-18 DIAGNOSIS — E782 Mixed hyperlipidemia: Secondary | ICD-10-CM | POA: Diagnosis not present

## 2022-06-18 DIAGNOSIS — E118 Type 2 diabetes mellitus with unspecified complications: Secondary | ICD-10-CM | POA: Diagnosis not present

## 2022-06-24 DIAGNOSIS — J381 Polyp of vocal cord and larynx: Secondary | ICD-10-CM | POA: Diagnosis not present

## 2022-06-24 DIAGNOSIS — I2510 Atherosclerotic heart disease of native coronary artery without angina pectoris: Secondary | ICD-10-CM | POA: Diagnosis not present

## 2022-06-24 DIAGNOSIS — E559 Vitamin D deficiency, unspecified: Secondary | ICD-10-CM | POA: Diagnosis not present

## 2022-06-24 DIAGNOSIS — N401 Enlarged prostate with lower urinary tract symptoms: Secondary | ICD-10-CM | POA: Diagnosis not present

## 2022-06-24 DIAGNOSIS — I10 Essential (primary) hypertension: Secondary | ICD-10-CM | POA: Diagnosis not present

## 2022-06-24 DIAGNOSIS — E782 Mixed hyperlipidemia: Secondary | ICD-10-CM | POA: Diagnosis not present

## 2022-06-24 DIAGNOSIS — E538 Deficiency of other specified B group vitamins: Secondary | ICD-10-CM | POA: Diagnosis not present

## 2022-06-24 DIAGNOSIS — I480 Paroxysmal atrial fibrillation: Secondary | ICD-10-CM | POA: Diagnosis not present

## 2022-06-24 DIAGNOSIS — K219 Gastro-esophageal reflux disease without esophagitis: Secondary | ICD-10-CM | POA: Diagnosis not present

## 2022-06-24 DIAGNOSIS — M170 Bilateral primary osteoarthritis of knee: Secondary | ICD-10-CM | POA: Diagnosis not present

## 2022-06-24 DIAGNOSIS — E118 Type 2 diabetes mellitus with unspecified complications: Secondary | ICD-10-CM | POA: Diagnosis not present

## 2022-08-31 DIAGNOSIS — R9401 Abnormal electroencephalogram [EEG]: Secondary | ICD-10-CM | POA: Diagnosis not present

## 2022-08-31 DIAGNOSIS — E8729 Other acidosis: Secondary | ICD-10-CM | POA: Diagnosis not present

## 2022-08-31 DIAGNOSIS — Z7409 Other reduced mobility: Secondary | ICD-10-CM | POA: Diagnosis not present

## 2022-08-31 DIAGNOSIS — A4152 Sepsis due to Pseudomonas: Secondary | ICD-10-CM | POA: Diagnosis not present

## 2022-08-31 DIAGNOSIS — R0689 Other abnormalities of breathing: Secondary | ICD-10-CM | POA: Diagnosis not present

## 2022-08-31 DIAGNOSIS — I10 Essential (primary) hypertension: Secondary | ICD-10-CM | POA: Diagnosis not present

## 2022-08-31 DIAGNOSIS — B957 Other staphylococcus as the cause of diseases classified elsewhere: Secondary | ICD-10-CM | POA: Diagnosis not present

## 2022-08-31 DIAGNOSIS — Y846 Urinary catheterization as the cause of abnormal reaction of the patient, or of later complication, without mention of misadventure at the time of the procedure: Secondary | ICD-10-CM | POA: Diagnosis not present

## 2022-08-31 DIAGNOSIS — S299XXA Unspecified injury of thorax, initial encounter: Secondary | ICD-10-CM | POA: Diagnosis not present

## 2022-08-31 DIAGNOSIS — G9600 Cerebrospinal fluid leak, unspecified: Secondary | ICD-10-CM | POA: Diagnosis not present

## 2022-08-31 DIAGNOSIS — R258 Other abnormal involuntary movements: Secondary | ICD-10-CM | POA: Diagnosis not present

## 2022-08-31 DIAGNOSIS — I6202 Nontraumatic subacute subdural hemorrhage: Secondary | ICD-10-CM | POA: Diagnosis not present

## 2022-08-31 DIAGNOSIS — R6339 Other feeding difficulties: Secondary | ICD-10-CM | POA: Diagnosis not present

## 2022-08-31 DIAGNOSIS — G479 Sleep disorder, unspecified: Secondary | ICD-10-CM | POA: Diagnosis not present

## 2022-08-31 DIAGNOSIS — I4519 Other right bundle-branch block: Secondary | ICD-10-CM | POA: Diagnosis not present

## 2022-08-31 DIAGNOSIS — M4802 Spinal stenosis, cervical region: Secondary | ICD-10-CM | POA: Diagnosis not present

## 2022-08-31 DIAGNOSIS — J9602 Acute respiratory failure with hypercapnia: Secondary | ICD-10-CM | POA: Diagnosis not present

## 2022-08-31 DIAGNOSIS — I452 Bifascicular block: Secondary | ICD-10-CM | POA: Diagnosis not present

## 2022-08-31 DIAGNOSIS — S0003XA Contusion of scalp, initial encounter: Secondary | ICD-10-CM | POA: Diagnosis not present

## 2022-08-31 DIAGNOSIS — R571 Hypovolemic shock: Secondary | ICD-10-CM | POA: Diagnosis not present

## 2022-08-31 DIAGNOSIS — S062X9D Diffuse traumatic brain injury with loss of consciousness of unspecified duration, subsequent encounter: Secondary | ICD-10-CM | POA: Diagnosis not present

## 2022-08-31 DIAGNOSIS — N200 Calculus of kidney: Secondary | ICD-10-CM | POA: Diagnosis not present

## 2022-08-31 DIAGNOSIS — Z9911 Dependence on respirator [ventilator] status: Secondary | ICD-10-CM | POA: Diagnosis not present

## 2022-08-31 DIAGNOSIS — J9690 Respiratory failure, unspecified, unspecified whether with hypoxia or hypercapnia: Secondary | ICD-10-CM | POA: Diagnosis not present

## 2022-08-31 DIAGNOSIS — S12490A Other displaced fracture of fifth cervical vertebra, initial encounter for closed fracture: Secondary | ICD-10-CM | POA: Diagnosis not present

## 2022-08-31 DIAGNOSIS — J811 Chronic pulmonary edema: Secondary | ICD-10-CM | POA: Diagnosis not present

## 2022-08-31 DIAGNOSIS — S22059A Unspecified fracture of T5-T6 vertebra, initial encounter for closed fracture: Secondary | ICD-10-CM | POA: Diagnosis not present

## 2022-08-31 DIAGNOSIS — R339 Retention of urine, unspecified: Secondary | ICD-10-CM | POA: Diagnosis not present

## 2022-08-31 DIAGNOSIS — S3991XA Unspecified injury of abdomen, initial encounter: Secondary | ICD-10-CM | POA: Diagnosis not present

## 2022-08-31 DIAGNOSIS — Z79899 Other long term (current) drug therapy: Secondary | ICD-10-CM | POA: Diagnosis not present

## 2022-08-31 DIAGNOSIS — S0011XA Contusion of right eyelid and periocular area, initial encounter: Secondary | ICD-10-CM | POA: Diagnosis not present

## 2022-08-31 DIAGNOSIS — S0630AA Unspecified focal traumatic brain injury with loss of consciousness status unknown, initial encounter: Secondary | ICD-10-CM | POA: Diagnosis not present

## 2022-08-31 DIAGNOSIS — K2970 Gastritis, unspecified, without bleeding: Secondary | ICD-10-CM | POA: Diagnosis not present

## 2022-08-31 DIAGNOSIS — G9340 Encephalopathy, unspecified: Secondary | ICD-10-CM | POA: Diagnosis not present

## 2022-08-31 DIAGNOSIS — S06310A Contusion and laceration of right cerebrum without loss of consciousness, initial encounter: Secondary | ICD-10-CM | POA: Diagnosis not present

## 2022-08-31 DIAGNOSIS — I2510 Atherosclerotic heart disease of native coronary artery without angina pectoris: Secondary | ICD-10-CM | POA: Diagnosis not present

## 2022-08-31 DIAGNOSIS — J9601 Acute respiratory failure with hypoxia: Secondary | ICD-10-CM | POA: Diagnosis not present

## 2022-08-31 DIAGNOSIS — S065X1A Traumatic subdural hemorrhage with loss of consciousness of 30 minutes or less, initial encounter: Secondary | ICD-10-CM | POA: Diagnosis not present

## 2022-08-31 DIAGNOSIS — Z452 Encounter for adjustment and management of vascular access device: Secondary | ICD-10-CM | POA: Diagnosis not present

## 2022-08-31 DIAGNOSIS — S3993XA Unspecified injury of pelvis, initial encounter: Secondary | ICD-10-CM | POA: Diagnosis not present

## 2022-08-31 DIAGNOSIS — S12301A Unspecified nondisplaced fracture of fourth cervical vertebra, initial encounter for closed fracture: Secondary | ICD-10-CM | POA: Diagnosis not present

## 2022-08-31 DIAGNOSIS — R509 Fever, unspecified: Secondary | ICD-10-CM | POA: Diagnosis not present

## 2022-08-31 DIAGNOSIS — S062XAA Diffuse traumatic brain injury with loss of consciousness status unknown, initial encounter: Secondary | ICD-10-CM | POA: Diagnosis not present

## 2022-08-31 DIAGNOSIS — S069X0S Unspecified intracranial injury without loss of consciousness, sequela: Secondary | ICD-10-CM | POA: Diagnosis not present

## 2022-08-31 DIAGNOSIS — R4189 Other symptoms and signs involving cognitive functions and awareness: Secondary | ICD-10-CM | POA: Diagnosis not present

## 2022-08-31 DIAGNOSIS — R69 Illness, unspecified: Secondary | ICD-10-CM | POA: Diagnosis not present

## 2022-08-31 DIAGNOSIS — S12390A Other displaced fracture of fourth cervical vertebra, initial encounter for closed fracture: Secondary | ICD-10-CM | POA: Diagnosis not present

## 2022-08-31 DIAGNOSIS — R918 Other nonspecific abnormal finding of lung field: Secondary | ICD-10-CM | POA: Diagnosis not present

## 2022-08-31 DIAGNOSIS — G9389 Other specified disorders of brain: Secondary | ICD-10-CM | POA: Diagnosis not present

## 2022-08-31 DIAGNOSIS — J9600 Acute respiratory failure, unspecified whether with hypoxia or hypercapnia: Secondary | ICD-10-CM | POA: Diagnosis not present

## 2022-08-31 DIAGNOSIS — R413 Other amnesia: Secondary | ICD-10-CM | POA: Diagnosis not present

## 2022-08-31 DIAGNOSIS — Z931 Gastrostomy status: Secondary | ICD-10-CM | POA: Diagnosis not present

## 2022-08-31 DIAGNOSIS — R451 Restlessness and agitation: Secondary | ICD-10-CM | POA: Diagnosis not present

## 2022-08-31 DIAGNOSIS — S06350A Traumatic hemorrhage of left cerebrum without loss of consciousness, initial encounter: Secondary | ICD-10-CM | POA: Diagnosis not present

## 2022-08-31 DIAGNOSIS — I6502 Occlusion and stenosis of left vertebral artery: Secondary | ICD-10-CM | POA: Diagnosis not present

## 2022-08-31 DIAGNOSIS — M795 Residual foreign body in soft tissue: Secondary | ICD-10-CM | POA: Diagnosis not present

## 2022-08-31 DIAGNOSIS — S065XAA Traumatic subdural hemorrhage with loss of consciousness status unknown, initial encounter: Secondary | ICD-10-CM | POA: Diagnosis not present

## 2022-08-31 DIAGNOSIS — R410 Disorientation, unspecified: Secondary | ICD-10-CM | POA: Diagnosis not present

## 2022-08-31 DIAGNOSIS — S0240CD Maxillary fracture, right side, subsequent encounter for fracture with routine healing: Secondary | ICD-10-CM | POA: Diagnosis not present

## 2022-08-31 DIAGNOSIS — Z66 Do not resuscitate: Secondary | ICD-10-CM | POA: Diagnosis not present

## 2022-08-31 DIAGNOSIS — S069XAA Unspecified intracranial injury with loss of consciousness status unknown, initial encounter: Secondary | ICD-10-CM | POA: Diagnosis not present

## 2022-08-31 DIAGNOSIS — R0602 Shortness of breath: Secondary | ICD-10-CM | POA: Diagnosis not present

## 2022-08-31 DIAGNOSIS — E8770 Fluid overload, unspecified: Secondary | ICD-10-CM | POA: Diagnosis not present

## 2022-08-31 DIAGNOSIS — Z430 Encounter for attention to tracheostomy: Secondary | ICD-10-CM | POA: Diagnosis not present

## 2022-08-31 DIAGNOSIS — R29890 Loss of height: Secondary | ICD-10-CM | POA: Diagnosis not present

## 2022-08-31 DIAGNOSIS — R000 Tachycardia, unspecified: Secondary | ICD-10-CM | POA: Diagnosis not present

## 2022-08-31 DIAGNOSIS — R0902 Hypoxemia: Secondary | ICD-10-CM | POA: Diagnosis not present

## 2022-08-31 DIAGNOSIS — S12401A Unspecified nondisplaced fracture of fifth cervical vertebra, initial encounter for closed fracture: Secondary | ICD-10-CM | POA: Diagnosis not present

## 2022-08-31 DIAGNOSIS — S0292XA Unspecified fracture of facial bones, initial encounter for closed fracture: Secondary | ICD-10-CM | POA: Diagnosis not present

## 2022-08-31 DIAGNOSIS — S12300A Unspecified displaced fracture of fourth cervical vertebra, initial encounter for closed fracture: Secondary | ICD-10-CM | POA: Diagnosis not present

## 2022-08-31 DIAGNOSIS — S12400A Unspecified displaced fracture of fifth cervical vertebra, initial encounter for closed fracture: Secondary | ICD-10-CM | POA: Diagnosis not present

## 2022-08-31 DIAGNOSIS — S0083XA Contusion of other part of head, initial encounter: Secondary | ICD-10-CM | POA: Diagnosis not present

## 2022-08-31 DIAGNOSIS — I6200 Nontraumatic subdural hemorrhage, unspecified: Secondary | ICD-10-CM | POA: Diagnosis not present

## 2022-08-31 DIAGNOSIS — I4510 Unspecified right bundle-branch block: Secondary | ICD-10-CM | POA: Diagnosis not present

## 2022-08-31 DIAGNOSIS — R895 Abnormal microbiological findings in specimens from other organs, systems and tissues: Secondary | ICD-10-CM | POA: Diagnosis not present

## 2022-08-31 DIAGNOSIS — J9811 Atelectasis: Secondary | ICD-10-CM | POA: Diagnosis not present

## 2022-08-31 DIAGNOSIS — I7774 Dissection of vertebral artery: Secondary | ICD-10-CM | POA: Diagnosis not present

## 2022-08-31 DIAGNOSIS — K5900 Constipation, unspecified: Secondary | ICD-10-CM | POA: Diagnosis not present

## 2022-08-31 DIAGNOSIS — S0231XA Fracture of orbital floor, right side, initial encounter for closed fracture: Secondary | ICD-10-CM | POA: Diagnosis not present

## 2022-08-31 DIAGNOSIS — S02841A Fracture of lateral orbital wall, right side, initial encounter for closed fracture: Secondary | ICD-10-CM | POA: Diagnosis not present

## 2022-08-31 DIAGNOSIS — Z930 Tracheostomy status: Secondary | ICD-10-CM | POA: Diagnosis not present

## 2022-08-31 DIAGNOSIS — S0240CA Maxillary fracture, right side, initial encounter for closed fracture: Secondary | ICD-10-CM | POA: Diagnosis not present

## 2022-08-31 DIAGNOSIS — D72829 Elevated white blood cell count, unspecified: Secondary | ICD-10-CM | POA: Diagnosis not present

## 2022-08-31 DIAGNOSIS — R0600 Dyspnea, unspecified: Secondary | ICD-10-CM | POA: Diagnosis not present

## 2022-08-31 DIAGNOSIS — S065X9D Traumatic subdural hemorrhage with loss of consciousness of unspecified duration, subsequent encounter: Secondary | ICD-10-CM | POA: Diagnosis not present

## 2022-08-31 DIAGNOSIS — K2980 Duodenitis without bleeding: Secondary | ICD-10-CM | POA: Diagnosis not present

## 2022-08-31 DIAGNOSIS — Z9189 Other specified personal risk factors, not elsewhere classified: Secondary | ICD-10-CM | POA: Diagnosis not present

## 2022-08-31 DIAGNOSIS — E870 Hyperosmolality and hypernatremia: Secondary | ICD-10-CM | POA: Diagnosis not present

## 2022-08-31 DIAGNOSIS — Z9981 Dependence on supplemental oxygen: Secondary | ICD-10-CM | POA: Diagnosis not present

## 2022-08-31 DIAGNOSIS — S062X0A Diffuse traumatic brain injury without loss of consciousness, initial encounter: Secondary | ICD-10-CM | POA: Diagnosis not present

## 2022-08-31 DIAGNOSIS — Z20822 Contact with and (suspected) exposure to covid-19: Secondary | ICD-10-CM | POA: Diagnosis not present

## 2022-08-31 DIAGNOSIS — R609 Edema, unspecified: Secondary | ICD-10-CM | POA: Diagnosis not present

## 2022-08-31 DIAGNOSIS — G931 Anoxic brain damage, not elsewhere classified: Secondary | ICD-10-CM | POA: Diagnosis not present

## 2022-08-31 DIAGNOSIS — S22058A Other fracture of T5-T6 vertebra, initial encounter for closed fracture: Secondary | ICD-10-CM | POA: Diagnosis not present

## 2022-08-31 DIAGNOSIS — Z7982 Long term (current) use of aspirin: Secondary | ICD-10-CM | POA: Diagnosis not present

## 2022-08-31 DIAGNOSIS — S0181XA Laceration without foreign body of other part of head, initial encounter: Secondary | ICD-10-CM | POA: Diagnosis not present

## 2022-08-31 DIAGNOSIS — M47812 Spondylosis without myelopathy or radiculopathy, cervical region: Secondary | ICD-10-CM | POA: Diagnosis not present

## 2022-08-31 DIAGNOSIS — Z789 Other specified health status: Secondary | ICD-10-CM | POA: Diagnosis not present

## 2022-08-31 DIAGNOSIS — S15192A Other specified injury of left vertebral artery, initial encounter: Secondary | ICD-10-CM | POA: Diagnosis not present

## 2022-08-31 DIAGNOSIS — R9431 Abnormal electrocardiogram [ECG] [EKG]: Secondary | ICD-10-CM | POA: Diagnosis not present

## 2022-08-31 DIAGNOSIS — S062XAD Diffuse traumatic brain injury with loss of consciousness status unknown, subsequent encounter: Secondary | ICD-10-CM | POA: Diagnosis not present

## 2022-08-31 DIAGNOSIS — R0989 Other specified symptoms and signs involving the circulatory and respiratory systems: Secondary | ICD-10-CM | POA: Diagnosis not present

## 2022-08-31 DIAGNOSIS — Z4682 Encounter for fitting and adjustment of non-vascular catheter: Secondary | ICD-10-CM | POA: Diagnosis not present

## 2022-08-31 DIAGNOSIS — R633 Feeding difficulties, unspecified: Secondary | ICD-10-CM | POA: Diagnosis not present

## 2022-08-31 DIAGNOSIS — K2990 Gastroduodenitis, unspecified, without bleeding: Secondary | ICD-10-CM | POA: Diagnosis not present

## 2022-08-31 DIAGNOSIS — G8911 Acute pain due to trauma: Secondary | ICD-10-CM | POA: Diagnosis not present

## 2022-08-31 DIAGNOSIS — R0603 Acute respiratory distress: Secondary | ICD-10-CM | POA: Diagnosis not present

## 2022-08-31 DIAGNOSIS — Z9181 History of falling: Secondary | ICD-10-CM | POA: Diagnosis not present

## 2022-08-31 DIAGNOSIS — S12390D Other displaced fracture of fourth cervical vertebra, subsequent encounter for fracture with routine healing: Secondary | ICD-10-CM | POA: Diagnosis not present

## 2022-08-31 DIAGNOSIS — F05 Delirium due to known physiological condition: Secondary | ICD-10-CM | POA: Diagnosis not present

## 2022-08-31 DIAGNOSIS — A419 Sepsis, unspecified organism: Secondary | ICD-10-CM | POA: Diagnosis not present

## 2022-08-31 DIAGNOSIS — R400 Somnolence: Secondary | ICD-10-CM | POA: Diagnosis not present

## 2022-08-31 DIAGNOSIS — S069XAS Unspecified intracranial injury with loss of consciousness status unknown, sequela: Secondary | ICD-10-CM | POA: Diagnosis not present

## 2022-08-31 DIAGNOSIS — G4736 Sleep related hypoventilation in conditions classified elsewhere: Secondary | ICD-10-CM | POA: Diagnosis not present

## 2022-08-31 DIAGNOSIS — K921 Melena: Secondary | ICD-10-CM | POA: Diagnosis not present

## 2022-08-31 DIAGNOSIS — Z951 Presence of aortocoronary bypass graft: Secondary | ICD-10-CM | POA: Diagnosis not present

## 2022-08-31 DIAGNOSIS — R6521 Severe sepsis with septic shock: Secondary | ICD-10-CM | POA: Diagnosis not present

## 2022-08-31 DIAGNOSIS — J90 Pleural effusion, not elsewhere classified: Secondary | ICD-10-CM | POA: Diagnosis not present

## 2022-08-31 DIAGNOSIS — R1310 Dysphagia, unspecified: Secondary | ICD-10-CM | POA: Diagnosis not present

## 2022-08-31 DIAGNOSIS — S065X0A Traumatic subdural hemorrhage without loss of consciousness, initial encounter: Secondary | ICD-10-CM | POA: Diagnosis not present

## 2022-08-31 DIAGNOSIS — Z515 Encounter for palliative care: Secondary | ICD-10-CM | POA: Diagnosis not present

## 2022-08-31 DIAGNOSIS — S3992XA Unspecified injury of lower back, initial encounter: Secondary | ICD-10-CM | POA: Diagnosis not present

## 2022-08-31 DIAGNOSIS — R4182 Altered mental status, unspecified: Secondary | ICD-10-CM | POA: Diagnosis not present

## 2022-08-31 DIAGNOSIS — E871 Hypo-osmolality and hyponatremia: Secondary | ICD-10-CM | POA: Diagnosis not present

## 2022-08-31 DIAGNOSIS — G629 Polyneuropathy, unspecified: Secondary | ICD-10-CM | POA: Diagnosis not present

## 2022-08-31 DIAGNOSIS — Z1810 Retained metal fragments, unspecified: Secondary | ICD-10-CM | POA: Diagnosis not present

## 2022-08-31 DIAGNOSIS — M4316 Spondylolisthesis, lumbar region: Secondary | ICD-10-CM | POA: Diagnosis not present

## 2022-08-31 DIAGNOSIS — D62 Acute posthemorrhagic anemia: Secondary | ICD-10-CM | POA: Diagnosis not present

## 2022-08-31 DIAGNOSIS — I4891 Unspecified atrial fibrillation: Secondary | ICD-10-CM | POA: Diagnosis not present

## 2022-08-31 DIAGNOSIS — S069X0A Unspecified intracranial injury without loss of consciousness, initial encounter: Secondary | ICD-10-CM | POA: Diagnosis not present

## 2022-08-31 DIAGNOSIS — R9089 Other abnormal findings on diagnostic imaging of central nervous system: Secondary | ICD-10-CM | POA: Diagnosis not present

## 2022-08-31 DIAGNOSIS — Z041 Encounter for examination and observation following transport accident: Secondary | ICD-10-CM | POA: Diagnosis not present

## 2022-08-31 DIAGNOSIS — I6203 Nontraumatic chronic subdural hemorrhage: Secondary | ICD-10-CM | POA: Diagnosis not present

## 2022-08-31 DIAGNOSIS — S12490D Other displaced fracture of fifth cervical vertebra, subsequent encounter for fracture with routine healing: Secondary | ICD-10-CM | POA: Diagnosis not present

## 2022-09-01 DIAGNOSIS — R9431 Abnormal electrocardiogram [ECG] [EKG]: Secondary | ICD-10-CM | POA: Diagnosis not present

## 2022-09-01 DIAGNOSIS — R4182 Altered mental status, unspecified: Secondary | ICD-10-CM | POA: Diagnosis not present

## 2022-09-01 DIAGNOSIS — S065XAA Traumatic subdural hemorrhage with loss of consciousness status unknown, initial encounter: Secondary | ICD-10-CM | POA: Diagnosis not present

## 2022-09-01 DIAGNOSIS — S06310A Contusion and laceration of right cerebrum without loss of consciousness, initial encounter: Secondary | ICD-10-CM | POA: Diagnosis not present

## 2022-09-01 DIAGNOSIS — R69 Illness, unspecified: Secondary | ICD-10-CM | POA: Diagnosis not present

## 2022-09-01 DIAGNOSIS — S15192A Other specified injury of left vertebral artery, initial encounter: Secondary | ICD-10-CM | POA: Diagnosis not present

## 2022-09-01 DIAGNOSIS — S062XAA Diffuse traumatic brain injury with loss of consciousness status unknown, initial encounter: Secondary | ICD-10-CM | POA: Diagnosis not present

## 2022-09-02 DIAGNOSIS — R000 Tachycardia, unspecified: Secondary | ICD-10-CM | POA: Diagnosis not present

## 2022-09-02 DIAGNOSIS — J9811 Atelectasis: Secondary | ICD-10-CM | POA: Diagnosis not present

## 2022-09-02 DIAGNOSIS — S065XAA Traumatic subdural hemorrhage with loss of consciousness status unknown, initial encounter: Secondary | ICD-10-CM | POA: Diagnosis not present

## 2022-09-02 DIAGNOSIS — S062XAA Diffuse traumatic brain injury with loss of consciousness status unknown, initial encounter: Secondary | ICD-10-CM | POA: Diagnosis not present

## 2022-09-02 DIAGNOSIS — R4182 Altered mental status, unspecified: Secondary | ICD-10-CM | POA: Diagnosis not present

## 2022-09-02 DIAGNOSIS — R0902 Hypoxemia: Secondary | ICD-10-CM | POA: Diagnosis not present

## 2022-09-03 DIAGNOSIS — R000 Tachycardia, unspecified: Secondary | ICD-10-CM | POA: Diagnosis not present

## 2022-09-03 DIAGNOSIS — R9431 Abnormal electrocardiogram [ECG] [EKG]: Secondary | ICD-10-CM | POA: Diagnosis not present

## 2022-09-03 DIAGNOSIS — S065XAA Traumatic subdural hemorrhage with loss of consciousness status unknown, initial encounter: Secondary | ICD-10-CM | POA: Diagnosis not present

## 2022-09-03 DIAGNOSIS — R4182 Altered mental status, unspecified: Secondary | ICD-10-CM | POA: Diagnosis not present

## 2022-09-03 DIAGNOSIS — Z4682 Encounter for fitting and adjustment of non-vascular catheter: Secondary | ICD-10-CM | POA: Diagnosis not present

## 2022-09-03 DIAGNOSIS — Z452 Encounter for adjustment and management of vascular access device: Secondary | ICD-10-CM | POA: Diagnosis not present

## 2022-09-03 DIAGNOSIS — S062XAA Diffuse traumatic brain injury with loss of consciousness status unknown, initial encounter: Secondary | ICD-10-CM | POA: Diagnosis not present

## 2022-09-03 DIAGNOSIS — J9811 Atelectasis: Secondary | ICD-10-CM | POA: Diagnosis not present

## 2022-09-04 DIAGNOSIS — S062XAA Diffuse traumatic brain injury with loss of consciousness status unknown, initial encounter: Secondary | ICD-10-CM | POA: Diagnosis not present

## 2022-09-04 DIAGNOSIS — R4182 Altered mental status, unspecified: Secondary | ICD-10-CM | POA: Diagnosis not present

## 2022-09-04 DIAGNOSIS — R410 Disorientation, unspecified: Secondary | ICD-10-CM | POA: Diagnosis not present

## 2022-09-04 DIAGNOSIS — R000 Tachycardia, unspecified: Secondary | ICD-10-CM | POA: Diagnosis not present

## 2022-09-04 DIAGNOSIS — R0902 Hypoxemia: Secondary | ICD-10-CM | POA: Diagnosis not present

## 2022-09-04 DIAGNOSIS — S065XAA Traumatic subdural hemorrhage with loss of consciousness status unknown, initial encounter: Secondary | ICD-10-CM | POA: Diagnosis not present

## 2022-09-05 DIAGNOSIS — R451 Restlessness and agitation: Secondary | ICD-10-CM | POA: Diagnosis not present

## 2022-09-05 DIAGNOSIS — R0689 Other abnormalities of breathing: Secondary | ICD-10-CM | POA: Diagnosis not present

## 2022-09-05 DIAGNOSIS — R0902 Hypoxemia: Secondary | ICD-10-CM | POA: Diagnosis not present

## 2022-09-05 DIAGNOSIS — J9811 Atelectasis: Secondary | ICD-10-CM | POA: Diagnosis not present

## 2022-09-05 DIAGNOSIS — R410 Disorientation, unspecified: Secondary | ICD-10-CM | POA: Diagnosis not present

## 2022-09-05 DIAGNOSIS — R9431 Abnormal electrocardiogram [ECG] [EKG]: Secondary | ICD-10-CM | POA: Diagnosis not present

## 2022-09-05 DIAGNOSIS — S12490A Other displaced fracture of fifth cervical vertebra, initial encounter for closed fracture: Secondary | ICD-10-CM | POA: Diagnosis not present

## 2022-09-05 DIAGNOSIS — S069X0A Unspecified intracranial injury without loss of consciousness, initial encounter: Secondary | ICD-10-CM | POA: Diagnosis not present

## 2022-09-05 DIAGNOSIS — S062X0A Diffuse traumatic brain injury without loss of consciousness, initial encounter: Secondary | ICD-10-CM | POA: Diagnosis not present

## 2022-09-05 DIAGNOSIS — I4510 Unspecified right bundle-branch block: Secondary | ICD-10-CM | POA: Diagnosis not present

## 2022-09-05 DIAGNOSIS — S12390A Other displaced fracture of fourth cervical vertebra, initial encounter for closed fracture: Secondary | ICD-10-CM | POA: Diagnosis not present

## 2022-09-05 DIAGNOSIS — I4891 Unspecified atrial fibrillation: Secondary | ICD-10-CM | POA: Diagnosis not present

## 2022-09-06 DIAGNOSIS — R410 Disorientation, unspecified: Secondary | ICD-10-CM | POA: Diagnosis not present

## 2022-09-06 DIAGNOSIS — S12390A Other displaced fracture of fourth cervical vertebra, initial encounter for closed fracture: Secondary | ICD-10-CM | POA: Diagnosis not present

## 2022-09-06 DIAGNOSIS — R0989 Other specified symptoms and signs involving the circulatory and respiratory systems: Secondary | ICD-10-CM | POA: Diagnosis not present

## 2022-09-06 DIAGNOSIS — R509 Fever, unspecified: Secondary | ICD-10-CM | POA: Diagnosis not present

## 2022-09-06 DIAGNOSIS — S069X0A Unspecified intracranial injury without loss of consciousness, initial encounter: Secondary | ICD-10-CM | POA: Diagnosis not present

## 2022-09-06 DIAGNOSIS — R69 Illness, unspecified: Secondary | ICD-10-CM | POA: Diagnosis not present

## 2022-09-06 DIAGNOSIS — R9431 Abnormal electrocardiogram [ECG] [EKG]: Secondary | ICD-10-CM | POA: Diagnosis not present

## 2022-09-06 DIAGNOSIS — R0689 Other abnormalities of breathing: Secondary | ICD-10-CM | POA: Diagnosis not present

## 2022-09-06 DIAGNOSIS — S062X0A Diffuse traumatic brain injury without loss of consciousness, initial encounter: Secondary | ICD-10-CM | POA: Diagnosis not present

## 2022-09-06 DIAGNOSIS — S12490A Other displaced fracture of fifth cervical vertebra, initial encounter for closed fracture: Secondary | ICD-10-CM | POA: Diagnosis not present

## 2022-09-07 DIAGNOSIS — R0902 Hypoxemia: Secondary | ICD-10-CM | POA: Diagnosis not present

## 2022-09-07 DIAGNOSIS — I4891 Unspecified atrial fibrillation: Secondary | ICD-10-CM | POA: Diagnosis not present

## 2022-09-07 DIAGNOSIS — S062X0A Diffuse traumatic brain injury without loss of consciousness, initial encounter: Secondary | ICD-10-CM | POA: Diagnosis not present

## 2022-09-07 DIAGNOSIS — S12390A Other displaced fracture of fourth cervical vertebra, initial encounter for closed fracture: Secondary | ICD-10-CM | POA: Diagnosis not present

## 2022-09-07 DIAGNOSIS — S0003XA Contusion of scalp, initial encounter: Secondary | ICD-10-CM | POA: Diagnosis not present

## 2022-09-07 DIAGNOSIS — S12401A Unspecified nondisplaced fracture of fifth cervical vertebra, initial encounter for closed fracture: Secondary | ICD-10-CM | POA: Diagnosis not present

## 2022-09-07 DIAGNOSIS — S12490A Other displaced fracture of fifth cervical vertebra, initial encounter for closed fracture: Secondary | ICD-10-CM | POA: Diagnosis not present

## 2022-09-07 DIAGNOSIS — R0689 Other abnormalities of breathing: Secondary | ICD-10-CM | POA: Diagnosis not present

## 2022-09-08 DIAGNOSIS — R410 Disorientation, unspecified: Secondary | ICD-10-CM | POA: Diagnosis not present

## 2022-09-08 DIAGNOSIS — I6502 Occlusion and stenosis of left vertebral artery: Secondary | ICD-10-CM | POA: Diagnosis not present

## 2022-09-08 DIAGNOSIS — S12490A Other displaced fracture of fifth cervical vertebra, initial encounter for closed fracture: Secondary | ICD-10-CM | POA: Diagnosis not present

## 2022-09-08 DIAGNOSIS — S12390A Other displaced fracture of fourth cervical vertebra, initial encounter for closed fracture: Secondary | ICD-10-CM | POA: Diagnosis not present

## 2022-09-08 DIAGNOSIS — S062X0A Diffuse traumatic brain injury without loss of consciousness, initial encounter: Secondary | ICD-10-CM | POA: Diagnosis not present

## 2022-09-08 DIAGNOSIS — S065X0A Traumatic subdural hemorrhage without loss of consciousness, initial encounter: Secondary | ICD-10-CM | POA: Diagnosis not present

## 2022-09-08 DIAGNOSIS — S069X0A Unspecified intracranial injury without loss of consciousness, initial encounter: Secondary | ICD-10-CM | POA: Diagnosis not present

## 2022-09-08 DIAGNOSIS — R69 Illness, unspecified: Secondary | ICD-10-CM | POA: Diagnosis not present

## 2022-09-09 DIAGNOSIS — Z4682 Encounter for fitting and adjustment of non-vascular catheter: Secondary | ICD-10-CM | POA: Diagnosis not present

## 2022-09-09 DIAGNOSIS — S065X0A Traumatic subdural hemorrhage without loss of consciousness, initial encounter: Secondary | ICD-10-CM | POA: Diagnosis not present

## 2022-09-09 DIAGNOSIS — S062X0A Diffuse traumatic brain injury without loss of consciousness, initial encounter: Secondary | ICD-10-CM | POA: Diagnosis not present

## 2022-09-09 DIAGNOSIS — Z452 Encounter for adjustment and management of vascular access device: Secondary | ICD-10-CM | POA: Diagnosis not present

## 2022-09-09 DIAGNOSIS — S12390A Other displaced fracture of fourth cervical vertebra, initial encounter for closed fracture: Secondary | ICD-10-CM | POA: Diagnosis not present

## 2022-09-09 DIAGNOSIS — S12490A Other displaced fracture of fifth cervical vertebra, initial encounter for closed fracture: Secondary | ICD-10-CM | POA: Diagnosis not present

## 2022-09-09 DIAGNOSIS — R9431 Abnormal electrocardiogram [ECG] [EKG]: Secondary | ICD-10-CM | POA: Diagnosis not present

## 2022-09-10 DIAGNOSIS — S065X0A Traumatic subdural hemorrhage without loss of consciousness, initial encounter: Secondary | ICD-10-CM | POA: Diagnosis not present

## 2022-09-10 DIAGNOSIS — S12390A Other displaced fracture of fourth cervical vertebra, initial encounter for closed fracture: Secondary | ICD-10-CM | POA: Diagnosis not present

## 2022-09-10 DIAGNOSIS — Z4682 Encounter for fitting and adjustment of non-vascular catheter: Secondary | ICD-10-CM | POA: Diagnosis not present

## 2022-09-10 DIAGNOSIS — R9431 Abnormal electrocardiogram [ECG] [EKG]: Secondary | ICD-10-CM | POA: Diagnosis not present

## 2022-09-10 DIAGNOSIS — R410 Disorientation, unspecified: Secondary | ICD-10-CM | POA: Diagnosis not present

## 2022-09-10 DIAGNOSIS — S12400A Unspecified displaced fracture of fifth cervical vertebra, initial encounter for closed fracture: Secondary | ICD-10-CM | POA: Diagnosis not present

## 2022-09-10 DIAGNOSIS — R69 Illness, unspecified: Secondary | ICD-10-CM | POA: Diagnosis not present

## 2022-09-10 DIAGNOSIS — S062X0A Diffuse traumatic brain injury without loss of consciousness, initial encounter: Secondary | ICD-10-CM | POA: Diagnosis not present

## 2022-09-10 DIAGNOSIS — S12490A Other displaced fracture of fifth cervical vertebra, initial encounter for closed fracture: Secondary | ICD-10-CM | POA: Diagnosis not present

## 2022-09-10 DIAGNOSIS — Z452 Encounter for adjustment and management of vascular access device: Secondary | ICD-10-CM | POA: Diagnosis not present

## 2022-09-10 DIAGNOSIS — S12300A Unspecified displaced fracture of fourth cervical vertebra, initial encounter for closed fracture: Secondary | ICD-10-CM | POA: Diagnosis not present

## 2022-09-11 DIAGNOSIS — S062X0A Diffuse traumatic brain injury without loss of consciousness, initial encounter: Secondary | ICD-10-CM | POA: Diagnosis not present

## 2022-09-11 DIAGNOSIS — S12490A Other displaced fracture of fifth cervical vertebra, initial encounter for closed fracture: Secondary | ICD-10-CM | POA: Diagnosis not present

## 2022-09-11 DIAGNOSIS — S065X0A Traumatic subdural hemorrhage without loss of consciousness, initial encounter: Secondary | ICD-10-CM | POA: Diagnosis not present

## 2022-09-11 DIAGNOSIS — S12390A Other displaced fracture of fourth cervical vertebra, initial encounter for closed fracture: Secondary | ICD-10-CM | POA: Diagnosis not present

## 2022-09-12 DIAGNOSIS — S062XAA Diffuse traumatic brain injury with loss of consciousness status unknown, initial encounter: Secondary | ICD-10-CM | POA: Diagnosis not present

## 2022-09-12 DIAGNOSIS — S12390A Other displaced fracture of fourth cervical vertebra, initial encounter for closed fracture: Secondary | ICD-10-CM | POA: Diagnosis not present

## 2022-09-12 DIAGNOSIS — Z452 Encounter for adjustment and management of vascular access device: Secondary | ICD-10-CM | POA: Diagnosis not present

## 2022-09-12 DIAGNOSIS — S12401A Unspecified nondisplaced fracture of fifth cervical vertebra, initial encounter for closed fracture: Secondary | ICD-10-CM | POA: Diagnosis not present

## 2022-09-12 DIAGNOSIS — Z4682 Encounter for fitting and adjustment of non-vascular catheter: Secondary | ICD-10-CM | POA: Diagnosis not present

## 2022-09-12 DIAGNOSIS — S065X0A Traumatic subdural hemorrhage without loss of consciousness, initial encounter: Secondary | ICD-10-CM | POA: Diagnosis not present

## 2022-09-12 DIAGNOSIS — S12490A Other displaced fracture of fifth cervical vertebra, initial encounter for closed fracture: Secondary | ICD-10-CM | POA: Diagnosis not present

## 2022-09-12 DIAGNOSIS — S062X0A Diffuse traumatic brain injury without loss of consciousness, initial encounter: Secondary | ICD-10-CM | POA: Diagnosis not present

## 2022-09-13 DIAGNOSIS — S12490A Other displaced fracture of fifth cervical vertebra, initial encounter for closed fracture: Secondary | ICD-10-CM | POA: Diagnosis not present

## 2022-09-13 DIAGNOSIS — R69 Illness, unspecified: Secondary | ICD-10-CM | POA: Diagnosis not present

## 2022-09-13 DIAGNOSIS — S069X0A Unspecified intracranial injury without loss of consciousness, initial encounter: Secondary | ICD-10-CM | POA: Diagnosis not present

## 2022-09-13 DIAGNOSIS — S062X0A Diffuse traumatic brain injury without loss of consciousness, initial encounter: Secondary | ICD-10-CM | POA: Diagnosis not present

## 2022-09-13 DIAGNOSIS — S065X0A Traumatic subdural hemorrhage without loss of consciousness, initial encounter: Secondary | ICD-10-CM | POA: Diagnosis not present

## 2022-09-13 DIAGNOSIS — R400 Somnolence: Secondary | ICD-10-CM | POA: Diagnosis not present

## 2022-09-13 DIAGNOSIS — S12390A Other displaced fracture of fourth cervical vertebra, initial encounter for closed fracture: Secondary | ICD-10-CM | POA: Diagnosis not present

## 2022-09-13 DIAGNOSIS — R410 Disorientation, unspecified: Secondary | ICD-10-CM | POA: Diagnosis not present

## 2022-09-14 DIAGNOSIS — S062X0A Diffuse traumatic brain injury without loss of consciousness, initial encounter: Secondary | ICD-10-CM | POA: Diagnosis not present

## 2022-09-14 DIAGNOSIS — G931 Anoxic brain damage, not elsewhere classified: Secondary | ICD-10-CM | POA: Diagnosis not present

## 2022-09-14 DIAGNOSIS — S065X0A Traumatic subdural hemorrhage without loss of consciousness, initial encounter: Secondary | ICD-10-CM | POA: Diagnosis not present

## 2022-09-14 DIAGNOSIS — R69 Illness, unspecified: Secondary | ICD-10-CM | POA: Diagnosis not present

## 2022-09-14 DIAGNOSIS — S12490A Other displaced fracture of fifth cervical vertebra, initial encounter for closed fracture: Secondary | ICD-10-CM | POA: Diagnosis not present

## 2022-09-14 DIAGNOSIS — S0240CD Maxillary fracture, right side, subsequent encounter for fracture with routine healing: Secondary | ICD-10-CM | POA: Diagnosis not present

## 2022-09-14 DIAGNOSIS — E870 Hyperosmolality and hypernatremia: Secondary | ICD-10-CM | POA: Diagnosis not present

## 2022-09-14 DIAGNOSIS — S12390A Other displaced fracture of fourth cervical vertebra, initial encounter for closed fracture: Secondary | ICD-10-CM | POA: Diagnosis not present

## 2022-09-14 DIAGNOSIS — S065X9D Traumatic subdural hemorrhage with loss of consciousness of unspecified duration, subsequent encounter: Secondary | ICD-10-CM | POA: Diagnosis not present

## 2022-09-14 DIAGNOSIS — S12490D Other displaced fracture of fifth cervical vertebra, subsequent encounter for fracture with routine healing: Secondary | ICD-10-CM | POA: Diagnosis not present

## 2022-09-14 DIAGNOSIS — R410 Disorientation, unspecified: Secondary | ICD-10-CM | POA: Diagnosis not present

## 2022-09-14 DIAGNOSIS — S12390D Other displaced fracture of fourth cervical vertebra, subsequent encounter for fracture with routine healing: Secondary | ICD-10-CM | POA: Diagnosis not present

## 2022-09-15 DIAGNOSIS — R9431 Abnormal electrocardiogram [ECG] [EKG]: Secondary | ICD-10-CM | POA: Diagnosis not present

## 2022-09-15 DIAGNOSIS — S12490A Other displaced fracture of fifth cervical vertebra, initial encounter for closed fracture: Secondary | ICD-10-CM | POA: Diagnosis not present

## 2022-09-15 DIAGNOSIS — S12390A Other displaced fracture of fourth cervical vertebra, initial encounter for closed fracture: Secondary | ICD-10-CM | POA: Diagnosis not present

## 2022-09-15 DIAGNOSIS — R410 Disorientation, unspecified: Secondary | ICD-10-CM | POA: Diagnosis not present

## 2022-09-15 DIAGNOSIS — S065X0A Traumatic subdural hemorrhage without loss of consciousness, initial encounter: Secondary | ICD-10-CM | POA: Diagnosis not present

## 2022-09-15 DIAGNOSIS — S062X0A Diffuse traumatic brain injury without loss of consciousness, initial encounter: Secondary | ICD-10-CM | POA: Diagnosis not present

## 2022-09-15 DIAGNOSIS — S0011XA Contusion of right eyelid and periocular area, initial encounter: Secondary | ICD-10-CM | POA: Diagnosis not present

## 2022-09-15 DIAGNOSIS — S069X0A Unspecified intracranial injury without loss of consciousness, initial encounter: Secondary | ICD-10-CM | POA: Diagnosis not present

## 2022-09-15 DIAGNOSIS — S0240CA Maxillary fracture, right side, initial encounter for closed fracture: Secondary | ICD-10-CM | POA: Diagnosis not present

## 2022-09-15 DIAGNOSIS — R451 Restlessness and agitation: Secondary | ICD-10-CM | POA: Diagnosis not present

## 2022-09-16 DIAGNOSIS — R69 Illness, unspecified: Secondary | ICD-10-CM | POA: Diagnosis not present

## 2022-09-16 DIAGNOSIS — S12390D Other displaced fracture of fourth cervical vertebra, subsequent encounter for fracture with routine healing: Secondary | ICD-10-CM | POA: Diagnosis not present

## 2022-09-16 DIAGNOSIS — S12390A Other displaced fracture of fourth cervical vertebra, initial encounter for closed fracture: Secondary | ICD-10-CM | POA: Diagnosis not present

## 2022-09-16 DIAGNOSIS — S0240CD Maxillary fracture, right side, subsequent encounter for fracture with routine healing: Secondary | ICD-10-CM | POA: Diagnosis not present

## 2022-09-16 DIAGNOSIS — S0240CA Maxillary fracture, right side, initial encounter for closed fracture: Secondary | ICD-10-CM | POA: Diagnosis not present

## 2022-09-16 DIAGNOSIS — G931 Anoxic brain damage, not elsewhere classified: Secondary | ICD-10-CM | POA: Diagnosis not present

## 2022-09-16 DIAGNOSIS — S065X9D Traumatic subdural hemorrhage with loss of consciousness of unspecified duration, subsequent encounter: Secondary | ICD-10-CM | POA: Diagnosis not present

## 2022-09-16 DIAGNOSIS — S12490D Other displaced fracture of fifth cervical vertebra, subsequent encounter for fracture with routine healing: Secondary | ICD-10-CM | POA: Diagnosis not present

## 2022-09-16 DIAGNOSIS — S062XAA Diffuse traumatic brain injury with loss of consciousness status unknown, initial encounter: Secondary | ICD-10-CM | POA: Diagnosis not present

## 2022-09-16 DIAGNOSIS — S065XAA Traumatic subdural hemorrhage with loss of consciousness status unknown, initial encounter: Secondary | ICD-10-CM | POA: Diagnosis not present

## 2022-09-16 DIAGNOSIS — R410 Disorientation, unspecified: Secondary | ICD-10-CM | POA: Diagnosis not present

## 2022-09-16 DIAGNOSIS — S12401A Unspecified nondisplaced fracture of fifth cervical vertebra, initial encounter for closed fracture: Secondary | ICD-10-CM | POA: Diagnosis not present

## 2022-09-16 DIAGNOSIS — R0602 Shortness of breath: Secondary | ICD-10-CM | POA: Diagnosis not present

## 2022-09-17 DIAGNOSIS — I6203 Nontraumatic chronic subdural hemorrhage: Secondary | ICD-10-CM | POA: Diagnosis not present

## 2022-09-17 DIAGNOSIS — R258 Other abnormal involuntary movements: Secondary | ICD-10-CM | POA: Diagnosis not present

## 2022-09-17 DIAGNOSIS — I6200 Nontraumatic subdural hemorrhage, unspecified: Secondary | ICD-10-CM | POA: Diagnosis not present

## 2022-09-17 DIAGNOSIS — J9602 Acute respiratory failure with hypercapnia: Secondary | ICD-10-CM | POA: Diagnosis not present

## 2022-09-17 DIAGNOSIS — G8911 Acute pain due to trauma: Secondary | ICD-10-CM | POA: Diagnosis not present

## 2022-09-17 DIAGNOSIS — E8729 Other acidosis: Secondary | ICD-10-CM | POA: Diagnosis not present

## 2022-09-17 DIAGNOSIS — S062XAA Diffuse traumatic brain injury with loss of consciousness status unknown, initial encounter: Secondary | ICD-10-CM | POA: Diagnosis not present

## 2022-09-17 DIAGNOSIS — Z4682 Encounter for fitting and adjustment of non-vascular catheter: Secondary | ICD-10-CM | POA: Diagnosis not present

## 2022-09-17 DIAGNOSIS — I6502 Occlusion and stenosis of left vertebral artery: Secondary | ICD-10-CM | POA: Diagnosis not present

## 2022-09-17 DIAGNOSIS — G9600 Cerebrospinal fluid leak, unspecified: Secondary | ICD-10-CM | POA: Diagnosis not present

## 2022-09-17 DIAGNOSIS — R0902 Hypoxemia: Secondary | ICD-10-CM | POA: Diagnosis not present

## 2022-09-17 DIAGNOSIS — R918 Other nonspecific abnormal finding of lung field: Secondary | ICD-10-CM | POA: Diagnosis not present

## 2022-09-17 DIAGNOSIS — R69 Illness, unspecified: Secondary | ICD-10-CM | POA: Diagnosis not present

## 2022-09-17 DIAGNOSIS — J9811 Atelectasis: Secondary | ICD-10-CM | POA: Diagnosis not present

## 2022-09-17 DIAGNOSIS — R4182 Altered mental status, unspecified: Secondary | ICD-10-CM | POA: Diagnosis not present

## 2022-09-17 DIAGNOSIS — R9431 Abnormal electrocardiogram [ECG] [EKG]: Secondary | ICD-10-CM | POA: Diagnosis not present

## 2022-09-17 DIAGNOSIS — Z9911 Dependence on respirator [ventilator] status: Secondary | ICD-10-CM | POA: Diagnosis not present

## 2022-09-18 DIAGNOSIS — Z4682 Encounter for fitting and adjustment of non-vascular catheter: Secondary | ICD-10-CM | POA: Diagnosis not present

## 2022-09-18 DIAGNOSIS — I6200 Nontraumatic subdural hemorrhage, unspecified: Secondary | ICD-10-CM | POA: Diagnosis not present

## 2022-09-18 DIAGNOSIS — R258 Other abnormal involuntary movements: Secondary | ICD-10-CM | POA: Diagnosis not present

## 2022-09-18 DIAGNOSIS — J9600 Acute respiratory failure, unspecified whether with hypoxia or hypercapnia: Secondary | ICD-10-CM | POA: Diagnosis not present

## 2022-09-18 DIAGNOSIS — S062XAA Diffuse traumatic brain injury with loss of consciousness status unknown, initial encounter: Secondary | ICD-10-CM | POA: Diagnosis not present

## 2022-09-18 DIAGNOSIS — Z9911 Dependence on respirator [ventilator] status: Secondary | ICD-10-CM | POA: Diagnosis not present

## 2022-09-18 DIAGNOSIS — R69 Illness, unspecified: Secondary | ICD-10-CM | POA: Diagnosis not present

## 2022-09-18 DIAGNOSIS — G8911 Acute pain due to trauma: Secondary | ICD-10-CM | POA: Diagnosis not present

## 2022-09-19 DIAGNOSIS — I4891 Unspecified atrial fibrillation: Secondary | ICD-10-CM | POA: Diagnosis not present

## 2022-09-19 DIAGNOSIS — R69 Illness, unspecified: Secondary | ICD-10-CM | POA: Diagnosis not present

## 2022-09-19 DIAGNOSIS — S062XAA Diffuse traumatic brain injury with loss of consciousness status unknown, initial encounter: Secondary | ICD-10-CM | POA: Diagnosis not present

## 2022-09-19 DIAGNOSIS — J9600 Acute respiratory failure, unspecified whether with hypoxia or hypercapnia: Secondary | ICD-10-CM | POA: Diagnosis not present

## 2022-09-19 DIAGNOSIS — J9811 Atelectasis: Secondary | ICD-10-CM | POA: Diagnosis not present

## 2022-09-19 DIAGNOSIS — J90 Pleural effusion, not elsewhere classified: Secondary | ICD-10-CM | POA: Diagnosis not present

## 2022-09-19 DIAGNOSIS — S12401A Unspecified nondisplaced fracture of fifth cervical vertebra, initial encounter for closed fracture: Secondary | ICD-10-CM | POA: Diagnosis not present

## 2022-09-19 DIAGNOSIS — G8911 Acute pain due to trauma: Secondary | ICD-10-CM | POA: Diagnosis not present

## 2022-09-19 DIAGNOSIS — Z9911 Dependence on respirator [ventilator] status: Secondary | ICD-10-CM | POA: Diagnosis not present

## 2022-09-20 DIAGNOSIS — R4182 Altered mental status, unspecified: Secondary | ICD-10-CM | POA: Diagnosis not present

## 2022-09-20 DIAGNOSIS — Z452 Encounter for adjustment and management of vascular access device: Secondary | ICD-10-CM | POA: Diagnosis not present

## 2022-09-20 DIAGNOSIS — R571 Hypovolemic shock: Secondary | ICD-10-CM | POA: Diagnosis not present

## 2022-09-20 DIAGNOSIS — J90 Pleural effusion, not elsewhere classified: Secondary | ICD-10-CM | POA: Diagnosis not present

## 2022-09-20 DIAGNOSIS — J9811 Atelectasis: Secondary | ICD-10-CM | POA: Diagnosis not present

## 2022-09-20 DIAGNOSIS — Z4682 Encounter for fitting and adjustment of non-vascular catheter: Secondary | ICD-10-CM | POA: Diagnosis not present

## 2022-09-20 DIAGNOSIS — Z9911 Dependence on respirator [ventilator] status: Secondary | ICD-10-CM | POA: Diagnosis not present

## 2022-09-20 DIAGNOSIS — R451 Restlessness and agitation: Secondary | ICD-10-CM | POA: Diagnosis not present

## 2022-09-20 DIAGNOSIS — I4891 Unspecified atrial fibrillation: Secondary | ICD-10-CM | POA: Diagnosis not present

## 2022-09-20 DIAGNOSIS — R410 Disorientation, unspecified: Secondary | ICD-10-CM | POA: Diagnosis not present

## 2022-09-20 DIAGNOSIS — J9600 Acute respiratory failure, unspecified whether with hypoxia or hypercapnia: Secondary | ICD-10-CM | POA: Diagnosis not present

## 2022-09-20 DIAGNOSIS — I6202 Nontraumatic subacute subdural hemorrhage: Secondary | ICD-10-CM | POA: Diagnosis not present

## 2022-09-20 DIAGNOSIS — Z79899 Other long term (current) drug therapy: Secondary | ICD-10-CM | POA: Diagnosis not present

## 2022-09-20 DIAGNOSIS — S0003XA Contusion of scalp, initial encounter: Secondary | ICD-10-CM | POA: Diagnosis not present

## 2022-09-21 DIAGNOSIS — R451 Restlessness and agitation: Secondary | ICD-10-CM | POA: Diagnosis not present

## 2022-09-21 DIAGNOSIS — R9431 Abnormal electrocardiogram [ECG] [EKG]: Secondary | ICD-10-CM | POA: Diagnosis not present

## 2022-09-21 DIAGNOSIS — I6502 Occlusion and stenosis of left vertebral artery: Secondary | ICD-10-CM | POA: Diagnosis not present

## 2022-09-21 DIAGNOSIS — R571 Hypovolemic shock: Secondary | ICD-10-CM | POA: Diagnosis not present

## 2022-09-21 DIAGNOSIS — G479 Sleep disorder, unspecified: Secondary | ICD-10-CM | POA: Diagnosis not present

## 2022-09-21 DIAGNOSIS — S0003XA Contusion of scalp, initial encounter: Secondary | ICD-10-CM | POA: Diagnosis not present

## 2022-09-21 DIAGNOSIS — S069X0A Unspecified intracranial injury without loss of consciousness, initial encounter: Secondary | ICD-10-CM | POA: Diagnosis not present

## 2022-09-21 DIAGNOSIS — B957 Other staphylococcus as the cause of diseases classified elsewhere: Secondary | ICD-10-CM | POA: Diagnosis not present

## 2022-09-21 DIAGNOSIS — J9600 Acute respiratory failure, unspecified whether with hypoxia or hypercapnia: Secondary | ICD-10-CM | POA: Diagnosis not present

## 2022-09-21 DIAGNOSIS — I4891 Unspecified atrial fibrillation: Secondary | ICD-10-CM | POA: Diagnosis not present

## 2022-09-21 DIAGNOSIS — I6200 Nontraumatic subdural hemorrhage, unspecified: Secondary | ICD-10-CM | POA: Diagnosis not present

## 2022-09-21 DIAGNOSIS — R9089 Other abnormal findings on diagnostic imaging of central nervous system: Secondary | ICD-10-CM | POA: Diagnosis not present

## 2022-09-21 DIAGNOSIS — I7774 Dissection of vertebral artery: Secondary | ICD-10-CM | POA: Diagnosis not present

## 2022-09-21 DIAGNOSIS — R410 Disorientation, unspecified: Secondary | ICD-10-CM | POA: Diagnosis not present

## 2022-09-21 DIAGNOSIS — R4182 Altered mental status, unspecified: Secondary | ICD-10-CM | POA: Diagnosis not present

## 2022-09-21 DIAGNOSIS — R0902 Hypoxemia: Secondary | ICD-10-CM | POA: Diagnosis not present

## 2022-09-21 DIAGNOSIS — Z4682 Encounter for fitting and adjustment of non-vascular catheter: Secondary | ICD-10-CM | POA: Diagnosis not present

## 2022-09-21 DIAGNOSIS — J9811 Atelectasis: Secondary | ICD-10-CM | POA: Diagnosis not present

## 2022-09-21 DIAGNOSIS — R0603 Acute respiratory distress: Secondary | ICD-10-CM | POA: Diagnosis not present

## 2022-09-22 DIAGNOSIS — R571 Hypovolemic shock: Secondary | ICD-10-CM | POA: Diagnosis not present

## 2022-09-22 DIAGNOSIS — J9811 Atelectasis: Secondary | ICD-10-CM | POA: Diagnosis not present

## 2022-09-22 DIAGNOSIS — R4182 Altered mental status, unspecified: Secondary | ICD-10-CM | POA: Diagnosis not present

## 2022-09-22 DIAGNOSIS — B957 Other staphylococcus as the cause of diseases classified elsewhere: Secondary | ICD-10-CM | POA: Diagnosis not present

## 2022-09-22 DIAGNOSIS — I4891 Unspecified atrial fibrillation: Secondary | ICD-10-CM | POA: Diagnosis not present

## 2022-09-22 DIAGNOSIS — R0902 Hypoxemia: Secondary | ICD-10-CM | POA: Diagnosis not present

## 2022-09-22 DIAGNOSIS — R9431 Abnormal electrocardiogram [ECG] [EKG]: Secondary | ICD-10-CM | POA: Diagnosis not present

## 2022-09-22 DIAGNOSIS — J9600 Acute respiratory failure, unspecified whether with hypoxia or hypercapnia: Secondary | ICD-10-CM | POA: Diagnosis not present

## 2022-09-23 DIAGNOSIS — Z515 Encounter for palliative care: Secondary | ICD-10-CM | POA: Diagnosis not present

## 2022-09-23 DIAGNOSIS — R69 Illness, unspecified: Secondary | ICD-10-CM | POA: Diagnosis not present

## 2022-09-23 DIAGNOSIS — D72829 Elevated white blood cell count, unspecified: Secondary | ICD-10-CM | POA: Diagnosis not present

## 2022-09-23 DIAGNOSIS — I4891 Unspecified atrial fibrillation: Secondary | ICD-10-CM | POA: Diagnosis not present

## 2022-09-23 DIAGNOSIS — J90 Pleural effusion, not elsewhere classified: Secondary | ICD-10-CM | POA: Diagnosis not present

## 2022-09-23 DIAGNOSIS — I4519 Other right bundle-branch block: Secondary | ICD-10-CM | POA: Diagnosis not present

## 2022-09-23 DIAGNOSIS — S062X0A Diffuse traumatic brain injury without loss of consciousness, initial encounter: Secondary | ICD-10-CM | POA: Diagnosis not present

## 2022-09-23 DIAGNOSIS — R571 Hypovolemic shock: Secondary | ICD-10-CM | POA: Diagnosis not present

## 2022-09-23 DIAGNOSIS — Z9911 Dependence on respirator [ventilator] status: Secondary | ICD-10-CM | POA: Diagnosis not present

## 2022-09-23 DIAGNOSIS — R509 Fever, unspecified: Secondary | ICD-10-CM | POA: Diagnosis not present

## 2022-09-23 DIAGNOSIS — J9600 Acute respiratory failure, unspecified whether with hypoxia or hypercapnia: Secondary | ICD-10-CM | POA: Diagnosis not present

## 2022-09-23 DIAGNOSIS — S065X0A Traumatic subdural hemorrhage without loss of consciousness, initial encounter: Secondary | ICD-10-CM | POA: Diagnosis not present

## 2022-09-23 DIAGNOSIS — G9340 Encephalopathy, unspecified: Secondary | ICD-10-CM | POA: Diagnosis not present

## 2022-09-23 DIAGNOSIS — J9811 Atelectasis: Secondary | ICD-10-CM | POA: Diagnosis not present

## 2022-09-23 DIAGNOSIS — R895 Abnormal microbiological findings in specimens from other organs, systems and tissues: Secondary | ICD-10-CM | POA: Diagnosis not present

## 2022-09-23 DIAGNOSIS — R4182 Altered mental status, unspecified: Secondary | ICD-10-CM | POA: Diagnosis not present

## 2022-09-23 DIAGNOSIS — B957 Other staphylococcus as the cause of diseases classified elsewhere: Secondary | ICD-10-CM | POA: Diagnosis not present

## 2022-09-24 DIAGNOSIS — I4891 Unspecified atrial fibrillation: Secondary | ICD-10-CM | POA: Diagnosis not present

## 2022-09-24 DIAGNOSIS — J9811 Atelectasis: Secondary | ICD-10-CM | POA: Diagnosis not present

## 2022-09-24 DIAGNOSIS — R571 Hypovolemic shock: Secondary | ICD-10-CM | POA: Diagnosis not present

## 2022-09-24 DIAGNOSIS — J90 Pleural effusion, not elsewhere classified: Secondary | ICD-10-CM | POA: Diagnosis not present

## 2022-09-24 DIAGNOSIS — S12401A Unspecified nondisplaced fracture of fifth cervical vertebra, initial encounter for closed fracture: Secondary | ICD-10-CM | POA: Diagnosis not present

## 2022-09-24 DIAGNOSIS — J811 Chronic pulmonary edema: Secondary | ICD-10-CM | POA: Diagnosis not present

## 2022-09-24 DIAGNOSIS — S062XAA Diffuse traumatic brain injury with loss of consciousness status unknown, initial encounter: Secondary | ICD-10-CM | POA: Diagnosis not present

## 2022-09-24 DIAGNOSIS — R9401 Abnormal electroencephalogram [EEG]: Secondary | ICD-10-CM | POA: Diagnosis not present

## 2022-09-24 DIAGNOSIS — Z9911 Dependence on respirator [ventilator] status: Secondary | ICD-10-CM | POA: Diagnosis not present

## 2022-09-24 DIAGNOSIS — J9600 Acute respiratory failure, unspecified whether with hypoxia or hypercapnia: Secondary | ICD-10-CM | POA: Diagnosis not present

## 2022-09-24 DIAGNOSIS — R4182 Altered mental status, unspecified: Secondary | ICD-10-CM | POA: Diagnosis not present

## 2022-09-24 DIAGNOSIS — G9340 Encephalopathy, unspecified: Secondary | ICD-10-CM | POA: Diagnosis not present

## 2022-09-25 DIAGNOSIS — Z9911 Dependence on respirator [ventilator] status: Secondary | ICD-10-CM | POA: Diagnosis not present

## 2022-09-25 DIAGNOSIS — R9401 Abnormal electroencephalogram [EEG]: Secondary | ICD-10-CM | POA: Diagnosis not present

## 2022-09-25 DIAGNOSIS — R4182 Altered mental status, unspecified: Secondary | ICD-10-CM | POA: Diagnosis not present

## 2022-09-25 DIAGNOSIS — J9600 Acute respiratory failure, unspecified whether with hypoxia or hypercapnia: Secondary | ICD-10-CM | POA: Diagnosis not present

## 2022-09-25 DIAGNOSIS — R571 Hypovolemic shock: Secondary | ICD-10-CM | POA: Diagnosis not present

## 2022-09-25 DIAGNOSIS — J9811 Atelectasis: Secondary | ICD-10-CM | POA: Diagnosis not present

## 2022-09-25 DIAGNOSIS — G9340 Encephalopathy, unspecified: Secondary | ICD-10-CM | POA: Diagnosis not present

## 2022-09-25 DIAGNOSIS — I4891 Unspecified atrial fibrillation: Secondary | ICD-10-CM | POA: Diagnosis not present

## 2022-09-26 DIAGNOSIS — Z9911 Dependence on respirator [ventilator] status: Secondary | ICD-10-CM | POA: Diagnosis not present

## 2022-09-26 DIAGNOSIS — S12401A Unspecified nondisplaced fracture of fifth cervical vertebra, initial encounter for closed fracture: Secondary | ICD-10-CM | POA: Diagnosis not present

## 2022-09-26 DIAGNOSIS — J9600 Acute respiratory failure, unspecified whether with hypoxia or hypercapnia: Secondary | ICD-10-CM | POA: Diagnosis not present

## 2022-09-26 DIAGNOSIS — R571 Hypovolemic shock: Secondary | ICD-10-CM | POA: Diagnosis not present

## 2022-09-26 DIAGNOSIS — I4891 Unspecified atrial fibrillation: Secondary | ICD-10-CM | POA: Diagnosis not present

## 2022-09-26 DIAGNOSIS — R4182 Altered mental status, unspecified: Secondary | ICD-10-CM | POA: Diagnosis not present

## 2022-09-26 DIAGNOSIS — J90 Pleural effusion, not elsewhere classified: Secondary | ICD-10-CM | POA: Diagnosis not present

## 2022-09-26 DIAGNOSIS — J811 Chronic pulmonary edema: Secondary | ICD-10-CM | POA: Diagnosis not present

## 2022-09-26 DIAGNOSIS — S062XAA Diffuse traumatic brain injury with loss of consciousness status unknown, initial encounter: Secondary | ICD-10-CM | POA: Diagnosis not present

## 2022-09-26 DIAGNOSIS — G9340 Encephalopathy, unspecified: Secondary | ICD-10-CM | POA: Diagnosis not present

## 2022-09-26 DIAGNOSIS — K5900 Constipation, unspecified: Secondary | ICD-10-CM | POA: Diagnosis not present

## 2022-09-27 DIAGNOSIS — J9601 Acute respiratory failure with hypoxia: Secondary | ICD-10-CM | POA: Diagnosis not present

## 2022-09-27 DIAGNOSIS — I2510 Atherosclerotic heart disease of native coronary artery without angina pectoris: Secondary | ICD-10-CM | POA: Diagnosis not present

## 2022-09-27 DIAGNOSIS — S062XAA Diffuse traumatic brain injury with loss of consciousness status unknown, initial encounter: Secondary | ICD-10-CM | POA: Diagnosis not present

## 2022-09-27 DIAGNOSIS — R9431 Abnormal electrocardiogram [ECG] [EKG]: Secondary | ICD-10-CM | POA: Diagnosis not present

## 2022-09-27 DIAGNOSIS — G8911 Acute pain due to trauma: Secondary | ICD-10-CM | POA: Diagnosis not present

## 2022-09-27 DIAGNOSIS — R0689 Other abnormalities of breathing: Secondary | ICD-10-CM | POA: Diagnosis not present

## 2022-09-27 DIAGNOSIS — R4182 Altered mental status, unspecified: Secondary | ICD-10-CM | POA: Diagnosis not present

## 2022-09-27 DIAGNOSIS — Z9911 Dependence on respirator [ventilator] status: Secondary | ICD-10-CM | POA: Diagnosis not present

## 2022-09-27 DIAGNOSIS — R69 Illness, unspecified: Secondary | ICD-10-CM | POA: Diagnosis not present

## 2022-09-27 DIAGNOSIS — D62 Acute posthemorrhagic anemia: Secondary | ICD-10-CM | POA: Diagnosis not present

## 2022-09-27 DIAGNOSIS — J9811 Atelectasis: Secondary | ICD-10-CM | POA: Diagnosis not present

## 2022-09-27 DIAGNOSIS — I4891 Unspecified atrial fibrillation: Secondary | ICD-10-CM | POA: Diagnosis not present

## 2022-09-27 DIAGNOSIS — S12401A Unspecified nondisplaced fracture of fifth cervical vertebra, initial encounter for closed fracture: Secondary | ICD-10-CM | POA: Diagnosis not present

## 2022-09-28 DIAGNOSIS — R4182 Altered mental status, unspecified: Secondary | ICD-10-CM | POA: Diagnosis not present

## 2022-09-28 DIAGNOSIS — J9601 Acute respiratory failure with hypoxia: Secondary | ICD-10-CM | POA: Diagnosis not present

## 2022-09-28 DIAGNOSIS — S062XAA Diffuse traumatic brain injury with loss of consciousness status unknown, initial encounter: Secondary | ICD-10-CM | POA: Diagnosis not present

## 2022-09-28 DIAGNOSIS — I2510 Atherosclerotic heart disease of native coronary artery without angina pectoris: Secondary | ICD-10-CM | POA: Diagnosis not present

## 2022-09-28 DIAGNOSIS — R0600 Dyspnea, unspecified: Secondary | ICD-10-CM | POA: Diagnosis not present

## 2022-09-28 DIAGNOSIS — S065XAA Traumatic subdural hemorrhage with loss of consciousness status unknown, initial encounter: Secondary | ICD-10-CM | POA: Diagnosis not present

## 2022-09-28 DIAGNOSIS — D62 Acute posthemorrhagic anemia: Secondary | ICD-10-CM | POA: Diagnosis not present

## 2022-09-28 DIAGNOSIS — R451 Restlessness and agitation: Secondary | ICD-10-CM | POA: Diagnosis not present

## 2022-09-28 DIAGNOSIS — R410 Disorientation, unspecified: Secondary | ICD-10-CM | POA: Diagnosis not present

## 2022-09-28 DIAGNOSIS — R000 Tachycardia, unspecified: Secondary | ICD-10-CM | POA: Diagnosis not present

## 2022-09-28 DIAGNOSIS — S069X0A Unspecified intracranial injury without loss of consciousness, initial encounter: Secondary | ICD-10-CM | POA: Diagnosis not present

## 2022-09-29 DIAGNOSIS — R410 Disorientation, unspecified: Secondary | ICD-10-CM | POA: Diagnosis not present

## 2022-09-29 DIAGNOSIS — R451 Restlessness and agitation: Secondary | ICD-10-CM | POA: Diagnosis not present

## 2022-09-29 DIAGNOSIS — Z9981 Dependence on supplemental oxygen: Secondary | ICD-10-CM | POA: Diagnosis not present

## 2022-09-29 DIAGNOSIS — R9431 Abnormal electrocardiogram [ECG] [EKG]: Secondary | ICD-10-CM | POA: Diagnosis not present

## 2022-09-29 DIAGNOSIS — S0240CA Maxillary fracture, right side, initial encounter for closed fracture: Secondary | ICD-10-CM | POA: Diagnosis not present

## 2022-09-29 DIAGNOSIS — S062XAA Diffuse traumatic brain injury with loss of consciousness status unknown, initial encounter: Secondary | ICD-10-CM | POA: Diagnosis not present

## 2022-09-29 DIAGNOSIS — R4182 Altered mental status, unspecified: Secondary | ICD-10-CM | POA: Diagnosis not present

## 2022-09-29 DIAGNOSIS — R0902 Hypoxemia: Secondary | ICD-10-CM | POA: Diagnosis not present

## 2022-09-29 DIAGNOSIS — K5900 Constipation, unspecified: Secondary | ICD-10-CM | POA: Diagnosis not present

## 2022-09-29 DIAGNOSIS — I2510 Atherosclerotic heart disease of native coronary artery without angina pectoris: Secondary | ICD-10-CM | POA: Diagnosis not present

## 2022-09-29 DIAGNOSIS — R0689 Other abnormalities of breathing: Secondary | ICD-10-CM | POA: Diagnosis not present

## 2022-09-29 DIAGNOSIS — D62 Acute posthemorrhagic anemia: Secondary | ICD-10-CM | POA: Diagnosis not present

## 2022-09-29 DIAGNOSIS — S065XAA Traumatic subdural hemorrhage with loss of consciousness status unknown, initial encounter: Secondary | ICD-10-CM | POA: Diagnosis not present

## 2022-09-30 DIAGNOSIS — S0240CA Maxillary fracture, right side, initial encounter for closed fracture: Secondary | ICD-10-CM | POA: Diagnosis not present

## 2022-09-30 DIAGNOSIS — R9431 Abnormal electrocardiogram [ECG] [EKG]: Secondary | ICD-10-CM | POA: Diagnosis not present

## 2022-09-30 DIAGNOSIS — S12390D Other displaced fracture of fourth cervical vertebra, subsequent encounter for fracture with routine healing: Secondary | ICD-10-CM | POA: Diagnosis not present

## 2022-09-30 DIAGNOSIS — G931 Anoxic brain damage, not elsewhere classified: Secondary | ICD-10-CM | POA: Diagnosis not present

## 2022-09-30 DIAGNOSIS — R69 Illness, unspecified: Secondary | ICD-10-CM | POA: Diagnosis not present

## 2022-09-30 DIAGNOSIS — Z452 Encounter for adjustment and management of vascular access device: Secondary | ICD-10-CM | POA: Diagnosis not present

## 2022-09-30 DIAGNOSIS — S065X9D Traumatic subdural hemorrhage with loss of consciousness of unspecified duration, subsequent encounter: Secondary | ICD-10-CM | POA: Diagnosis not present

## 2022-09-30 DIAGNOSIS — S062XAA Diffuse traumatic brain injury with loss of consciousness status unknown, initial encounter: Secondary | ICD-10-CM | POA: Diagnosis not present

## 2022-09-30 DIAGNOSIS — S065XAA Traumatic subdural hemorrhage with loss of consciousness status unknown, initial encounter: Secondary | ICD-10-CM | POA: Diagnosis not present

## 2022-09-30 DIAGNOSIS — R410 Disorientation, unspecified: Secondary | ICD-10-CM | POA: Diagnosis not present

## 2022-09-30 DIAGNOSIS — S0240CD Maxillary fracture, right side, subsequent encounter for fracture with routine healing: Secondary | ICD-10-CM | POA: Diagnosis not present

## 2022-09-30 DIAGNOSIS — Z9981 Dependence on supplemental oxygen: Secondary | ICD-10-CM | POA: Diagnosis not present

## 2022-09-30 DIAGNOSIS — R0902 Hypoxemia: Secondary | ICD-10-CM | POA: Diagnosis not present

## 2022-09-30 DIAGNOSIS — Z4682 Encounter for fitting and adjustment of non-vascular catheter: Secondary | ICD-10-CM | POA: Diagnosis not present

## 2022-09-30 DIAGNOSIS — S12490D Other displaced fracture of fifth cervical vertebra, subsequent encounter for fracture with routine healing: Secondary | ICD-10-CM | POA: Diagnosis not present

## 2022-09-30 DIAGNOSIS — J9811 Atelectasis: Secondary | ICD-10-CM | POA: Diagnosis not present

## 2022-10-01 DIAGNOSIS — S0003XA Contusion of scalp, initial encounter: Secondary | ICD-10-CM | POA: Diagnosis not present

## 2022-10-01 DIAGNOSIS — G9340 Encephalopathy, unspecified: Secondary | ICD-10-CM | POA: Diagnosis not present

## 2022-10-01 DIAGNOSIS — Z041 Encounter for examination and observation following transport accident: Secondary | ICD-10-CM | POA: Diagnosis not present

## 2022-10-01 DIAGNOSIS — R29890 Loss of height: Secondary | ICD-10-CM | POA: Diagnosis not present

## 2022-10-01 DIAGNOSIS — R6521 Severe sepsis with septic shock: Secondary | ICD-10-CM | POA: Diagnosis not present

## 2022-10-01 DIAGNOSIS — D62 Acute posthemorrhagic anemia: Secondary | ICD-10-CM | POA: Diagnosis not present

## 2022-10-01 DIAGNOSIS — R69 Illness, unspecified: Secondary | ICD-10-CM | POA: Diagnosis not present

## 2022-10-01 DIAGNOSIS — R0689 Other abnormalities of breathing: Secondary | ICD-10-CM | POA: Diagnosis not present

## 2022-10-01 DIAGNOSIS — Z9981 Dependence on supplemental oxygen: Secondary | ICD-10-CM | POA: Diagnosis not present

## 2022-10-01 DIAGNOSIS — S0240CA Maxillary fracture, right side, initial encounter for closed fracture: Secondary | ICD-10-CM | POA: Diagnosis not present

## 2022-10-01 DIAGNOSIS — R410 Disorientation, unspecified: Secondary | ICD-10-CM | POA: Diagnosis not present

## 2022-10-01 DIAGNOSIS — R0902 Hypoxemia: Secondary | ICD-10-CM | POA: Diagnosis not present

## 2022-10-01 DIAGNOSIS — M4802 Spinal stenosis, cervical region: Secondary | ICD-10-CM | POA: Diagnosis not present

## 2022-10-01 DIAGNOSIS — S062X0A Diffuse traumatic brain injury without loss of consciousness, initial encounter: Secondary | ICD-10-CM | POA: Diagnosis not present

## 2022-10-01 DIAGNOSIS — Z4682 Encounter for fitting and adjustment of non-vascular catheter: Secondary | ICD-10-CM | POA: Diagnosis not present

## 2022-10-01 DIAGNOSIS — S12400A Unspecified displaced fracture of fifth cervical vertebra, initial encounter for closed fracture: Secondary | ICD-10-CM | POA: Diagnosis not present

## 2022-10-01 DIAGNOSIS — R4182 Altered mental status, unspecified: Secondary | ICD-10-CM | POA: Diagnosis not present

## 2022-10-01 DIAGNOSIS — S065X0A Traumatic subdural hemorrhage without loss of consciousness, initial encounter: Secondary | ICD-10-CM | POA: Diagnosis not present

## 2022-10-01 DIAGNOSIS — M47812 Spondylosis without myelopathy or radiculopathy, cervical region: Secondary | ICD-10-CM | POA: Diagnosis not present

## 2022-10-01 DIAGNOSIS — G9389 Other specified disorders of brain: Secondary | ICD-10-CM | POA: Diagnosis not present

## 2022-10-01 DIAGNOSIS — S22059A Unspecified fracture of T5-T6 vertebra, initial encounter for closed fracture: Secondary | ICD-10-CM | POA: Diagnosis not present

## 2022-10-01 DIAGNOSIS — R451 Restlessness and agitation: Secondary | ICD-10-CM | POA: Diagnosis not present

## 2022-10-01 DIAGNOSIS — A419 Sepsis, unspecified organism: Secondary | ICD-10-CM | POA: Diagnosis not present

## 2022-10-01 DIAGNOSIS — Z515 Encounter for palliative care: Secondary | ICD-10-CM | POA: Diagnosis not present

## 2022-10-01 DIAGNOSIS — I2510 Atherosclerotic heart disease of native coronary artery without angina pectoris: Secondary | ICD-10-CM | POA: Diagnosis not present

## 2022-10-02 DIAGNOSIS — G9340 Encephalopathy, unspecified: Secondary | ICD-10-CM | POA: Diagnosis not present

## 2022-10-02 DIAGNOSIS — Z9911 Dependence on respirator [ventilator] status: Secondary | ICD-10-CM | POA: Diagnosis not present

## 2022-10-02 DIAGNOSIS — J90 Pleural effusion, not elsewhere classified: Secondary | ICD-10-CM | POA: Diagnosis not present

## 2022-10-02 DIAGNOSIS — J9811 Atelectasis: Secondary | ICD-10-CM | POA: Diagnosis not present

## 2022-10-02 DIAGNOSIS — S062XAA Diffuse traumatic brain injury with loss of consciousness status unknown, initial encounter: Secondary | ICD-10-CM | POA: Diagnosis not present

## 2022-10-02 DIAGNOSIS — R4182 Altered mental status, unspecified: Secondary | ICD-10-CM | POA: Diagnosis not present

## 2022-10-02 DIAGNOSIS — I2510 Atherosclerotic heart disease of native coronary artery without angina pectoris: Secondary | ICD-10-CM | POA: Diagnosis not present

## 2022-10-02 DIAGNOSIS — I7774 Dissection of vertebral artery: Secondary | ICD-10-CM | POA: Diagnosis not present

## 2022-10-02 DIAGNOSIS — D62 Acute posthemorrhagic anemia: Secondary | ICD-10-CM | POA: Diagnosis not present

## 2022-10-02 DIAGNOSIS — S0240CA Maxillary fracture, right side, initial encounter for closed fracture: Secondary | ICD-10-CM | POA: Diagnosis not present

## 2022-10-02 DIAGNOSIS — J9601 Acute respiratory failure with hypoxia: Secondary | ICD-10-CM | POA: Diagnosis not present

## 2022-10-02 DIAGNOSIS — S065XAA Traumatic subdural hemorrhage with loss of consciousness status unknown, initial encounter: Secondary | ICD-10-CM | POA: Diagnosis not present

## 2022-10-03 DIAGNOSIS — D62 Acute posthemorrhagic anemia: Secondary | ICD-10-CM | POA: Diagnosis not present

## 2022-10-03 DIAGNOSIS — S062XAA Diffuse traumatic brain injury with loss of consciousness status unknown, initial encounter: Secondary | ICD-10-CM | POA: Diagnosis not present

## 2022-10-03 DIAGNOSIS — J9601 Acute respiratory failure with hypoxia: Secondary | ICD-10-CM | POA: Diagnosis not present

## 2022-10-03 DIAGNOSIS — R9431 Abnormal electrocardiogram [ECG] [EKG]: Secondary | ICD-10-CM | POA: Diagnosis not present

## 2022-10-03 DIAGNOSIS — S065XAA Traumatic subdural hemorrhage with loss of consciousness status unknown, initial encounter: Secondary | ICD-10-CM | POA: Diagnosis not present

## 2022-10-03 DIAGNOSIS — Z9981 Dependence on supplemental oxygen: Secondary | ICD-10-CM | POA: Diagnosis not present

## 2022-10-03 DIAGNOSIS — R0902 Hypoxemia: Secondary | ICD-10-CM | POA: Diagnosis not present

## 2022-10-03 DIAGNOSIS — I2510 Atherosclerotic heart disease of native coronary artery without angina pectoris: Secondary | ICD-10-CM | POA: Diagnosis not present

## 2022-10-03 DIAGNOSIS — R4182 Altered mental status, unspecified: Secondary | ICD-10-CM | POA: Diagnosis not present

## 2022-10-03 DIAGNOSIS — S0240CA Maxillary fracture, right side, initial encounter for closed fracture: Secondary | ICD-10-CM | POA: Diagnosis not present

## 2022-10-04 DIAGNOSIS — S062XAA Diffuse traumatic brain injury with loss of consciousness status unknown, initial encounter: Secondary | ICD-10-CM | POA: Diagnosis not present

## 2022-10-04 DIAGNOSIS — E8770 Fluid overload, unspecified: Secondary | ICD-10-CM | POA: Diagnosis not present

## 2022-10-04 DIAGNOSIS — Z9911 Dependence on respirator [ventilator] status: Secondary | ICD-10-CM | POA: Diagnosis not present

## 2022-10-04 DIAGNOSIS — S065X0A Traumatic subdural hemorrhage without loss of consciousness, initial encounter: Secondary | ICD-10-CM | POA: Diagnosis not present

## 2022-10-04 DIAGNOSIS — S12390D Other displaced fracture of fourth cervical vertebra, subsequent encounter for fracture with routine healing: Secondary | ICD-10-CM | POA: Diagnosis not present

## 2022-10-04 DIAGNOSIS — S062X9D Diffuse traumatic brain injury with loss of consciousness of unspecified duration, subsequent encounter: Secondary | ICD-10-CM | POA: Diagnosis not present

## 2022-10-04 DIAGNOSIS — S0240CD Maxillary fracture, right side, subsequent encounter for fracture with routine healing: Secondary | ICD-10-CM | POA: Diagnosis not present

## 2022-10-04 DIAGNOSIS — J9602 Acute respiratory failure with hypercapnia: Secondary | ICD-10-CM | POA: Diagnosis not present

## 2022-10-04 DIAGNOSIS — G931 Anoxic brain damage, not elsewhere classified: Secondary | ICD-10-CM | POA: Diagnosis not present

## 2022-10-04 DIAGNOSIS — Z515 Encounter for palliative care: Secondary | ICD-10-CM | POA: Diagnosis not present

## 2022-10-04 DIAGNOSIS — R9431 Abnormal electrocardiogram [ECG] [EKG]: Secondary | ICD-10-CM | POA: Diagnosis not present

## 2022-10-04 DIAGNOSIS — S12490D Other displaced fracture of fifth cervical vertebra, subsequent encounter for fracture with routine healing: Secondary | ICD-10-CM | POA: Diagnosis not present

## 2022-10-04 DIAGNOSIS — R410 Disorientation, unspecified: Secondary | ICD-10-CM | POA: Diagnosis not present

## 2022-10-04 DIAGNOSIS — J9600 Acute respiratory failure, unspecified whether with hypoxia or hypercapnia: Secondary | ICD-10-CM | POA: Diagnosis not present

## 2022-10-04 DIAGNOSIS — R69 Illness, unspecified: Secondary | ICD-10-CM | POA: Diagnosis not present

## 2022-10-04 DIAGNOSIS — S065X9D Traumatic subdural hemorrhage with loss of consciousness of unspecified duration, subsequent encounter: Secondary | ICD-10-CM | POA: Diagnosis not present

## 2022-10-05 DIAGNOSIS — J9600 Acute respiratory failure, unspecified whether with hypoxia or hypercapnia: Secondary | ICD-10-CM | POA: Diagnosis not present

## 2022-10-05 DIAGNOSIS — R69 Illness, unspecified: Secondary | ICD-10-CM | POA: Diagnosis not present

## 2022-10-05 DIAGNOSIS — R410 Disorientation, unspecified: Secondary | ICD-10-CM | POA: Diagnosis not present

## 2022-10-05 DIAGNOSIS — Z9911 Dependence on respirator [ventilator] status: Secondary | ICD-10-CM | POA: Diagnosis not present

## 2022-10-05 DIAGNOSIS — E8770 Fluid overload, unspecified: Secondary | ICD-10-CM | POA: Diagnosis not present

## 2022-10-05 DIAGNOSIS — R9431 Abnormal electrocardiogram [ECG] [EKG]: Secondary | ICD-10-CM | POA: Diagnosis not present

## 2022-10-05 DIAGNOSIS — J9811 Atelectasis: Secondary | ICD-10-CM | POA: Diagnosis not present

## 2022-10-05 DIAGNOSIS — J90 Pleural effusion, not elsewhere classified: Secondary | ICD-10-CM | POA: Diagnosis not present

## 2022-10-06 DIAGNOSIS — Z9911 Dependence on respirator [ventilator] status: Secondary | ICD-10-CM | POA: Diagnosis not present

## 2022-10-06 DIAGNOSIS — J9600 Acute respiratory failure, unspecified whether with hypoxia or hypercapnia: Secondary | ICD-10-CM | POA: Diagnosis not present

## 2022-10-06 DIAGNOSIS — R69 Illness, unspecified: Secondary | ICD-10-CM | POA: Diagnosis not present

## 2022-10-06 DIAGNOSIS — S069X0A Unspecified intracranial injury without loss of consciousness, initial encounter: Secondary | ICD-10-CM | POA: Diagnosis not present

## 2022-10-06 DIAGNOSIS — E8770 Fluid overload, unspecified: Secondary | ICD-10-CM | POA: Diagnosis not present

## 2022-10-06 DIAGNOSIS — R410 Disorientation, unspecified: Secondary | ICD-10-CM | POA: Diagnosis not present

## 2022-10-07 DIAGNOSIS — Z9911 Dependence on respirator [ventilator] status: Secondary | ICD-10-CM | POA: Diagnosis not present

## 2022-10-07 DIAGNOSIS — S065X1A Traumatic subdural hemorrhage with loss of consciousness of 30 minutes or less, initial encounter: Secondary | ICD-10-CM | POA: Diagnosis not present

## 2022-10-07 DIAGNOSIS — J9602 Acute respiratory failure with hypercapnia: Secondary | ICD-10-CM | POA: Diagnosis not present

## 2022-10-07 DIAGNOSIS — R410 Disorientation, unspecified: Secondary | ICD-10-CM | POA: Diagnosis not present

## 2022-10-07 DIAGNOSIS — E8770 Fluid overload, unspecified: Secondary | ICD-10-CM | POA: Diagnosis not present

## 2022-10-07 DIAGNOSIS — J9600 Acute respiratory failure, unspecified whether with hypoxia or hypercapnia: Secondary | ICD-10-CM | POA: Diagnosis not present

## 2022-10-07 DIAGNOSIS — R69 Illness, unspecified: Secondary | ICD-10-CM | POA: Diagnosis not present

## 2022-10-07 DIAGNOSIS — R9431 Abnormal electrocardiogram [ECG] [EKG]: Secondary | ICD-10-CM | POA: Diagnosis not present

## 2022-10-07 DIAGNOSIS — Z515 Encounter for palliative care: Secondary | ICD-10-CM | POA: Diagnosis not present

## 2022-10-07 DIAGNOSIS — S062XAA Diffuse traumatic brain injury with loss of consciousness status unknown, initial encounter: Secondary | ICD-10-CM | POA: Diagnosis not present

## 2022-10-08 DIAGNOSIS — R69 Illness, unspecified: Secondary | ICD-10-CM | POA: Diagnosis not present

## 2022-10-08 DIAGNOSIS — Z430 Encounter for attention to tracheostomy: Secondary | ICD-10-CM | POA: Diagnosis not present

## 2022-10-08 DIAGNOSIS — I4891 Unspecified atrial fibrillation: Secondary | ICD-10-CM | POA: Diagnosis not present

## 2022-10-08 DIAGNOSIS — R410 Disorientation, unspecified: Secondary | ICD-10-CM | POA: Diagnosis not present

## 2022-10-08 DIAGNOSIS — E8770 Fluid overload, unspecified: Secondary | ICD-10-CM | POA: Diagnosis not present

## 2022-10-08 DIAGNOSIS — J9690 Respiratory failure, unspecified, unspecified whether with hypoxia or hypercapnia: Secondary | ICD-10-CM | POA: Diagnosis not present

## 2022-10-08 DIAGNOSIS — S069XAA Unspecified intracranial injury with loss of consciousness status unknown, initial encounter: Secondary | ICD-10-CM | POA: Diagnosis not present

## 2022-10-08 DIAGNOSIS — I2510 Atherosclerotic heart disease of native coronary artery without angina pectoris: Secondary | ICD-10-CM | POA: Diagnosis not present

## 2022-10-08 DIAGNOSIS — R451 Restlessness and agitation: Secondary | ICD-10-CM | POA: Diagnosis not present

## 2022-10-08 DIAGNOSIS — Z4682 Encounter for fitting and adjustment of non-vascular catheter: Secondary | ICD-10-CM | POA: Diagnosis not present

## 2022-10-08 DIAGNOSIS — J9600 Acute respiratory failure, unspecified whether with hypoxia or hypercapnia: Secondary | ICD-10-CM | POA: Diagnosis not present

## 2022-10-08 DIAGNOSIS — J9811 Atelectasis: Secondary | ICD-10-CM | POA: Diagnosis not present

## 2022-10-08 DIAGNOSIS — Z9911 Dependence on respirator [ventilator] status: Secondary | ICD-10-CM | POA: Diagnosis not present

## 2022-10-09 DIAGNOSIS — R9431 Abnormal electrocardiogram [ECG] [EKG]: Secondary | ICD-10-CM | POA: Diagnosis not present

## 2022-10-09 DIAGNOSIS — E8770 Fluid overload, unspecified: Secondary | ICD-10-CM | POA: Diagnosis not present

## 2022-10-09 DIAGNOSIS — Z9911 Dependence on respirator [ventilator] status: Secondary | ICD-10-CM | POA: Diagnosis not present

## 2022-10-09 DIAGNOSIS — J9600 Acute respiratory failure, unspecified whether with hypoxia or hypercapnia: Secondary | ICD-10-CM | POA: Diagnosis not present

## 2022-10-09 DIAGNOSIS — R410 Disorientation, unspecified: Secondary | ICD-10-CM | POA: Diagnosis not present

## 2022-10-09 DIAGNOSIS — R69 Illness, unspecified: Secondary | ICD-10-CM | POA: Diagnosis not present

## 2022-10-09 DIAGNOSIS — S069X0A Unspecified intracranial injury without loss of consciousness, initial encounter: Secondary | ICD-10-CM | POA: Diagnosis not present

## 2022-10-10 DIAGNOSIS — R69 Illness, unspecified: Secondary | ICD-10-CM | POA: Diagnosis not present

## 2022-10-10 DIAGNOSIS — Z9911 Dependence on respirator [ventilator] status: Secondary | ICD-10-CM | POA: Diagnosis not present

## 2022-10-10 DIAGNOSIS — I4891 Unspecified atrial fibrillation: Secondary | ICD-10-CM | POA: Diagnosis not present

## 2022-10-10 DIAGNOSIS — K921 Melena: Secondary | ICD-10-CM | POA: Diagnosis not present

## 2022-10-10 DIAGNOSIS — J9600 Acute respiratory failure, unspecified whether with hypoxia or hypercapnia: Secondary | ICD-10-CM | POA: Diagnosis not present

## 2022-10-10 DIAGNOSIS — R9431 Abnormal electrocardiogram [ECG] [EKG]: Secondary | ICD-10-CM | POA: Diagnosis not present

## 2022-10-11 DIAGNOSIS — R451 Restlessness and agitation: Secondary | ICD-10-CM | POA: Diagnosis not present

## 2022-10-11 DIAGNOSIS — R69 Illness, unspecified: Secondary | ICD-10-CM | POA: Diagnosis not present

## 2022-10-11 DIAGNOSIS — J9600 Acute respiratory failure, unspecified whether with hypoxia or hypercapnia: Secondary | ICD-10-CM | POA: Diagnosis not present

## 2022-10-11 DIAGNOSIS — R410 Disorientation, unspecified: Secondary | ICD-10-CM | POA: Diagnosis not present

## 2022-10-11 DIAGNOSIS — R9431 Abnormal electrocardiogram [ECG] [EKG]: Secondary | ICD-10-CM | POA: Diagnosis not present

## 2022-10-11 DIAGNOSIS — Z9911 Dependence on respirator [ventilator] status: Secondary | ICD-10-CM | POA: Diagnosis not present

## 2022-10-11 DIAGNOSIS — I4891 Unspecified atrial fibrillation: Secondary | ICD-10-CM | POA: Diagnosis not present

## 2022-10-11 DIAGNOSIS — S069X0A Unspecified intracranial injury without loss of consciousness, initial encounter: Secondary | ICD-10-CM | POA: Diagnosis not present

## 2022-10-11 DIAGNOSIS — Z930 Tracheostomy status: Secondary | ICD-10-CM | POA: Diagnosis not present

## 2022-10-12 DIAGNOSIS — R509 Fever, unspecified: Secondary | ICD-10-CM | POA: Diagnosis not present

## 2022-10-12 DIAGNOSIS — R69 Illness, unspecified: Secondary | ICD-10-CM | POA: Diagnosis not present

## 2022-10-12 DIAGNOSIS — R9431 Abnormal electrocardiogram [ECG] [EKG]: Secondary | ICD-10-CM | POA: Diagnosis not present

## 2022-10-12 DIAGNOSIS — S065XAA Traumatic subdural hemorrhage with loss of consciousness status unknown, initial encounter: Secondary | ICD-10-CM | POA: Diagnosis not present

## 2022-10-12 DIAGNOSIS — J9600 Acute respiratory failure, unspecified whether with hypoxia or hypercapnia: Secondary | ICD-10-CM | POA: Diagnosis not present

## 2022-10-12 DIAGNOSIS — R410 Disorientation, unspecified: Secondary | ICD-10-CM | POA: Diagnosis not present

## 2022-10-12 DIAGNOSIS — S062XAA Diffuse traumatic brain injury with loss of consciousness status unknown, initial encounter: Secondary | ICD-10-CM | POA: Diagnosis not present

## 2022-10-12 DIAGNOSIS — R451 Restlessness and agitation: Secondary | ICD-10-CM | POA: Diagnosis not present

## 2022-10-13 DIAGNOSIS — R69 Illness, unspecified: Secondary | ICD-10-CM | POA: Diagnosis not present

## 2022-10-13 DIAGNOSIS — R633 Feeding difficulties, unspecified: Secondary | ICD-10-CM | POA: Diagnosis not present

## 2022-10-13 DIAGNOSIS — K2990 Gastroduodenitis, unspecified, without bleeding: Secondary | ICD-10-CM | POA: Diagnosis not present

## 2022-10-13 DIAGNOSIS — S065XAA Traumatic subdural hemorrhage with loss of consciousness status unknown, initial encounter: Secondary | ICD-10-CM | POA: Diagnosis not present

## 2022-10-13 DIAGNOSIS — S0003XA Contusion of scalp, initial encounter: Secondary | ICD-10-CM | POA: Diagnosis not present

## 2022-10-13 DIAGNOSIS — S062XAA Diffuse traumatic brain injury with loss of consciousness status unknown, initial encounter: Secondary | ICD-10-CM | POA: Diagnosis not present

## 2022-10-13 DIAGNOSIS — S12401A Unspecified nondisplaced fracture of fifth cervical vertebra, initial encounter for closed fracture: Secondary | ICD-10-CM | POA: Diagnosis not present

## 2022-10-13 DIAGNOSIS — J9600 Acute respiratory failure, unspecified whether with hypoxia or hypercapnia: Secondary | ICD-10-CM | POA: Diagnosis not present

## 2022-10-13 DIAGNOSIS — K2970 Gastritis, unspecified, without bleeding: Secondary | ICD-10-CM | POA: Diagnosis not present

## 2022-10-13 DIAGNOSIS — R6339 Other feeding difficulties: Secondary | ICD-10-CM | POA: Diagnosis not present

## 2022-10-13 DIAGNOSIS — K2980 Duodenitis without bleeding: Secondary | ICD-10-CM | POA: Diagnosis not present

## 2022-10-13 DIAGNOSIS — Z931 Gastrostomy status: Secondary | ICD-10-CM | POA: Diagnosis not present

## 2022-10-13 DIAGNOSIS — I4891 Unspecified atrial fibrillation: Secondary | ICD-10-CM | POA: Diagnosis not present

## 2022-10-13 DIAGNOSIS — R9431 Abnormal electrocardiogram [ECG] [EKG]: Secondary | ICD-10-CM | POA: Diagnosis not present

## 2022-10-14 DIAGNOSIS — S065XAA Traumatic subdural hemorrhage with loss of consciousness status unknown, initial encounter: Secondary | ICD-10-CM | POA: Diagnosis not present

## 2022-10-14 DIAGNOSIS — I4891 Unspecified atrial fibrillation: Secondary | ICD-10-CM | POA: Diagnosis not present

## 2022-10-14 DIAGNOSIS — R410 Disorientation, unspecified: Secondary | ICD-10-CM | POA: Diagnosis not present

## 2022-10-14 DIAGNOSIS — S062XAA Diffuse traumatic brain injury with loss of consciousness status unknown, initial encounter: Secondary | ICD-10-CM | POA: Diagnosis not present

## 2022-10-14 DIAGNOSIS — J9600 Acute respiratory failure, unspecified whether with hypoxia or hypercapnia: Secondary | ICD-10-CM | POA: Diagnosis not present

## 2022-10-15 DIAGNOSIS — I4891 Unspecified atrial fibrillation: Secondary | ICD-10-CM | POA: Diagnosis not present

## 2022-10-15 DIAGNOSIS — Z79899 Other long term (current) drug therapy: Secondary | ICD-10-CM | POA: Diagnosis not present

## 2022-10-15 DIAGNOSIS — J9600 Acute respiratory failure, unspecified whether with hypoxia or hypercapnia: Secondary | ICD-10-CM | POA: Diagnosis not present

## 2022-10-15 DIAGNOSIS — R69 Illness, unspecified: Secondary | ICD-10-CM | POA: Diagnosis not present

## 2022-10-15 DIAGNOSIS — R451 Restlessness and agitation: Secondary | ICD-10-CM | POA: Diagnosis not present

## 2022-10-15 DIAGNOSIS — S062XAA Diffuse traumatic brain injury with loss of consciousness status unknown, initial encounter: Secondary | ICD-10-CM | POA: Diagnosis not present

## 2022-10-15 DIAGNOSIS — S065XAA Traumatic subdural hemorrhage with loss of consciousness status unknown, initial encounter: Secondary | ICD-10-CM | POA: Diagnosis not present

## 2022-10-15 DIAGNOSIS — R410 Disorientation, unspecified: Secondary | ICD-10-CM | POA: Diagnosis not present

## 2022-10-16 DIAGNOSIS — R69 Illness, unspecified: Secondary | ICD-10-CM | POA: Diagnosis not present

## 2022-10-16 DIAGNOSIS — J9600 Acute respiratory failure, unspecified whether with hypoxia or hypercapnia: Secondary | ICD-10-CM | POA: Diagnosis not present

## 2022-10-16 DIAGNOSIS — S062XAA Diffuse traumatic brain injury with loss of consciousness status unknown, initial encounter: Secondary | ICD-10-CM | POA: Diagnosis not present

## 2022-10-16 DIAGNOSIS — S065XAA Traumatic subdural hemorrhage with loss of consciousness status unknown, initial encounter: Secondary | ICD-10-CM | POA: Diagnosis not present

## 2022-10-17 DIAGNOSIS — I4891 Unspecified atrial fibrillation: Secondary | ICD-10-CM | POA: Diagnosis not present

## 2022-10-17 DIAGNOSIS — J9600 Acute respiratory failure, unspecified whether with hypoxia or hypercapnia: Secondary | ICD-10-CM | POA: Diagnosis not present

## 2022-10-17 DIAGNOSIS — S22058A Other fracture of T5-T6 vertebra, initial encounter for closed fracture: Secondary | ICD-10-CM | POA: Diagnosis not present

## 2022-10-17 DIAGNOSIS — S062XAA Diffuse traumatic brain injury with loss of consciousness status unknown, initial encounter: Secondary | ICD-10-CM | POA: Diagnosis not present

## 2022-10-17 DIAGNOSIS — S065XAA Traumatic subdural hemorrhage with loss of consciousness status unknown, initial encounter: Secondary | ICD-10-CM | POA: Diagnosis not present

## 2022-10-18 DIAGNOSIS — Z79899 Other long term (current) drug therapy: Secondary | ICD-10-CM | POA: Diagnosis not present

## 2022-10-18 DIAGNOSIS — R9431 Abnormal electrocardiogram [ECG] [EKG]: Secondary | ICD-10-CM | POA: Diagnosis not present

## 2022-10-18 DIAGNOSIS — S065XAA Traumatic subdural hemorrhage with loss of consciousness status unknown, initial encounter: Secondary | ICD-10-CM | POA: Diagnosis not present

## 2022-10-18 DIAGNOSIS — S062XAA Diffuse traumatic brain injury with loss of consciousness status unknown, initial encounter: Secondary | ICD-10-CM | POA: Diagnosis not present

## 2022-10-18 DIAGNOSIS — J9600 Acute respiratory failure, unspecified whether with hypoxia or hypercapnia: Secondary | ICD-10-CM | POA: Diagnosis not present

## 2022-10-18 DIAGNOSIS — R451 Restlessness and agitation: Secondary | ICD-10-CM | POA: Diagnosis not present

## 2022-10-18 DIAGNOSIS — R410 Disorientation, unspecified: Secondary | ICD-10-CM | POA: Diagnosis not present

## 2022-10-18 DIAGNOSIS — R69 Illness, unspecified: Secondary | ICD-10-CM | POA: Diagnosis not present

## 2022-10-19 DIAGNOSIS — S065XAA Traumatic subdural hemorrhage with loss of consciousness status unknown, initial encounter: Secondary | ICD-10-CM | POA: Diagnosis not present

## 2022-10-19 DIAGNOSIS — S062XAA Diffuse traumatic brain injury with loss of consciousness status unknown, initial encounter: Secondary | ICD-10-CM | POA: Diagnosis not present

## 2022-10-19 DIAGNOSIS — J9600 Acute respiratory failure, unspecified whether with hypoxia or hypercapnia: Secondary | ICD-10-CM | POA: Diagnosis not present

## 2022-10-19 DIAGNOSIS — R69 Illness, unspecified: Secondary | ICD-10-CM | POA: Diagnosis not present

## 2022-10-20 DIAGNOSIS — I4891 Unspecified atrial fibrillation: Secondary | ICD-10-CM | POA: Diagnosis not present

## 2022-10-20 DIAGNOSIS — J9600 Acute respiratory failure, unspecified whether with hypoxia or hypercapnia: Secondary | ICD-10-CM | POA: Diagnosis not present

## 2022-10-20 DIAGNOSIS — S065XAA Traumatic subdural hemorrhage with loss of consciousness status unknown, initial encounter: Secondary | ICD-10-CM | POA: Diagnosis not present

## 2022-10-20 DIAGNOSIS — S062XAA Diffuse traumatic brain injury with loss of consciousness status unknown, initial encounter: Secondary | ICD-10-CM | POA: Diagnosis not present

## 2022-10-21 DIAGNOSIS — J9600 Acute respiratory failure, unspecified whether with hypoxia or hypercapnia: Secondary | ICD-10-CM | POA: Diagnosis not present

## 2022-10-21 DIAGNOSIS — R339 Retention of urine, unspecified: Secondary | ICD-10-CM | POA: Diagnosis not present

## 2022-10-21 DIAGNOSIS — R451 Restlessness and agitation: Secondary | ICD-10-CM | POA: Diagnosis not present

## 2022-10-21 DIAGNOSIS — R410 Disorientation, unspecified: Secondary | ICD-10-CM | POA: Diagnosis not present

## 2022-10-21 DIAGNOSIS — Z79899 Other long term (current) drug therapy: Secondary | ICD-10-CM | POA: Diagnosis not present

## 2022-10-21 DIAGNOSIS — S062XAA Diffuse traumatic brain injury with loss of consciousness status unknown, initial encounter: Secondary | ICD-10-CM | POA: Diagnosis not present

## 2022-10-21 DIAGNOSIS — S065XAA Traumatic subdural hemorrhage with loss of consciousness status unknown, initial encounter: Secondary | ICD-10-CM | POA: Diagnosis not present

## 2022-10-21 DIAGNOSIS — R509 Fever, unspecified: Secondary | ICD-10-CM | POA: Diagnosis not present

## 2022-10-22 DIAGNOSIS — R410 Disorientation, unspecified: Secondary | ICD-10-CM | POA: Diagnosis not present

## 2022-10-22 DIAGNOSIS — R9431 Abnormal electrocardiogram [ECG] [EKG]: Secondary | ICD-10-CM | POA: Diagnosis not present

## 2022-10-22 DIAGNOSIS — E870 Hyperosmolality and hypernatremia: Secondary | ICD-10-CM | POA: Diagnosis not present

## 2022-10-22 DIAGNOSIS — S065XAA Traumatic subdural hemorrhage with loss of consciousness status unknown, initial encounter: Secondary | ICD-10-CM | POA: Diagnosis not present

## 2022-10-22 DIAGNOSIS — J9600 Acute respiratory failure, unspecified whether with hypoxia or hypercapnia: Secondary | ICD-10-CM | POA: Diagnosis not present

## 2022-10-22 DIAGNOSIS — S062X0A Diffuse traumatic brain injury without loss of consciousness, initial encounter: Secondary | ICD-10-CM | POA: Diagnosis not present

## 2022-10-23 DIAGNOSIS — S065XAA Traumatic subdural hemorrhage with loss of consciousness status unknown, initial encounter: Secondary | ICD-10-CM | POA: Diagnosis not present

## 2022-10-23 DIAGNOSIS — S062XAA Diffuse traumatic brain injury with loss of consciousness status unknown, initial encounter: Secondary | ICD-10-CM | POA: Diagnosis not present

## 2022-10-23 DIAGNOSIS — S12390A Other displaced fracture of fourth cervical vertebra, initial encounter for closed fracture: Secondary | ICD-10-CM | POA: Diagnosis not present

## 2022-10-23 DIAGNOSIS — S12490A Other displaced fracture of fifth cervical vertebra, initial encounter for closed fracture: Secondary | ICD-10-CM | POA: Diagnosis not present

## 2022-10-24 DIAGNOSIS — R9431 Abnormal electrocardiogram [ECG] [EKG]: Secondary | ICD-10-CM | POA: Diagnosis not present

## 2022-10-24 DIAGNOSIS — S12490A Other displaced fracture of fifth cervical vertebra, initial encounter for closed fracture: Secondary | ICD-10-CM | POA: Diagnosis not present

## 2022-10-24 DIAGNOSIS — S065XAA Traumatic subdural hemorrhage with loss of consciousness status unknown, initial encounter: Secondary | ICD-10-CM | POA: Diagnosis not present

## 2022-10-24 DIAGNOSIS — S062XAA Diffuse traumatic brain injury with loss of consciousness status unknown, initial encounter: Secondary | ICD-10-CM | POA: Diagnosis not present

## 2022-10-24 DIAGNOSIS — S12390A Other displaced fracture of fourth cervical vertebra, initial encounter for closed fracture: Secondary | ICD-10-CM | POA: Diagnosis not present

## 2022-10-25 DIAGNOSIS — R410 Disorientation, unspecified: Secondary | ICD-10-CM | POA: Diagnosis not present

## 2022-10-25 DIAGNOSIS — R451 Restlessness and agitation: Secondary | ICD-10-CM | POA: Diagnosis not present

## 2022-10-25 DIAGNOSIS — S12390A Other displaced fracture of fourth cervical vertebra, initial encounter for closed fracture: Secondary | ICD-10-CM | POA: Diagnosis not present

## 2022-10-25 DIAGNOSIS — S12490A Other displaced fracture of fifth cervical vertebra, initial encounter for closed fracture: Secondary | ICD-10-CM | POA: Diagnosis not present

## 2022-10-25 DIAGNOSIS — Z79899 Other long term (current) drug therapy: Secondary | ICD-10-CM | POA: Diagnosis not present

## 2022-10-25 DIAGNOSIS — S062XAA Diffuse traumatic brain injury with loss of consciousness status unknown, initial encounter: Secondary | ICD-10-CM | POA: Diagnosis not present

## 2022-10-25 DIAGNOSIS — S065XAA Traumatic subdural hemorrhage with loss of consciousness status unknown, initial encounter: Secondary | ICD-10-CM | POA: Diagnosis not present

## 2022-10-26 DIAGNOSIS — S065XAA Traumatic subdural hemorrhage with loss of consciousness status unknown, initial encounter: Secondary | ICD-10-CM | POA: Diagnosis not present

## 2022-10-26 DIAGNOSIS — S062XAA Diffuse traumatic brain injury with loss of consciousness status unknown, initial encounter: Secondary | ICD-10-CM | POA: Diagnosis not present

## 2022-10-26 DIAGNOSIS — S0240CA Maxillary fracture, right side, initial encounter for closed fracture: Secondary | ICD-10-CM | POA: Diagnosis not present

## 2022-10-26 DIAGNOSIS — R69 Illness, unspecified: Secondary | ICD-10-CM | POA: Diagnosis not present

## 2022-10-27 DIAGNOSIS — R451 Restlessness and agitation: Secondary | ICD-10-CM | POA: Diagnosis not present

## 2022-10-27 DIAGNOSIS — R69 Illness, unspecified: Secondary | ICD-10-CM | POA: Diagnosis not present

## 2022-10-27 DIAGNOSIS — S0240CA Maxillary fracture, right side, initial encounter for closed fracture: Secondary | ICD-10-CM | POA: Diagnosis not present

## 2022-10-27 DIAGNOSIS — S22058A Other fracture of T5-T6 vertebra, initial encounter for closed fracture: Secondary | ICD-10-CM | POA: Diagnosis not present

## 2022-10-27 DIAGNOSIS — S065XAA Traumatic subdural hemorrhage with loss of consciousness status unknown, initial encounter: Secondary | ICD-10-CM | POA: Diagnosis not present

## 2022-10-27 DIAGNOSIS — F69 Unspecified disorder of adult personality and behavior: Secondary | ICD-10-CM | POA: Diagnosis not present

## 2022-10-27 DIAGNOSIS — S062XAA Diffuse traumatic brain injury with loss of consciousness status unknown, initial encounter: Secondary | ICD-10-CM | POA: Diagnosis not present

## 2022-10-27 DIAGNOSIS — S069X0S Unspecified intracranial injury without loss of consciousness, sequela: Secondary | ICD-10-CM | POA: Diagnosis not present

## 2022-10-27 DIAGNOSIS — F05 Delirium due to known physiological condition: Secondary | ICD-10-CM | POA: Diagnosis not present

## 2022-10-28 DIAGNOSIS — S0240CA Maxillary fracture, right side, initial encounter for closed fracture: Secondary | ICD-10-CM | POA: Diagnosis not present

## 2022-10-28 DIAGNOSIS — S062XAA Diffuse traumatic brain injury with loss of consciousness status unknown, initial encounter: Secondary | ICD-10-CM | POA: Diagnosis not present

## 2022-10-28 DIAGNOSIS — S22058A Other fracture of T5-T6 vertebra, initial encounter for closed fracture: Secondary | ICD-10-CM | POA: Diagnosis not present

## 2022-10-28 DIAGNOSIS — S065XAA Traumatic subdural hemorrhage with loss of consciousness status unknown, initial encounter: Secondary | ICD-10-CM | POA: Diagnosis not present

## 2022-10-29 DIAGNOSIS — S065XAA Traumatic subdural hemorrhage with loss of consciousness status unknown, initial encounter: Secondary | ICD-10-CM | POA: Diagnosis not present

## 2022-10-29 DIAGNOSIS — S12490A Other displaced fracture of fifth cervical vertebra, initial encounter for closed fracture: Secondary | ICD-10-CM | POA: Diagnosis not present

## 2022-10-29 DIAGNOSIS — R410 Disorientation, unspecified: Secondary | ICD-10-CM | POA: Diagnosis not present

## 2022-10-29 DIAGNOSIS — S12390A Other displaced fracture of fourth cervical vertebra, initial encounter for closed fracture: Secondary | ICD-10-CM | POA: Diagnosis not present

## 2022-10-29 DIAGNOSIS — R451 Restlessness and agitation: Secondary | ICD-10-CM | POA: Diagnosis not present

## 2022-10-29 DIAGNOSIS — S062XAA Diffuse traumatic brain injury with loss of consciousness status unknown, initial encounter: Secondary | ICD-10-CM | POA: Diagnosis not present

## 2022-10-30 DIAGNOSIS — R69 Illness, unspecified: Secondary | ICD-10-CM | POA: Diagnosis not present

## 2022-10-30 DIAGNOSIS — S062XAA Diffuse traumatic brain injury with loss of consciousness status unknown, initial encounter: Secondary | ICD-10-CM | POA: Diagnosis not present

## 2022-10-30 DIAGNOSIS — S065XAA Traumatic subdural hemorrhage with loss of consciousness status unknown, initial encounter: Secondary | ICD-10-CM | POA: Diagnosis not present

## 2022-10-30 DIAGNOSIS — J9600 Acute respiratory failure, unspecified whether with hypoxia or hypercapnia: Secondary | ICD-10-CM | POA: Diagnosis not present

## 2022-10-31 DIAGNOSIS — R69 Illness, unspecified: Secondary | ICD-10-CM | POA: Diagnosis not present

## 2022-10-31 DIAGNOSIS — J9600 Acute respiratory failure, unspecified whether with hypoxia or hypercapnia: Secondary | ICD-10-CM | POA: Diagnosis not present

## 2022-10-31 DIAGNOSIS — R9431 Abnormal electrocardiogram [ECG] [EKG]: Secondary | ICD-10-CM | POA: Diagnosis not present

## 2022-10-31 DIAGNOSIS — S062XAA Diffuse traumatic brain injury with loss of consciousness status unknown, initial encounter: Secondary | ICD-10-CM | POA: Diagnosis not present

## 2022-10-31 DIAGNOSIS — S065XAA Traumatic subdural hemorrhage with loss of consciousness status unknown, initial encounter: Secondary | ICD-10-CM | POA: Diagnosis not present

## 2022-11-01 DIAGNOSIS — J9601 Acute respiratory failure with hypoxia: Secondary | ICD-10-CM | POA: Diagnosis not present

## 2022-11-01 DIAGNOSIS — S22058A Other fracture of T5-T6 vertebra, initial encounter for closed fracture: Secondary | ICD-10-CM | POA: Diagnosis not present

## 2022-11-01 DIAGNOSIS — R451 Restlessness and agitation: Secondary | ICD-10-CM | POA: Diagnosis not present

## 2022-11-01 DIAGNOSIS — S065XAA Traumatic subdural hemorrhage with loss of consciousness status unknown, initial encounter: Secondary | ICD-10-CM | POA: Diagnosis not present

## 2022-11-01 DIAGNOSIS — R410 Disorientation, unspecified: Secondary | ICD-10-CM | POA: Diagnosis not present

## 2022-11-01 DIAGNOSIS — S062XAA Diffuse traumatic brain injury with loss of consciousness status unknown, initial encounter: Secondary | ICD-10-CM | POA: Diagnosis not present

## 2022-11-01 DIAGNOSIS — S069XAA Unspecified intracranial injury with loss of consciousness status unknown, initial encounter: Secondary | ICD-10-CM | POA: Diagnosis not present

## 2022-11-02 DIAGNOSIS — S069XAA Unspecified intracranial injury with loss of consciousness status unknown, initial encounter: Secondary | ICD-10-CM | POA: Diagnosis not present

## 2022-11-02 DIAGNOSIS — J9600 Acute respiratory failure, unspecified whether with hypoxia or hypercapnia: Secondary | ICD-10-CM | POA: Diagnosis not present

## 2022-11-02 DIAGNOSIS — F05 Delirium due to known physiological condition: Secondary | ICD-10-CM | POA: Diagnosis not present

## 2022-11-02 DIAGNOSIS — R69 Illness, unspecified: Secondary | ICD-10-CM | POA: Diagnosis not present

## 2022-11-02 DIAGNOSIS — S065XAA Traumatic subdural hemorrhage with loss of consciousness status unknown, initial encounter: Secondary | ICD-10-CM | POA: Diagnosis not present

## 2022-11-02 DIAGNOSIS — R1310 Dysphagia, unspecified: Secondary | ICD-10-CM | POA: Diagnosis not present

## 2022-11-02 DIAGNOSIS — S062XAA Diffuse traumatic brain injury with loss of consciousness status unknown, initial encounter: Secondary | ICD-10-CM | POA: Diagnosis not present

## 2022-11-02 DIAGNOSIS — F419 Anxiety disorder, unspecified: Secondary | ICD-10-CM | POA: Diagnosis not present

## 2022-11-03 DIAGNOSIS — G9340 Encephalopathy, unspecified: Secondary | ICD-10-CM | POA: Diagnosis not present

## 2022-11-03 DIAGNOSIS — S065XAA Traumatic subdural hemorrhage with loss of consciousness status unknown, initial encounter: Secondary | ICD-10-CM | POA: Diagnosis not present

## 2022-11-03 DIAGNOSIS — S069X0S Unspecified intracranial injury without loss of consciousness, sequela: Secondary | ICD-10-CM | POA: Diagnosis not present

## 2022-11-03 DIAGNOSIS — R410 Disorientation, unspecified: Secondary | ICD-10-CM | POA: Diagnosis not present

## 2022-11-03 DIAGNOSIS — S062XAA Diffuse traumatic brain injury with loss of consciousness status unknown, initial encounter: Secondary | ICD-10-CM | POA: Diagnosis not present

## 2022-11-03 DIAGNOSIS — R451 Restlessness and agitation: Secondary | ICD-10-CM | POA: Diagnosis not present

## 2022-11-03 DIAGNOSIS — R69 Illness, unspecified: Secondary | ICD-10-CM | POA: Diagnosis not present

## 2022-11-03 DIAGNOSIS — J9600 Acute respiratory failure, unspecified whether with hypoxia or hypercapnia: Secondary | ICD-10-CM | POA: Diagnosis not present

## 2022-11-04 DIAGNOSIS — S0003XA Contusion of scalp, initial encounter: Secondary | ICD-10-CM | POA: Diagnosis not present

## 2022-11-04 DIAGNOSIS — S065XAA Traumatic subdural hemorrhage with loss of consciousness status unknown, initial encounter: Secondary | ICD-10-CM | POA: Diagnosis not present

## 2022-11-04 DIAGNOSIS — S062XAA Diffuse traumatic brain injury with loss of consciousness status unknown, initial encounter: Secondary | ICD-10-CM | POA: Diagnosis not present

## 2022-11-04 DIAGNOSIS — I4891 Unspecified atrial fibrillation: Secondary | ICD-10-CM | POA: Diagnosis not present

## 2022-11-04 DIAGNOSIS — J9600 Acute respiratory failure, unspecified whether with hypoxia or hypercapnia: Secondary | ICD-10-CM | POA: Diagnosis not present

## 2022-11-04 DIAGNOSIS — R69 Illness, unspecified: Secondary | ICD-10-CM | POA: Diagnosis not present

## 2022-11-04 DIAGNOSIS — S12401A Unspecified nondisplaced fracture of fifth cervical vertebra, initial encounter for closed fracture: Secondary | ICD-10-CM | POA: Diagnosis not present

## 2022-11-05 DIAGNOSIS — J9600 Acute respiratory failure, unspecified whether with hypoxia or hypercapnia: Secondary | ICD-10-CM | POA: Diagnosis not present

## 2022-11-05 DIAGNOSIS — S069XAA Unspecified intracranial injury with loss of consciousness status unknown, initial encounter: Secondary | ICD-10-CM | POA: Diagnosis not present

## 2022-11-05 DIAGNOSIS — S065X9D Traumatic subdural hemorrhage with loss of consciousness of unspecified duration, subsequent encounter: Secondary | ICD-10-CM | POA: Diagnosis not present

## 2022-11-05 DIAGNOSIS — S0240CD Maxillary fracture, right side, subsequent encounter for fracture with routine healing: Secondary | ICD-10-CM | POA: Diagnosis not present

## 2022-11-05 DIAGNOSIS — S065XAA Traumatic subdural hemorrhage with loss of consciousness status unknown, initial encounter: Secondary | ICD-10-CM | POA: Diagnosis not present

## 2022-11-05 DIAGNOSIS — G9340 Encephalopathy, unspecified: Secondary | ICD-10-CM | POA: Diagnosis not present

## 2022-11-05 DIAGNOSIS — R69 Illness, unspecified: Secondary | ICD-10-CM | POA: Diagnosis not present

## 2022-11-05 DIAGNOSIS — R451 Restlessness and agitation: Secondary | ICD-10-CM | POA: Diagnosis not present

## 2022-11-05 DIAGNOSIS — S12390D Other displaced fracture of fourth cervical vertebra, subsequent encounter for fracture with routine healing: Secondary | ICD-10-CM | POA: Diagnosis not present

## 2022-11-05 DIAGNOSIS — S062XAA Diffuse traumatic brain injury with loss of consciousness status unknown, initial encounter: Secondary | ICD-10-CM | POA: Diagnosis not present

## 2022-11-05 DIAGNOSIS — F05 Delirium due to known physiological condition: Secondary | ICD-10-CM | POA: Diagnosis not present

## 2022-11-05 DIAGNOSIS — S062X9D Diffuse traumatic brain injury with loss of consciousness of unspecified duration, subsequent encounter: Secondary | ICD-10-CM | POA: Diagnosis not present

## 2022-11-05 DIAGNOSIS — R410 Disorientation, unspecified: Secondary | ICD-10-CM | POA: Diagnosis not present

## 2022-11-05 DIAGNOSIS — G931 Anoxic brain damage, not elsewhere classified: Secondary | ICD-10-CM | POA: Diagnosis not present

## 2022-11-05 DIAGNOSIS — S12490D Other displaced fracture of fifth cervical vertebra, subsequent encounter for fracture with routine healing: Secondary | ICD-10-CM | POA: Diagnosis not present

## 2022-11-06 DIAGNOSIS — S065XAA Traumatic subdural hemorrhage with loss of consciousness status unknown, initial encounter: Secondary | ICD-10-CM | POA: Diagnosis not present

## 2022-11-06 DIAGNOSIS — R69 Illness, unspecified: Secondary | ICD-10-CM | POA: Diagnosis not present

## 2022-11-06 DIAGNOSIS — Z931 Gastrostomy status: Secondary | ICD-10-CM | POA: Diagnosis not present

## 2022-11-06 DIAGNOSIS — J9600 Acute respiratory failure, unspecified whether with hypoxia or hypercapnia: Secondary | ICD-10-CM | POA: Diagnosis not present

## 2022-11-06 DIAGNOSIS — N200 Calculus of kidney: Secondary | ICD-10-CM | POA: Diagnosis not present

## 2022-11-06 DIAGNOSIS — S062XAA Diffuse traumatic brain injury with loss of consciousness status unknown, initial encounter: Secondary | ICD-10-CM | POA: Diagnosis not present

## 2022-11-07 DIAGNOSIS — S065XAA Traumatic subdural hemorrhage with loss of consciousness status unknown, initial encounter: Secondary | ICD-10-CM | POA: Diagnosis not present

## 2022-11-07 DIAGNOSIS — R69 Illness, unspecified: Secondary | ICD-10-CM | POA: Diagnosis not present

## 2022-11-07 DIAGNOSIS — S062XAA Diffuse traumatic brain injury with loss of consciousness status unknown, initial encounter: Secondary | ICD-10-CM | POA: Diagnosis not present

## 2022-11-07 DIAGNOSIS — J9600 Acute respiratory failure, unspecified whether with hypoxia or hypercapnia: Secondary | ICD-10-CM | POA: Diagnosis not present

## 2022-11-08 DIAGNOSIS — G9340 Encephalopathy, unspecified: Secondary | ICD-10-CM | POA: Diagnosis not present

## 2022-11-08 DIAGNOSIS — R69 Illness, unspecified: Secondary | ICD-10-CM | POA: Diagnosis not present

## 2022-11-08 DIAGNOSIS — S065XAA Traumatic subdural hemorrhage with loss of consciousness status unknown, initial encounter: Secondary | ICD-10-CM | POA: Diagnosis not present

## 2022-11-08 DIAGNOSIS — Z930 Tracheostomy status: Secondary | ICD-10-CM | POA: Diagnosis not present

## 2022-11-08 DIAGNOSIS — S062XAA Diffuse traumatic brain injury with loss of consciousness status unknown, initial encounter: Secondary | ICD-10-CM | POA: Diagnosis not present

## 2022-11-08 DIAGNOSIS — S069XAS Unspecified intracranial injury with loss of consciousness status unknown, sequela: Secondary | ICD-10-CM | POA: Diagnosis not present

## 2022-11-08 DIAGNOSIS — J9600 Acute respiratory failure, unspecified whether with hypoxia or hypercapnia: Secondary | ICD-10-CM | POA: Diagnosis not present

## 2022-11-09 DIAGNOSIS — S065XAA Traumatic subdural hemorrhage with loss of consciousness status unknown, initial encounter: Secondary | ICD-10-CM | POA: Diagnosis not present

## 2022-11-09 DIAGNOSIS — J9600 Acute respiratory failure, unspecified whether with hypoxia or hypercapnia: Secondary | ICD-10-CM | POA: Diagnosis not present

## 2022-11-09 DIAGNOSIS — S062XAA Diffuse traumatic brain injury with loss of consciousness status unknown, initial encounter: Secondary | ICD-10-CM | POA: Diagnosis not present

## 2022-11-09 DIAGNOSIS — R69 Illness, unspecified: Secondary | ICD-10-CM | POA: Diagnosis not present

## 2022-11-10 DIAGNOSIS — S12401A Unspecified nondisplaced fracture of fifth cervical vertebra, initial encounter for closed fracture: Secondary | ICD-10-CM | POA: Diagnosis not present

## 2022-11-10 DIAGNOSIS — S065XAA Traumatic subdural hemorrhage with loss of consciousness status unknown, initial encounter: Secondary | ICD-10-CM | POA: Diagnosis not present

## 2022-11-10 DIAGNOSIS — R69 Illness, unspecified: Secondary | ICD-10-CM | POA: Diagnosis not present

## 2022-11-10 DIAGNOSIS — J9600 Acute respiratory failure, unspecified whether with hypoxia or hypercapnia: Secondary | ICD-10-CM | POA: Diagnosis not present

## 2022-11-10 DIAGNOSIS — S062XAA Diffuse traumatic brain injury with loss of consciousness status unknown, initial encounter: Secondary | ICD-10-CM | POA: Diagnosis not present

## 2022-11-10 DIAGNOSIS — I4891 Unspecified atrial fibrillation: Secondary | ICD-10-CM | POA: Diagnosis not present

## 2022-11-11 DIAGNOSIS — S0240CA Maxillary fracture, right side, initial encounter for closed fracture: Secondary | ICD-10-CM | POA: Diagnosis not present

## 2022-11-11 DIAGNOSIS — S062XAA Diffuse traumatic brain injury with loss of consciousness status unknown, initial encounter: Secondary | ICD-10-CM | POA: Diagnosis not present

## 2022-11-11 DIAGNOSIS — R69 Illness, unspecified: Secondary | ICD-10-CM | POA: Diagnosis not present

## 2022-11-11 DIAGNOSIS — R410 Disorientation, unspecified: Secondary | ICD-10-CM | POA: Diagnosis not present

## 2022-11-11 DIAGNOSIS — S065XAA Traumatic subdural hemorrhage with loss of consciousness status unknown, initial encounter: Secondary | ICD-10-CM | POA: Diagnosis not present

## 2022-11-11 DIAGNOSIS — S069X0S Unspecified intracranial injury without loss of consciousness, sequela: Secondary | ICD-10-CM | POA: Diagnosis not present

## 2022-11-11 DIAGNOSIS — G479 Sleep disorder, unspecified: Secondary | ICD-10-CM | POA: Diagnosis not present

## 2022-11-11 DIAGNOSIS — J9600 Acute respiratory failure, unspecified whether with hypoxia or hypercapnia: Secondary | ICD-10-CM | POA: Diagnosis not present

## 2022-11-12 DIAGNOSIS — Z7409 Other reduced mobility: Secondary | ICD-10-CM | POA: Diagnosis not present

## 2022-11-12 DIAGNOSIS — S065XAA Traumatic subdural hemorrhage with loss of consciousness status unknown, initial encounter: Secondary | ICD-10-CM | POA: Diagnosis not present

## 2022-11-12 DIAGNOSIS — S062XAD Diffuse traumatic brain injury with loss of consciousness status unknown, subsequent encounter: Secondary | ICD-10-CM | POA: Diagnosis not present

## 2022-11-12 DIAGNOSIS — S062XAA Diffuse traumatic brain injury with loss of consciousness status unknown, initial encounter: Secondary | ICD-10-CM | POA: Diagnosis not present

## 2022-11-12 DIAGNOSIS — R69 Illness, unspecified: Secondary | ICD-10-CM | POA: Diagnosis not present

## 2022-11-12 DIAGNOSIS — Z9181 History of falling: Secondary | ICD-10-CM | POA: Diagnosis not present

## 2022-11-12 DIAGNOSIS — R4189 Other symptoms and signs involving cognitive functions and awareness: Secondary | ICD-10-CM | POA: Diagnosis not present

## 2022-11-12 DIAGNOSIS — Z789 Other specified health status: Secondary | ICD-10-CM | POA: Diagnosis not present

## 2022-11-12 DIAGNOSIS — Z9189 Other specified personal risk factors, not elsewhere classified: Secondary | ICD-10-CM | POA: Diagnosis not present

## 2022-11-12 DIAGNOSIS — S0240CA Maxillary fracture, right side, initial encounter for closed fracture: Secondary | ICD-10-CM | POA: Diagnosis not present

## 2022-11-12 DIAGNOSIS — I4891 Unspecified atrial fibrillation: Secondary | ICD-10-CM | POA: Diagnosis not present

## 2022-11-13 DIAGNOSIS — S065XAA Traumatic subdural hemorrhage with loss of consciousness status unknown, initial encounter: Secondary | ICD-10-CM | POA: Diagnosis not present

## 2022-11-13 DIAGNOSIS — S062XAA Diffuse traumatic brain injury with loss of consciousness status unknown, initial encounter: Secondary | ICD-10-CM | POA: Diagnosis not present

## 2022-11-13 DIAGNOSIS — Z931 Gastrostomy status: Secondary | ICD-10-CM | POA: Diagnosis not present

## 2022-11-13 DIAGNOSIS — N200 Calculus of kidney: Secondary | ICD-10-CM | POA: Diagnosis not present

## 2022-11-13 DIAGNOSIS — G4736 Sleep related hypoventilation in conditions classified elsewhere: Secondary | ICD-10-CM | POA: Diagnosis not present

## 2022-11-13 DIAGNOSIS — R0902 Hypoxemia: Secondary | ICD-10-CM | POA: Diagnosis not present

## 2022-11-13 DIAGNOSIS — R69 Illness, unspecified: Secondary | ICD-10-CM | POA: Diagnosis not present

## 2022-11-14 DIAGNOSIS — R451 Restlessness and agitation: Secondary | ICD-10-CM | POA: Diagnosis not present

## 2022-11-14 DIAGNOSIS — S0240CA Maxillary fracture, right side, initial encounter for closed fracture: Secondary | ICD-10-CM | POA: Diagnosis not present

## 2022-11-14 DIAGNOSIS — G9340 Encephalopathy, unspecified: Secondary | ICD-10-CM | POA: Diagnosis not present

## 2022-11-14 DIAGNOSIS — S065XAA Traumatic subdural hemorrhage with loss of consciousness status unknown, initial encounter: Secondary | ICD-10-CM | POA: Diagnosis not present

## 2022-11-14 DIAGNOSIS — R69 Illness, unspecified: Secondary | ICD-10-CM | POA: Diagnosis not present

## 2022-11-14 DIAGNOSIS — S062XAA Diffuse traumatic brain injury with loss of consciousness status unknown, initial encounter: Secondary | ICD-10-CM | POA: Diagnosis not present

## 2022-11-14 DIAGNOSIS — R410 Disorientation, unspecified: Secondary | ICD-10-CM | POA: Diagnosis not present

## 2022-11-15 DIAGNOSIS — S0240CA Maxillary fracture, right side, initial encounter for closed fracture: Secondary | ICD-10-CM | POA: Diagnosis not present

## 2022-11-15 DIAGNOSIS — R451 Restlessness and agitation: Secondary | ICD-10-CM | POA: Diagnosis not present

## 2022-11-15 DIAGNOSIS — S065XAA Traumatic subdural hemorrhage with loss of consciousness status unknown, initial encounter: Secondary | ICD-10-CM | POA: Diagnosis not present

## 2022-11-15 DIAGNOSIS — R69 Illness, unspecified: Secondary | ICD-10-CM | POA: Diagnosis not present

## 2022-11-15 DIAGNOSIS — S062XAA Diffuse traumatic brain injury with loss of consciousness status unknown, initial encounter: Secondary | ICD-10-CM | POA: Diagnosis not present

## 2022-11-16 DIAGNOSIS — S065XAA Traumatic subdural hemorrhage with loss of consciousness status unknown, initial encounter: Secondary | ICD-10-CM | POA: Diagnosis not present

## 2022-11-16 DIAGNOSIS — R69 Illness, unspecified: Secondary | ICD-10-CM | POA: Diagnosis not present

## 2022-11-16 DIAGNOSIS — S0240CA Maxillary fracture, right side, initial encounter for closed fracture: Secondary | ICD-10-CM | POA: Diagnosis not present

## 2022-11-16 DIAGNOSIS — S062X0A Diffuse traumatic brain injury without loss of consciousness, initial encounter: Secondary | ICD-10-CM | POA: Diagnosis not present

## 2022-11-17 DIAGNOSIS — S062XAA Diffuse traumatic brain injury with loss of consciousness status unknown, initial encounter: Secondary | ICD-10-CM | POA: Diagnosis not present

## 2022-11-17 DIAGNOSIS — R69 Illness, unspecified: Secondary | ICD-10-CM | POA: Diagnosis not present

## 2022-11-17 DIAGNOSIS — I4891 Unspecified atrial fibrillation: Secondary | ICD-10-CM | POA: Diagnosis not present

## 2022-11-17 DIAGNOSIS — G9340 Encephalopathy, unspecified: Secondary | ICD-10-CM | POA: Diagnosis not present

## 2022-11-17 DIAGNOSIS — F05 Delirium due to known physiological condition: Secondary | ICD-10-CM | POA: Diagnosis not present

## 2022-11-17 DIAGNOSIS — S12401A Unspecified nondisplaced fracture of fifth cervical vertebra, initial encounter for closed fracture: Secondary | ICD-10-CM | POA: Diagnosis not present

## 2022-11-17 DIAGNOSIS — R451 Restlessness and agitation: Secondary | ICD-10-CM | POA: Diagnosis not present

## 2022-11-17 DIAGNOSIS — S065XAA Traumatic subdural hemorrhage with loss of consciousness status unknown, initial encounter: Secondary | ICD-10-CM | POA: Diagnosis not present

## 2022-11-18 DIAGNOSIS — S062X9D Diffuse traumatic brain injury with loss of consciousness of unspecified duration, subsequent encounter: Secondary | ICD-10-CM | POA: Diagnosis not present

## 2022-11-18 DIAGNOSIS — S12390D Other displaced fracture of fourth cervical vertebra, subsequent encounter for fracture with routine healing: Secondary | ICD-10-CM | POA: Diagnosis not present

## 2022-11-18 DIAGNOSIS — G931 Anoxic brain damage, not elsewhere classified: Secondary | ICD-10-CM | POA: Diagnosis not present

## 2022-11-18 DIAGNOSIS — R410 Disorientation, unspecified: Secondary | ICD-10-CM | POA: Diagnosis not present

## 2022-11-18 DIAGNOSIS — S12490D Other displaced fracture of fifth cervical vertebra, subsequent encounter for fracture with routine healing: Secondary | ICD-10-CM | POA: Diagnosis not present

## 2022-11-18 DIAGNOSIS — R69 Illness, unspecified: Secondary | ICD-10-CM | POA: Diagnosis not present

## 2022-11-18 DIAGNOSIS — S065X9D Traumatic subdural hemorrhage with loss of consciousness of unspecified duration, subsequent encounter: Secondary | ICD-10-CM | POA: Diagnosis not present

## 2022-11-18 DIAGNOSIS — S065XAA Traumatic subdural hemorrhage with loss of consciousness status unknown, initial encounter: Secondary | ICD-10-CM | POA: Diagnosis not present

## 2022-11-18 DIAGNOSIS — S062X0A Diffuse traumatic brain injury without loss of consciousness, initial encounter: Secondary | ICD-10-CM | POA: Diagnosis not present

## 2022-11-18 DIAGNOSIS — S0240CD Maxillary fracture, right side, subsequent encounter for fracture with routine healing: Secondary | ICD-10-CM | POA: Diagnosis not present

## 2022-11-18 DIAGNOSIS — I4891 Unspecified atrial fibrillation: Secondary | ICD-10-CM | POA: Diagnosis not present

## 2022-11-19 DIAGNOSIS — R69 Illness, unspecified: Secondary | ICD-10-CM | POA: Diagnosis not present

## 2022-11-19 DIAGNOSIS — S062XAA Diffuse traumatic brain injury with loss of consciousness status unknown, initial encounter: Secondary | ICD-10-CM | POA: Diagnosis not present

## 2022-11-19 DIAGNOSIS — S065XAA Traumatic subdural hemorrhage with loss of consciousness status unknown, initial encounter: Secondary | ICD-10-CM | POA: Diagnosis not present

## 2022-11-19 DIAGNOSIS — I4891 Unspecified atrial fibrillation: Secondary | ICD-10-CM | POA: Diagnosis not present

## 2022-11-20 DIAGNOSIS — S065XAA Traumatic subdural hemorrhage with loss of consciousness status unknown, initial encounter: Secondary | ICD-10-CM | POA: Diagnosis not present

## 2022-11-20 DIAGNOSIS — R69 Illness, unspecified: Secondary | ICD-10-CM | POA: Diagnosis not present

## 2022-11-20 DIAGNOSIS — S22058A Other fracture of T5-T6 vertebra, initial encounter for closed fracture: Secondary | ICD-10-CM | POA: Diagnosis not present

## 2022-11-20 DIAGNOSIS — S062XAA Diffuse traumatic brain injury with loss of consciousness status unknown, initial encounter: Secondary | ICD-10-CM | POA: Diagnosis not present

## 2022-11-21 DIAGNOSIS — K4020 Bilateral inguinal hernia, without obstruction or gangrene, not specified as recurrent: Secondary | ICD-10-CM | POA: Diagnosis not present

## 2022-11-21 DIAGNOSIS — S065X9D Traumatic subdural hemorrhage with loss of consciousness of unspecified duration, subsequent encounter: Secondary | ICD-10-CM | POA: Diagnosis not present

## 2022-11-21 DIAGNOSIS — Z993 Dependence on wheelchair: Secondary | ICD-10-CM | POA: Diagnosis not present

## 2022-11-21 DIAGNOSIS — K439 Ventral hernia without obstruction or gangrene: Secondary | ICD-10-CM | POA: Diagnosis not present

## 2022-11-21 DIAGNOSIS — S065XAD Traumatic subdural hemorrhage with loss of consciousness status unknown, subsequent encounter: Secondary | ICD-10-CM | POA: Diagnosis not present

## 2022-11-21 DIAGNOSIS — S12300D Unspecified displaced fracture of fourth cervical vertebra, subsequent encounter for fracture with routine healing: Secondary | ICD-10-CM | POA: Diagnosis not present

## 2022-11-21 DIAGNOSIS — N400 Enlarged prostate without lower urinary tract symptoms: Secondary | ICD-10-CM | POA: Diagnosis not present

## 2022-11-21 DIAGNOSIS — M4316 Spondylolisthesis, lumbar region: Secondary | ICD-10-CM | POA: Diagnosis not present

## 2022-11-21 DIAGNOSIS — S062X9S Diffuse traumatic brain injury with loss of consciousness of unspecified duration, sequela: Secondary | ICD-10-CM | POA: Diagnosis not present

## 2022-11-21 DIAGNOSIS — S129XXA Fracture of neck, unspecified, initial encounter: Secondary | ICD-10-CM | POA: Diagnosis not present

## 2022-11-21 DIAGNOSIS — M5030 Other cervical disc degeneration, unspecified cervical region: Secondary | ICD-10-CM | POA: Diagnosis not present

## 2022-11-21 DIAGNOSIS — R451 Restlessness and agitation: Secondary | ICD-10-CM | POA: Diagnosis not present

## 2022-11-21 DIAGNOSIS — S0240CA Maxillary fracture, right side, initial encounter for closed fracture: Secondary | ICD-10-CM | POA: Diagnosis not present

## 2022-11-21 DIAGNOSIS — S12401A Unspecified nondisplaced fracture of fifth cervical vertebra, initial encounter for closed fracture: Secondary | ICD-10-CM | POA: Diagnosis not present

## 2022-11-21 DIAGNOSIS — S062X9D Diffuse traumatic brain injury with loss of consciousness of unspecified duration, subsequent encounter: Secondary | ICD-10-CM | POA: Diagnosis not present

## 2022-11-21 DIAGNOSIS — H9190 Unspecified hearing loss, unspecified ear: Secondary | ICD-10-CM | POA: Diagnosis not present

## 2022-11-21 DIAGNOSIS — N281 Cyst of kidney, acquired: Secondary | ICD-10-CM | POA: Diagnosis not present

## 2022-11-21 DIAGNOSIS — I2510 Atherosclerotic heart disease of native coronary artery without angina pectoris: Secondary | ICD-10-CM | POA: Diagnosis not present

## 2022-11-21 DIAGNOSIS — K219 Gastro-esophageal reflux disease without esophagitis: Secondary | ICD-10-CM | POA: Diagnosis not present

## 2022-11-21 DIAGNOSIS — R9431 Abnormal electrocardiogram [ECG] [EKG]: Secondary | ICD-10-CM | POA: Diagnosis not present

## 2022-11-21 DIAGNOSIS — S12390D Other displaced fracture of fourth cervical vertebra, subsequent encounter for fracture with routine healing: Secondary | ICD-10-CM | POA: Diagnosis not present

## 2022-11-21 DIAGNOSIS — R4189 Other symptoms and signs involving cognitive functions and awareness: Secondary | ICD-10-CM | POA: Diagnosis not present

## 2022-11-21 DIAGNOSIS — I7774 Dissection of vertebral artery: Secondary | ICD-10-CM | POA: Diagnosis not present

## 2022-11-21 DIAGNOSIS — M6281 Muscle weakness (generalized): Secondary | ICD-10-CM | POA: Diagnosis not present

## 2022-11-21 DIAGNOSIS — R69 Illness, unspecified: Secondary | ICD-10-CM | POA: Diagnosis not present

## 2022-11-21 DIAGNOSIS — R42 Dizziness and giddiness: Secondary | ICD-10-CM | POA: Diagnosis not present

## 2022-11-21 DIAGNOSIS — S0240CD Maxillary fracture, right side, subsequent encounter for fracture with routine healing: Secondary | ICD-10-CM | POA: Diagnosis not present

## 2022-11-21 DIAGNOSIS — Z7409 Other reduced mobility: Secondary | ICD-10-CM | POA: Diagnosis not present

## 2022-11-21 DIAGNOSIS — S062XAD Diffuse traumatic brain injury with loss of consciousness status unknown, subsequent encounter: Secondary | ICD-10-CM | POA: Diagnosis not present

## 2022-11-21 DIAGNOSIS — Z951 Presence of aortocoronary bypass graft: Secondary | ICD-10-CM | POA: Diagnosis not present

## 2022-11-21 DIAGNOSIS — I10 Essential (primary) hypertension: Secondary | ICD-10-CM | POA: Diagnosis not present

## 2022-11-21 DIAGNOSIS — Z931 Gastrostomy status: Secondary | ICD-10-CM | POA: Diagnosis not present

## 2022-11-21 DIAGNOSIS — G931 Anoxic brain damage, not elsewhere classified: Secondary | ICD-10-CM | POA: Diagnosis not present

## 2022-11-21 DIAGNOSIS — S12400D Unspecified displaced fracture of fifth cervical vertebra, subsequent encounter for fracture with routine healing: Secondary | ICD-10-CM | POA: Diagnosis not present

## 2022-11-21 DIAGNOSIS — S12300A Unspecified displaced fracture of fourth cervical vertebra, initial encounter for closed fracture: Secondary | ICD-10-CM | POA: Diagnosis not present

## 2022-11-21 DIAGNOSIS — S069XAA Unspecified intracranial injury with loss of consciousness status unknown, initial encounter: Secondary | ICD-10-CM | POA: Diagnosis not present

## 2022-11-21 DIAGNOSIS — M109 Gout, unspecified: Secondary | ICD-10-CM | POA: Diagnosis not present

## 2022-11-21 DIAGNOSIS — I4891 Unspecified atrial fibrillation: Secondary | ICD-10-CM | POA: Diagnosis not present

## 2022-11-21 DIAGNOSIS — I6502 Occlusion and stenosis of left vertebral artery: Secondary | ICD-10-CM | POA: Diagnosis not present

## 2022-11-21 DIAGNOSIS — G629 Polyneuropathy, unspecified: Secondary | ICD-10-CM | POA: Diagnosis not present

## 2022-11-21 DIAGNOSIS — S065XAA Traumatic subdural hemorrhage with loss of consciousness status unknown, initial encounter: Secondary | ICD-10-CM | POA: Diagnosis not present

## 2022-11-21 DIAGNOSIS — Z930 Tracheostomy status: Secondary | ICD-10-CM | POA: Diagnosis not present

## 2022-11-21 DIAGNOSIS — R410 Disorientation, unspecified: Secondary | ICD-10-CM | POA: Diagnosis not present

## 2022-11-21 DIAGNOSIS — S062XAA Diffuse traumatic brain injury with loss of consciousness status unknown, initial encounter: Secondary | ICD-10-CM | POA: Diagnosis not present

## 2022-11-21 DIAGNOSIS — S12490D Other displaced fracture of fifth cervical vertebra, subsequent encounter for fracture with routine healing: Secondary | ICD-10-CM | POA: Diagnosis not present

## 2022-12-06 DIAGNOSIS — N400 Enlarged prostate without lower urinary tract symptoms: Secondary | ICD-10-CM | POA: Diagnosis not present

## 2022-12-06 DIAGNOSIS — G931 Anoxic brain damage, not elsewhere classified: Secondary | ICD-10-CM | POA: Diagnosis not present

## 2022-12-06 DIAGNOSIS — K219 Gastro-esophageal reflux disease without esophagitis: Secondary | ICD-10-CM | POA: Diagnosis not present

## 2022-12-06 DIAGNOSIS — S12390D Other displaced fracture of fourth cervical vertebra, subsequent encounter for fracture with routine healing: Secondary | ICD-10-CM | POA: Diagnosis not present

## 2022-12-06 DIAGNOSIS — S12401A Unspecified nondisplaced fracture of fifth cervical vertebra, initial encounter for closed fracture: Secondary | ICD-10-CM | POA: Diagnosis not present

## 2022-12-06 DIAGNOSIS — Z951 Presence of aortocoronary bypass graft: Secondary | ICD-10-CM | POA: Diagnosis not present

## 2022-12-06 DIAGNOSIS — S062X9D Diffuse traumatic brain injury with loss of consciousness of unspecified duration, subsequent encounter: Secondary | ICD-10-CM | POA: Diagnosis not present

## 2022-12-06 DIAGNOSIS — S0240CD Maxillary fracture, right side, subsequent encounter for fracture with routine healing: Secondary | ICD-10-CM | POA: Diagnosis not present

## 2022-12-06 DIAGNOSIS — S12300A Unspecified displaced fracture of fourth cervical vertebra, initial encounter for closed fracture: Secondary | ICD-10-CM | POA: Diagnosis not present

## 2022-12-06 DIAGNOSIS — S065X9D Traumatic subdural hemorrhage with loss of consciousness of unspecified duration, subsequent encounter: Secondary | ICD-10-CM | POA: Diagnosis not present

## 2022-12-06 DIAGNOSIS — M5030 Other cervical disc degeneration, unspecified cervical region: Secondary | ICD-10-CM | POA: Diagnosis not present

## 2022-12-06 DIAGNOSIS — R69 Illness, unspecified: Secondary | ICD-10-CM | POA: Diagnosis not present

## 2022-12-06 DIAGNOSIS — I10 Essential (primary) hypertension: Secondary | ICD-10-CM | POA: Diagnosis not present

## 2022-12-06 DIAGNOSIS — I4891 Unspecified atrial fibrillation: Secondary | ICD-10-CM | POA: Diagnosis not present

## 2022-12-06 DIAGNOSIS — I2510 Atherosclerotic heart disease of native coronary artery without angina pectoris: Secondary | ICD-10-CM | POA: Diagnosis not present

## 2022-12-27 DIAGNOSIS — I7774 Dissection of vertebral artery: Secondary | ICD-10-CM | POA: Diagnosis not present

## 2022-12-27 DIAGNOSIS — I2510 Atherosclerotic heart disease of native coronary artery without angina pectoris: Secondary | ICD-10-CM | POA: Diagnosis not present

## 2022-12-27 DIAGNOSIS — E1169 Type 2 diabetes mellitus with other specified complication: Secondary | ICD-10-CM | POA: Diagnosis not present

## 2022-12-27 DIAGNOSIS — E559 Vitamin D deficiency, unspecified: Secondary | ICD-10-CM | POA: Diagnosis not present

## 2022-12-27 DIAGNOSIS — S0242XD Fracture of alveolus of maxilla, subsequent encounter for fracture with routine healing: Secondary | ICD-10-CM | POA: Diagnosis not present

## 2022-12-27 DIAGNOSIS — Z79899 Other long term (current) drug therapy: Secondary | ICD-10-CM | POA: Diagnosis not present

## 2022-12-27 DIAGNOSIS — I480 Paroxysmal atrial fibrillation: Secondary | ICD-10-CM | POA: Diagnosis not present

## 2022-12-27 DIAGNOSIS — I129 Hypertensive chronic kidney disease with stage 1 through stage 4 chronic kidney disease, or unspecified chronic kidney disease: Secondary | ICD-10-CM | POA: Diagnosis not present

## 2022-12-27 DIAGNOSIS — N1831 Chronic kidney disease, stage 3a: Secondary | ICD-10-CM | POA: Diagnosis not present

## 2022-12-27 DIAGNOSIS — M545 Low back pain, unspecified: Secondary | ICD-10-CM | POA: Diagnosis not present

## 2022-12-27 DIAGNOSIS — K219 Gastro-esophageal reflux disease without esophagitis: Secondary | ICD-10-CM | POA: Diagnosis not present

## 2022-12-27 DIAGNOSIS — S069X0S Unspecified intracranial injury without loss of consciousness, sequela: Secondary | ICD-10-CM | POA: Diagnosis not present

## 2022-12-27 DIAGNOSIS — E782 Mixed hyperlipidemia: Secondary | ICD-10-CM | POA: Diagnosis not present

## 2022-12-27 DIAGNOSIS — E118 Type 2 diabetes mellitus with unspecified complications: Secondary | ICD-10-CM | POA: Diagnosis not present

## 2023-03-02 DIAGNOSIS — H11001 Unspecified pterygium of right eye: Secondary | ICD-10-CM | POA: Diagnosis not present

## 2023-03-02 DIAGNOSIS — H11002 Unspecified pterygium of left eye: Secondary | ICD-10-CM | POA: Diagnosis not present

## 2023-04-05 DIAGNOSIS — K219 Gastro-esophageal reflux disease without esophagitis: Secondary | ICD-10-CM | POA: Diagnosis not present

## 2023-04-05 DIAGNOSIS — R600 Localized edema: Secondary | ICD-10-CM | POA: Diagnosis not present

## 2023-04-05 DIAGNOSIS — I10 Essential (primary) hypertension: Secondary | ICD-10-CM | POA: Diagnosis not present

## 2023-04-05 DIAGNOSIS — R490 Dysphonia: Secondary | ICD-10-CM | POA: Diagnosis not present

## 2023-04-22 DIAGNOSIS — E782 Mixed hyperlipidemia: Secondary | ICD-10-CM | POA: Diagnosis not present

## 2023-04-22 DIAGNOSIS — E538 Deficiency of other specified B group vitamins: Secondary | ICD-10-CM | POA: Diagnosis not present

## 2023-04-22 DIAGNOSIS — E118 Type 2 diabetes mellitus with unspecified complications: Secondary | ICD-10-CM | POA: Diagnosis not present

## 2023-04-22 DIAGNOSIS — E559 Vitamin D deficiency, unspecified: Secondary | ICD-10-CM | POA: Diagnosis not present

## 2023-04-27 DIAGNOSIS — S129XXS Fracture of neck, unspecified, sequela: Secondary | ICD-10-CM | POA: Diagnosis not present

## 2023-04-27 DIAGNOSIS — I7774 Dissection of vertebral artery: Secondary | ICD-10-CM | POA: Diagnosis not present

## 2023-04-27 DIAGNOSIS — N1831 Chronic kidney disease, stage 3a: Secondary | ICD-10-CM | POA: Diagnosis not present

## 2023-04-27 DIAGNOSIS — R490 Dysphonia: Secondary | ICD-10-CM | POA: Diagnosis not present

## 2023-04-27 DIAGNOSIS — S069X0S Unspecified intracranial injury without loss of consciousness, sequela: Secondary | ICD-10-CM | POA: Diagnosis not present

## 2023-04-27 DIAGNOSIS — E782 Mixed hyperlipidemia: Secondary | ICD-10-CM | POA: Diagnosis not present

## 2023-04-27 DIAGNOSIS — E1122 Type 2 diabetes mellitus with diabetic chronic kidney disease: Secondary | ICD-10-CM | POA: Diagnosis not present

## 2023-04-27 DIAGNOSIS — R600 Localized edema: Secondary | ICD-10-CM | POA: Diagnosis not present

## 2023-04-27 DIAGNOSIS — I10 Essential (primary) hypertension: Secondary | ICD-10-CM | POA: Diagnosis not present

## 2023-04-27 DIAGNOSIS — S0242XD Fracture of alveolus of maxilla, subsequent encounter for fracture with routine healing: Secondary | ICD-10-CM | POA: Diagnosis not present

## 2023-04-27 DIAGNOSIS — I129 Hypertensive chronic kidney disease with stage 1 through stage 4 chronic kidney disease, or unspecified chronic kidney disease: Secondary | ICD-10-CM | POA: Diagnosis not present

## 2023-04-29 DIAGNOSIS — R490 Dysphonia: Secondary | ICD-10-CM | POA: Diagnosis not present

## 2023-04-29 DIAGNOSIS — K219 Gastro-esophageal reflux disease without esophagitis: Secondary | ICD-10-CM | POA: Diagnosis not present

## 2023-05-10 DIAGNOSIS — Z23 Encounter for immunization: Secondary | ICD-10-CM | POA: Diagnosis not present

## 2023-06-06 DIAGNOSIS — H26492 Other secondary cataract, left eye: Secondary | ICD-10-CM | POA: Diagnosis not present

## 2023-06-06 DIAGNOSIS — Z961 Presence of intraocular lens: Secondary | ICD-10-CM | POA: Diagnosis not present

## 2023-06-09 DIAGNOSIS — K219 Gastro-esophageal reflux disease without esophagitis: Secondary | ICD-10-CM | POA: Diagnosis not present

## 2023-06-09 DIAGNOSIS — K5900 Constipation, unspecified: Secondary | ICD-10-CM | POA: Diagnosis not present

## 2023-06-09 DIAGNOSIS — I10 Essential (primary) hypertension: Secondary | ICD-10-CM | POA: Diagnosis not present

## 2023-06-16 DIAGNOSIS — H52223 Regular astigmatism, bilateral: Secondary | ICD-10-CM | POA: Diagnosis not present

## 2023-06-16 DIAGNOSIS — H5203 Hypermetropia, bilateral: Secondary | ICD-10-CM | POA: Diagnosis not present
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
## 2023-06-17 ENCOUNTER — Encounter: Payer: Self-pay | Admitting: Gastroenterology

## 2023-06-17 ENCOUNTER — Ambulatory Visit: Payer: Medicare HMO | Admitting: Gastroenterology

## 2023-06-17 VITALS — BP 129/82 | HR 79 | Temp 97.7°F | Ht 70.5 in | Wt 240.2 lb

## 2023-06-17 DIAGNOSIS — K5900 Constipation, unspecified: Secondary | ICD-10-CM

## 2023-06-17 NOTE — Patient Instructions (Addendum)
Start MiraLAX 1 capful (17 g) daily in 8 ounces of water or other noncarbonated beverage of your choice.  Drink at least 64 oz of water daily.   Follow a high-fiber diet with fruits, vegetables, whole grains.  I will plan to see back in the office in 3 months or sooner if needed.  Ermalinda Memos, PA-C [facility]

## 2023-06-18 ENCOUNTER — Encounter: Payer: Self-pay | Admitting: Gastroenterology

## 2023-08-25 DIAGNOSIS — E538 Deficiency of other specified B group vitamins: Secondary | ICD-10-CM | POA: Diagnosis not present

## 2023-08-25 DIAGNOSIS — E782 Mixed hyperlipidemia: Secondary | ICD-10-CM | POA: Diagnosis not present

## 2023-08-25 DIAGNOSIS — Z125 Encounter for screening for malignant neoplasm of prostate: Secondary | ICD-10-CM | POA: Diagnosis not present

## 2023-08-25 DIAGNOSIS — E559 Vitamin D deficiency, unspecified: Secondary | ICD-10-CM | POA: Diagnosis not present

## 2023-08-25 DIAGNOSIS — E118 Type 2 diabetes mellitus with unspecified complications: Secondary | ICD-10-CM | POA: Diagnosis not present

## 2023-08-29 DIAGNOSIS — K5900 Constipation, unspecified: Secondary | ICD-10-CM | POA: Diagnosis not present

## 2023-08-29 DIAGNOSIS — I10 Essential (primary) hypertension: Secondary | ICD-10-CM | POA: Diagnosis not present

## 2023-08-29 DIAGNOSIS — S0242XD Fracture of alveolus of maxilla, subsequent encounter for fracture with routine healing: Secondary | ICD-10-CM | POA: Diagnosis not present

## 2023-08-29 DIAGNOSIS — R490 Dysphonia: Secondary | ICD-10-CM | POA: Diagnosis not present

## 2023-08-29 DIAGNOSIS — I7774 Dissection of vertebral artery: Secondary | ICD-10-CM | POA: Diagnosis not present

## 2023-08-29 DIAGNOSIS — Z0001 Encounter for general adult medical examination with abnormal findings: Secondary | ICD-10-CM | POA: Diagnosis not present

## 2023-08-29 DIAGNOSIS — S069X0S Unspecified intracranial injury without loss of consciousness, sequela: Secondary | ICD-10-CM | POA: Diagnosis not present

## 2023-08-29 DIAGNOSIS — I480 Paroxysmal atrial fibrillation: Secondary | ICD-10-CM | POA: Diagnosis not present

## 2023-08-29 DIAGNOSIS — R600 Localized edema: Secondary | ICD-10-CM | POA: Diagnosis not present

## 2023-08-29 DIAGNOSIS — N5089 Other specified disorders of the male genital organs: Secondary | ICD-10-CM | POA: Diagnosis not present

## 2023-08-29 DIAGNOSIS — K219 Gastro-esophageal reflux disease without esophagitis: Secondary | ICD-10-CM | POA: Diagnosis not present

## 2023-08-29 DIAGNOSIS — S129XXS Fracture of neck, unspecified, sequela: Secondary | ICD-10-CM | POA: Diagnosis not present
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
## 2023-09-14 ENCOUNTER — Encounter: Payer: Self-pay | Admitting: Gastroenterology

## 2023-09-14 ENCOUNTER — Ambulatory Visit: Payer: Medicare HMO | Admitting: Gastroenterology
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
## 2023-09-22 ENCOUNTER — Encounter: Payer: Self-pay | Admitting: Gastroenterology

## 2023-09-22 ENCOUNTER — Telehealth: Payer: Self-pay | Admitting: *Deleted

## 2023-09-22 ENCOUNTER — Ambulatory Visit: Payer: Medicare HMO | Admitting: Gastroenterology

## 2023-09-22 VITALS — BP 134/89 | HR 62 | Temp 98.3°F | Ht 71.0 in | Wt 238.2 lb

## 2023-09-22 DIAGNOSIS — R194 Change in bowel habit: Secondary | ICD-10-CM

## 2023-09-22 NOTE — Telephone Encounter (Signed)
LMOVM to call back to schedule tcs w/ propofol asa 3, Dr. Marletta Lor

## 2023-09-22 NOTE — Patient Instructions (Signed)
We will get you scheduled for colonoscopy in the near future with Dr. Marletta Lor.  Please have lab work completed with Dr. Margo Aye.  Please have results faxed back to [place].  Increase Metamucil to twice daily.  Avoid common gas producing items including broccoli, cabbage, cauliflower, beans, Brussels sprouts, artificial sweeteners, drinking through a straw, hard candy, chewing gum.  I will see back after the colonoscopy.  Ermalinda Memos, PA-C [facility]

## 2023-09-23 ENCOUNTER — Encounter: Payer: Self-pay | Admitting: *Deleted

## 2023-09-23 ENCOUNTER — Other Ambulatory Visit: Payer: Self-pay | Admitting: *Deleted

## 2023-09-23 MED ORDER — PEG 3350-KCL-NA BICARB-NACL 420 G PO SOLR: 4000.0000 mL | Freq: Once | ORAL | 0 refills | Status: AC

## 2023-09-23 NOTE — Telephone Encounter (Signed)
Pt has been scheduled for 10/17/23. Instructions mailed and prep sent to the pharmacy

## 2023-09-27 ENCOUNTER — Encounter: Payer: Self-pay | Admitting: *Deleted

## 2023-09-28 ENCOUNTER — Telehealth: Payer: Self-pay | Admitting: Gastroenterology

## 2023-09-28 NOTE — Telephone Encounter (Signed)
Reviewed labs completed 09/22/2023.  TSH within normal limits.  Celiac screen negative.  No additional recommendations at this time.  Will proceed with colonoscopy as planned.

## 2023-10-07 ENCOUNTER — Ambulatory Visit: Payer: Medicare HMO | Admitting: Urology

## 2023-10-07 VITALS — BP 137/87 | HR 71

## 2023-10-07 DIAGNOSIS — N5089 Other specified disorders of the male genital organs: Secondary | ICD-10-CM

## 2023-10-07 DIAGNOSIS — N432 Other hydrocele: Secondary | ICD-10-CM

## 2023-10-07 LAB — URINALYSIS, ROUTINE W REFLEX MICROSCOPIC
Bilirubin, UA: NEGATIVE
Glucose, UA: NEGATIVE
Ketones, UA: NEGATIVE
Leukocytes,UA: NEGATIVE
Nitrite, UA: NEGATIVE
Protein,UA: NEGATIVE
RBC, UA: NEGATIVE
Specific Gravity, UA: 1.01 (ref 1.005–1.030)
Urobilinogen, Ur: 0.2 mg/dL (ref 0.2–1.0)
pH, UA: 6.5 (ref 5.0–7.5)

## 2023-10-07 NOTE — Progress Notes (Signed)
 10/07/2023 10:03 AM   Darius Grimes [id number]  Referring provider: Shona Norleen PEDLAR, MD [address] [name],  [place] [address]  Scrotal swelling  HPI: [name] is a 77yo here for evaluation of left scrotal swelling. He had a left hydrocelectomy over 20 years ago. He has noted progressive left scrotal swelling over the past 5 years. No issues urinating related to the hydrocele. No pain with the hydrocele. No other complaints today   PMH: Past Medical History:  Diagnosis Date   Acid reflux    Allergic rhinitis    Arthritis    Coronary artery disease    Dysrhythmia    Episodic atrial fibrillation (HCC)    GERD (gastroesophageal reflux disease)    Heart palpitations    History of kidney stones    Hyperlipidemia    Hypertension    Kidney stones    Neck pain    Recurrent nephrolithiasis    Skin lesion of back     Surgical History: Past Surgical History:  Procedure Laterality Date   CARPAL TUNNEL RELEASE Left    CATARACT EXTRACTION W/PHACO Left 02/01/2020   Procedure: CATARACT EXTRACTION PHACO AND INTRAOCULAR LENS PLACEMENT (IOC);  Surgeon: Darius Agent, MD;  Location: [facility];  Service: Ophthalmology;  Laterality: Left;  CDE: 8.89   CATARACT EXTRACTION W/PHACO Left 02/15/2020   Procedure: REMOVAL OF RETAINED LENS FRAGMENTS LEFT EYE;  Surgeon: Darius Agent, MD;  Location: [facility];  Service: Ophthalmology;  Laterality: Left;   CATARACT EXTRACTION W/PHACO Right 05/26/2020   Procedure: CATARACT EXTRACTION PHACO AND INTRAOCULAR LENS PLACEMENT RIGHT EYE;  Surgeon: Darius Agent, MD;  Location: [facility];  Service: Ophthalmology;  Laterality: Right;  CDE: 6.38   COLONOSCOPY  2007   Tubular adenomas   COLONOSCOPY  2012   Tubular adenomas   COLONOSCOPY  2017   Normal ileum, diverticulosis, normal rectum, no polyps.   COLONOSCOPY WITH PROPOFOL  N/A 01/08/2022   Procedure: COLONOSCOPY WITH PROPOFOL ;  Surgeon: Darius Carlin POUR, DO;  [facility];   Service: Endoscopy;  Laterality: N/A;  8:00am   CORONARY ARTERY BYPASS GRAFT  2016   HYDROCELE EXCISION / REPAIR     KNEE ARTHROPLASTY Left 07/04/2017   Procedure: COMPUTER ASSISTED TOTAL KNEE ARTHROPLASTY;  Surgeon: Darius Grimes SQUIBB, MD;  Location: [facility] OR[facility];  Service: Orthopedics;  Laterality: Left;   POLYPECTOMY  01/08/2022   Procedure: POLYPECTOMY;  Surgeon: Darius Carlin POUR, DO;  [facility];  Service: Endoscopy;;   THROAT SURGERY     URETERAL STENT PLACEMENT     at the [facility]   Vocal cord poylp removal      Home Medications:  Allergies as of 10/07/2023       Reactions   Alfuzosin Other (See Comments)   Pt is unaware of allergy    Pravastatin  Other (See Comments)   Joint pain    Terazosin Other (See Comments)   Pt is unaware of allergy        Medication List        Accurate as of October 07, 2023 10:03 AM. If you have any questions, ask your nurse or doctor.          STOP taking these medications    albuterol 108 (90 Base) MCG/ACT inhaler Commonly known as: VENTOLIN HFA   docusate sodium  100 MG capsule Commonly known as: COLACE   fluticasone  50 MCG/ACT nasal spray Commonly known as: FLONASE    LORazepam  0.5 MG tablet Commonly known  as: ATIVAN        TAKE these medications    acetaminophen  650 MG CR tablet Commonly known as: TYLENOL  Take 650 mg by mouth as needed for pain.   allopurinol  300 MG tablet Commonly known as: ZYLOPRIM  Take 1 tablet (300 mg total) by mouth daily. What changed:  when to take this reasons to take this   amLODipine  10 MG tablet Commonly known as: NORVASC  Take 10 mg by mouth at bedtime.   aspirin  EC 81 MG tablet Take 81 mg by mouth daily. Swallow whole.   atorvastatin  10 MG tablet Commonly known as: LIPITOR Take 10 mg by mouth daily.   cyanocobalamin  1000 MCG tablet Commonly known as: VITAMIN B12 Take 2,000 mcg by mouth daily.   finasteride  5 MG tablet Commonly known as: PROSCAR  Take 5 mg by mouth daily.    Fish Oil  1200 MG Caps Take 2 capsules (2,400 mg total) by mouth in the morning and at bedtime.   losartan 100 MG tablet Commonly known as: COZAAR Take 100 mg by mouth daily. Take 1 daily   meloxicam 15 MG tablet Commonly known as: MOBIC Take 15 mg by mouth daily.   METAMUCIL FIBER PO Take 1 Scoop by mouth every other day.   multivitamin with minerals Tabs tablet Take 1 tablet by mouth daily.   pantoprazole  40 MG tablet Commonly known as: PROTONIX  Take 40 mg by mouth daily.   Potassium Chloride  ER 20 MEQ Tbcr Take 20-40 mEq by mouth See admin instructions. Take 40 mEq by mouth in the morning and 20 mEq in the evening   potassium chloride  SA 20 MEQ tablet Commonly known as: KLOR-CON  M Take 1 tablet by mouth daily.   propranolol  10 MG tablet Commonly known as: INDERAL  Take by mouth.   tamsulosin  0.4 MG Caps capsule Commonly known as: FLOMAX  Take 0.4 mg by mouth daily.   torsemide 20 MG tablet Commonly known as: DEMADEX Take 20-40 mg by mouth See admin instructions. Take 40 mg by mouth in the morning and 20 mg in the evening        Allergies:  Allergies  Allergen Reactions   Alfuzosin Other (See Comments)    Pt is unaware of allergy    Pravastatin  Other (See Comments)    Joint pain    Terazosin Other (See Comments)    Pt is unaware of allergy    Family History: Family History  Problem Relation Age of Onset   CVA Mother    Hypotension Mother    CAD Father    Heart attack Father    Hypertension Sister    Hypertension Brother    Obesity Brother    Colon cancer Neg Hx     Social History:  reports that he quit smoking about 40 years ago. His smoking use included cigarettes. He started smoking about 47 years ago. He has a 7 pack-year smoking history. He has never used smokeless tobacco. He reports that he does not drink alcohol and does not use drugs.  ROS: All other review of systems were reviewed and are negative except what is noted above in  HPI  Physical Exam: BP 137/87   Pulse 71   Constitutional:  Alert and oriented, No acute distress. HEENT: [place] AT, moist mucus membranes.  Trachea midline, no masses. Cardiovascular: No clubbing, cyanosis, or edema. Respiratory: Normal respiratory effort, no increased work of breathing. GI: Abdomen is soft, nontender, nondistended, no abdominal masses GU: No CVA tenderness. Circumcised phallus. No masses/lesions on penis,  testis, scrotum. Left scrotal swelling Lymph: No cervical or inguinal lymphadenopathy. Skin: No rashes, bruises or suspicious lesions. Neurologic: Grossly intact, no focal deficits, moving all 4 extremities. Psychiatric: Normal mood and affect.  Laboratory Data: Lab Results  Component Value Date   WBC 9.6 07/06/2017   HGB 12.3 (L) 07/06/2017   HCT 35.8 (L) 07/06/2017   MCV 91.6 07/06/2017   PLT 169 07/06/2017    Lab Results  Component Value Date   CREATININE 1.37 (H) 01/06/2022    Lab Results  Component Value Date   PSA 0.8 10/22/2016   PSA 1.5-Normal 06/23/2012    No results found for: TESTOSTERONE  Lab Results  Component Value Date   HGBA1C 5.7 (H) 04/25/2017    Urinalysis    Component Value Date/Time   COLORURINE YELLOW (A) 06/22/2017 0921   APPEARANCEUR CLEAR (A) 06/22/2017 0921   LABSPEC 1.008 06/22/2017 0921   PHURINE 5.0 06/22/2017 0921   GLUCOSEU NEGATIVE 06/22/2017 0921   HGBUR NEGATIVE 06/22/2017 0921   BILIRUBINUR negative 04/18/2021 0830   KETONESUR negative 04/18/2021 0830   KETONESUR NEGATIVE 06/22/2017 0921   PROTEINUR negative 04/18/2021 0830   PROTEINUR NEGATIVE 06/22/2017 0921   UROBILINOGEN 0.2 04/18/2021 0830   NITRITE Negative 04/18/2021 0830   NITRITE NEGATIVE 06/22/2017 0921   LEUKOCYTESUR Small (1+) (A) 04/18/2021 0830    No results found for: LABMICR, WBCUA, RBCUA, LABEPIT, MUCUS, BACTERIA  Pertinent Imaging:  No results found for this or any previous visit.  No results found for this or any  previous visit.  No results found for this or any previous visit.  No results found for this or any previous visit.  No results found for this or any previous visit.  No results found for this or any previous visit.  No results found for this or any previous visit.  No results found for this or any previous visit.   Assessment & Plan:    1. Testicular swelling (Primary) Scrotal US  - Urinalysis, Routine w reflex microscopic  2. Other hydrocele Scrotal US    No follow-ups on file.  Belvie Clara, MD  [facility] [place]

## 2023-10-11 ENCOUNTER — Encounter: Payer: Self-pay | Admitting: Urology

## 2023-10-11 NOTE — Patient Instructions (Signed)
Hydrocele, Adult A hydrocele is a collection of fluid in the loose pouch of skin that holds the testicles (scrotum). It can occur in one or both testicles. This may happen because: The amount of fluid produced in the scrotum is not absorbed by the rest of the body. Fluid from the abdomen fills the scrotum. Normally, the testicles develop in the abdomen and then drop into the scrotum before birth. The tube that the testicles travel through usually closes after the testicles drop. If the tube does not close, fluid from the abdomen can fill the scrotum. This is not very common in adults. What are the causes? A hydrocele may be caused by: An injury to the scrotum. An infection. Decreased blood flow to the scrotum. Twisting of a testicle (testicular torsion). A birth defect. A tumor or cancer of the testicle. Sometimes, the cause is not known. What are the signs or symptoms? A hydrocele feels like a water-filled balloon. It may also feel heavy. Other symptoms include: Swelling of the scrotum. The swelling may decrease when you lie down. You may also notice more swelling at night than in the morning. This is called a communicating hydrocele, in which the fluid in the scrotum goes back into the abdominal cavity when the position of the scrotum changes. Swelling of the groin. Mild discomfort in the scrotum. Pain. This can develop if the hydrocele was caused by infection or twisting. The larger the hydrocele, the more likely you are to have pain. Swelling may also cause pain. How is this diagnosed? This condition may be diagnosed based on a physical exam and your medical history. You may also have tests, including: Imaging tests, such as an ultrasound. A transillumination test. This test takes place in a dark room where a light is placed on the skin of the scrotum. Clear liquid will not impede the light and the scrotum will be illuminated. This helps a health care provider distinguish a hydrocele from a  tumor. Blood or urine tests. How is this treated? Most hydroceles go away on their own. If you have no discomfort or pain, your health care provider may suggest close monitoring of your condition until the condition goes away or symptoms develop. This is called watch and wait or watchful waiting. If treatment is needed, it may include: Treating an underlying condition. This may include taking an antibiotic medicine to treat an infection. Having surgery to stop fluid from collecting in the scrotum. Having surgery to drain the fluid. Surgery may include: Hydrocelectomy. For this procedure, an incision is made in the scrotum to remove the fluid. Needle aspiration. A needle is used to drain fluid. However, the fluid buildup will come back quickly and may lead to an infection of the scrotum. This is rarely done. Follow these instructions at home: Medicines Take over-the-counter and prescription medicines only as told by your health care provider. If you were prescribed an antibiotic medicine, take it as told by your health care provider. Do not stop taking the antibiotic even if you start to feel better. General instructions Watch the hydrocele for any changes. Keep all follow-up visits. This is important. Contact a health care provider if: You notice any changes in the hydrocele. The swelling in your scrotum or groin gets worse. The hydrocele becomes red, firm, painful, or tender to the touch. You have a fever. Get help right away if you: Develop a lot of pain or your pain becomes worse. Have chills. Have a high fever. Summary A hydrocele is   a collection of fluid in the loose pouch of skin that holds the testicles (scrotum). A hydrocele can cause swelling, discomfort, and pain. In adults, the cause of a hydrocele may not be known. However, it is sometimes caused by an infection or the twisting of a testicle. Hydroceles often go away on their own. If a hydrocele causes pain, treating the  underlying cause may be needed to ease the pain. This information is not intended to replace advice given to you by your health care provider. Make sure you discuss any questions you have with your health care provider. Document Revised: 04/02/2021 Document Reviewed: 04/02/2021 Elsevier Patient Education  2024 Elsevier Inc.  

## 2023-10-11 NOTE — Patient Instructions (Signed)
Darius Grimes  10/11/2023     @PREFPERIOPPHARMACY @   Your procedure is scheduled on  10/17/2023.   Report to [facility] at  0940 A.M.   Call this number if you have problems the morning of surgery:  [phone]  If you experience any cold or flu symptoms such as cough, fever, chills, shortness of breath, etc. between now and your scheduled surgery, please notify us at the above number.   Remember:  Follow the diet and prep instructions given to you by the office.   You may drink clear liquids until 0740 am on 10/17/2023.   Clear liquids allowed are:                    Water, Juice (No red color; non-citric and without pulp; diabetics please choose diet or no sugar options), Carbonated beverages (diabetics please choose diet or no sugar options), Clear Tea (No creamer, milk, or cream, including half & half and powdered creamer), Black Coffee Only (No creamer, milk or cream, including half & half and powdered creamer), Plain Jell-O Only (No red color; diabetics please choose no sugar options), Clear Sports drink (No red color; diabetics please choose diet or no sugar options), and Plain Popsicles Only (No red color; diabetics please choose no sugar options)    Take these medicines the morning of surgery with A SIP OF WATER        amlodipine, meloxicam, pantoprazole, propranolol.     Do not wear jewelry, make-up or nail polish, including gel polish,  artificial nails, or any other type of covering on natural nails (fingers and  toes).  Do not wear lotions, powders, or perfumes, or deodorant.  Do not shave 48 hours prior to surgery.  Men may shave face and neck.  Do not bring valuables to the hospital.  [facility] is not responsible for any belongings or valuables.  Contacts, dentures or bridgework may not be worn into surgery.  Leave your suitcase in the car.  After surgery it may be brought to your room.  For patients admitted to the hospital, discharge time will be  determined by your treatment team.  Patients discharged the day of surgery will not be allowed to drive home and must have someone with them for 24 hours.    Special instructions:   DO NOT smoke tobacco or vape for 24 hours before your procedure.  Please read over the following fact sheets that you were given. Coughing and Deep Breathing, Anesthesia Post-op Instructions, and Care and Recovery After Surgery      Colonoscopy, Adult, Care After The following information offers guidance on how to care for yourself after your procedure. Your health care provider may also give you more specific instructions. If you have problems or questions, contact your health care provider. What can I expect after the procedure? After the procedure, it is common to have: A small amount of blood in your stool for 24 hours after the procedure. Some gas. Mild cramping or bloating of your abdomen. Follow these instructions at home: Eating and drinking  Drink enough fluid to keep your urine pale yellow. Follow instructions from your health care provider about eating or drinking restrictions. Resume your normal diet as told by your health care provider. Avoid heavy or fried foods that are hard to digest. Activity Rest as told by your health care provider. Avoid sitting for a long time without moving. Get up to take short walks  every 1-2 hours. This is important to improve blood flow and breathing. Ask for help if you feel weak or unsteady. Return to your normal activities as told by your health care provider. Ask your health care provider what activities are safe for you. Managing cramping and bloating  Try walking around when you have cramps or feel bloated. If directed, apply heat to your abdomen as told by your health care provider. Use the heat source that your health care provider recommends, such as a moist heat pack or a heating pad. Place a towel between your skin and the heat source. Leave the heat  on for 20-30 minutes. Remove the heat if your skin turns bright red. This is especially important if you are unable to feel pain, heat, or cold. You have a greater risk of getting burned. General instructions If you were given a sedative during the procedure, it can affect you for several hours. Do not drive or operate machinery until your health care provider says that it is safe. For the first 24 hours after the procedure: Do not sign important documents. Do not drink alcohol. Do your regular daily activities at a slower pace than normal. Eat soft foods that are easy to digest. Take over-the-counter and prescription medicines only as told by your health care provider. Keep all follow-up visits. This is important. Contact a health care provider if: You have blood in your stool 2-3 days after the procedure. Get help right away if: You have more than a small spotting of blood in your stool. You have large blood clots in your stool. You have swelling of your abdomen. You have nausea or vomiting. You have a fever. You have increasing pain in your abdomen that is not relieved with medicine. These symptoms may be an emergency. Get help right away. Call 911. Do not wait to see if the symptoms will go away. Do not drive yourself to the hospital. Summary After the procedure, it is common to have a small amount of blood in your stool. You may also have mild cramping and bloating of your abdomen. If you were given a sedative during the procedure, it can affect you for several hours. Do not drive or operate machinery until your health care provider says that it is safe. Get help right away if you have a lot of blood in your stool, nausea or vomiting, a fever, or increased pain in your abdomen. This information is not intended to replace advice given to you by your health care provider. Make sure you discuss any questions you have with your health care provider. Document Revised: 09/28/2022 Document  Reviewed: 04/08/2021 Elsevier Patient Education  2024 Elsevier Inc.Monitored Anesthesia Care, Care After The following information offers guidance on how to care for yourself after your procedure. Your health care provider may also give you more specific instructions. If you have problems or questions, contact your health care provider. What can I expect after the procedure? After the procedure, it is common to have: Tiredness. Little or no memory about what happened during or after the procedure. Impaired judgment when it comes to making decisions. Nausea or vomiting. Some trouble with balance. Follow these instructions at home: For the time period you were told by your health care provider:  Rest. Do not participate in activities where you could fall or become injured. Do not drive or use machinery. Do not drink alcohol. Do not take sleeping pills or medicines that cause drowsiness. Do not make important decisions or  sign legal documents. Do not take care of children on your own. Medicines Take over-the-counter and prescription medicines only as told by your health care provider. If you were prescribed antibiotics, take them as told by your health care provider. Do not stop using the antibiotic even if you start to feel better. Eating and drinking Follow instructions from your health care provider about what you may eat and drink. Drink enough fluid to keep your urine pale yellow. If you vomit: Drink clear fluids slowly and in small amounts as you are able. Clear fluids include water, ice chips, low-calorie sports drinks, and fruit juice that has water added to it (diluted fruit juice). Eat light and bland foods in small amounts as you are able. These foods include bananas, applesauce, rice, lean meats, toast, and crackers. General instructions  Have a responsible adult stay with you for the time you are told. It is important to have someone help care for you until you are awake and  alert. If you have sleep apnea, surgery and some medicines can increase your risk for breathing problems. Follow instructions from your health care provider about wearing your sleep device: When you are sleeping. This includes during daytime naps. While taking prescription pain medicines, sleeping medicines, or medicines that make you drowsy. Do not use any products that contain nicotine or tobacco. These products include cigarettes, chewing tobacco, and vaping devices, such as e-cigarettes. If you need help quitting, ask your health care provider. Contact a health care provider if: You feel nauseous or vomit every time you eat or drink. You feel light-headed. You are still sleepy or having trouble with balance after 24 hours. You get a rash. You have a fever. You have redness or swelling around the IV site. Get help right away if: You have trouble breathing. You have new confusion after you get home. These symptoms may be an emergency. Get help right away. Call 911. Do not wait to see if the symptoms will go away. Do not drive yourself to the hospital. This information is not intended to replace advice given to you by your health care provider. Make sure you discuss any questions you have with your health care provider. Document Revised: 01/11/2022 Document Reviewed: 01/11/2022 Elsevier Patient Education  2024 [employer].

## 2023-10-12 ENCOUNTER — Encounter (HOSPITAL_COMMUNITY)
Admission: RE | Admit: 2023-10-12 | Discharge: 2023-10-12 | Disposition: A | Discharge status: Home / Self Care | Payer: Medicare HMO | Source: Ambulatory Visit | Attending: Internal Medicine

## 2023-10-12 ENCOUNTER — Encounter (HOSPITAL_COMMUNITY): Payer: Self-pay

## 2023-10-12 VITALS — BP 137/87 | HR 71 | Temp 98.3°F | Resp 18 | Ht 71.0 in | Wt 238.2 lb

## 2023-10-12 DIAGNOSIS — Z79899 Other long term (current) drug therapy: Secondary | ICD-10-CM | POA: Insufficient documentation

## 2023-10-12 DIAGNOSIS — Z01818 Encounter for other preprocedural examination: Secondary | ICD-10-CM | POA: Diagnosis not present

## 2023-10-12 DIAGNOSIS — I10 Essential (primary) hypertension: Secondary | ICD-10-CM | POA: Diagnosis not present

## 2023-10-12 DIAGNOSIS — I480 Paroxysmal atrial fibrillation: Secondary | ICD-10-CM | POA: Insufficient documentation

## 2023-10-12 LAB — BASIC METABOLIC PANEL
Anion gap: 9 (ref 5–15)
BUN: 16 mg/dL (ref 8–23)
CO2: 28 mmol/L (ref 22–32)
Calcium: 9.5 mg/dL (ref 8.9–10.3)
Chloride: 104 mmol/L (ref 98–111)
Creatinine, Ser: 0.96 mg/dL (ref 0.61–1.24)
GFR, Estimated: >60 mL/min (ref >60)
Glucose, Bld: 116 mg/dL — ABNORMAL HIGH (ref 70–99)
Potassium: 3.6 mmol/L (ref 3.5–5.1)
Sodium: 141 mmol/L (ref 135–145)

## 2023-10-14 ENCOUNTER — Ambulatory Visit (HOSPITAL_COMMUNITY)
Admission: RE | Admit: 2023-10-14 | Discharge: 2023-10-14 | Disposition: A | Discharge status: Home / Self Care | Payer: Medicare HMO | Source: Ambulatory Visit | Attending: Urology | Admitting: Urology

## 2023-10-14 DIAGNOSIS — N432 Other hydrocele: Secondary | ICD-10-CM | POA: Insufficient documentation

## 2023-10-14 DIAGNOSIS — N5089 Other specified disorders of the male genital organs: Secondary | ICD-10-CM | POA: Diagnosis not present

## 2023-10-14 DIAGNOSIS — N4342 Spermatocele of epididymis, multiple: Secondary | ICD-10-CM | POA: Diagnosis not present

## 2023-10-17 ENCOUNTER — Ambulatory Visit (HOSPITAL_COMMUNITY)
Admission: RE | Admit: 2023-10-17 | Discharge: 2023-10-17 | Disposition: A | Discharge status: Home / Self Care | Payer: Medicare HMO | Attending: Internal Medicine | Admitting: Internal Medicine

## 2023-10-17 ENCOUNTER — Ambulatory Visit (HOSPITAL_COMMUNITY): Payer: Medicare HMO | Admitting: Anesthesiology

## 2023-10-17 ENCOUNTER — Encounter (HOSPITAL_COMMUNITY)
Admission: RE | Disposition: A | Discharge status: Home / Self Care | Payer: Self-pay | Source: Home / Self Care | Attending: Internal Medicine

## 2023-10-17 ENCOUNTER — Encounter (HOSPITAL_COMMUNITY): Payer: Self-pay | Admitting: Internal Medicine

## 2023-10-17 DIAGNOSIS — Z87891 Personal history of nicotine dependence: Secondary | ICD-10-CM | POA: Diagnosis not present

## 2023-10-17 DIAGNOSIS — R194 Change in bowel habit: Secondary | ICD-10-CM | POA: Insufficient documentation

## 2023-10-17 DIAGNOSIS — K5730 Diverticulosis of large intestine without perforation or abscess without bleeding: Secondary | ICD-10-CM | POA: Insufficient documentation

## 2023-10-17 DIAGNOSIS — E785 Hyperlipidemia, unspecified: Secondary | ICD-10-CM | POA: Insufficient documentation

## 2023-10-17 DIAGNOSIS — Z951 Presence of aortocoronary bypass graft: Secondary | ICD-10-CM | POA: Diagnosis not present

## 2023-10-17 DIAGNOSIS — I2510 Atherosclerotic heart disease of native coronary artery without angina pectoris: Secondary | ICD-10-CM

## 2023-10-17 DIAGNOSIS — K648 Other hemorrhoids: Secondary | ICD-10-CM | POA: Insufficient documentation

## 2023-10-17 DIAGNOSIS — Z860101 Personal history of adenomatous and serrated colon polyps: Secondary | ICD-10-CM | POA: Diagnosis not present

## 2023-10-17 DIAGNOSIS — K649 Unspecified hemorrhoids: Secondary | ICD-10-CM

## 2023-10-17 DIAGNOSIS — K219 Gastro-esophageal reflux disease without esophagitis: Secondary | ICD-10-CM | POA: Diagnosis not present

## 2023-10-17 DIAGNOSIS — I10 Essential (primary) hypertension: Secondary | ICD-10-CM | POA: Diagnosis not present

## 2023-10-17 DIAGNOSIS — M109 Gout, unspecified: Secondary | ICD-10-CM | POA: Diagnosis not present

## 2023-10-17 HISTORY — PX: COLONOSCOPY WITH PROPOFOL: SHX5780

## 2023-10-17 SURGERY — COLONOSCOPY WITH PROPOFOL
Anesthesia: General

## 2023-10-17 MED ORDER — PROPOFOL 500 MG/50ML IV EMUL
INTRAVENOUS | Status: DC | PRN
  Administered 2023-10-17: 60 mg via INTRAVENOUS
  Administered 2023-10-17: 100 ug/kg/min via INTRAVENOUS

## 2023-10-17 MED ORDER — LIDOCAINE HCL (CARDIAC) PF 100 MG/5ML IV SOSY
PREFILLED_SYRINGE | INTRAVENOUS | Status: DC | PRN
Start: 2023-10-17 — End: 2023-10-17
  Administered 2023-10-17: 100 mg via INTRAVENOUS

## 2023-10-17 MED ORDER — LACTATED RINGERS IV SOLN: INTRAVENOUS | Status: DC

## 2023-10-17 MED ORDER — LACTATED RINGERS IV SOLN
INTRAVENOUS | Status: DC | PRN
Start: 2023-10-17 — End: 2023-10-17

## 2023-10-17 MED ORDER — STERILE WATER FOR IRRIGATION IR SOLN
Status: DC | PRN
  Administered 2023-10-17: 60 mL

## 2023-10-17 NOTE — Transfer of Care (Signed)
 Immediate Anesthesia Transfer of Care Note  Patient: PRECIOUS GILCHREST  Procedure(s) Performed: COLONOSCOPY WITH PROPOFOL  Patient Location: PACU and Endoscopy Unit  Anesthesia Type:MAC  Level of Consciousness: drowsy  Airway & Oxygen Therapy: Patient Spontanous Breathing  Post-op Assessment: Report given to RN and Post -op Vital signs reviewed and stable  Post vital signs: Reviewed and stable  Last Vitals:  Vitals Value Taken Time  BP 98/55 10/17/23 1246  Temp 36.7 C 10/17/23 1244  Pulse 70 10/17/23 1244  Resp 18 10/17/23 1244  SpO2 96 % 10/17/23 1244    Last Pain:  Vitals:   10/17/23 1244  TempSrc:   PainSc: Asleep         Complications: No notable events documented.

## 2023-10-17 NOTE — Op Note (Signed)
 [facility] Patient Name: Darius Grimes Procedure Date: 10/17/2023 9:38 AM MRN: [id number] Date of Birth: 1946-07-15 Attending MD: Hennie Duos. Marletta Lor , [place], [id number] CSN: [id number] Age: 78 Admit Type: Outpatient Procedure:                Colonoscopy Indications:              Change in bowel habits Providers:                Hennie Duos. Marletta Lor, DO, Angelica Ran, Dyann Ruddle Referring MD:              Medicines:                See the Anesthesia note for documentation of the                            administered medications Complications:            No immediate complications. Estimated Blood Loss:     Estimated blood loss: none. Procedure:                Pre-Anesthesia Assessment:                           - The anesthesia plan was to use monitored                            anesthesia care (MAC).                           After obtaining informed consent, the colonoscope                            was passed under direct vision. Throughout the                            procedure, the patient's blood pressure, pulse, and                            oxygen saturations were monitored continuously. The                            [id number]-HQ190L ([id number]) scope was introduced through                            the anus and advanced to the the cecum, identified                            by appendiceal orifice and ileocecal valve. The                            colonoscopy was performed without difficulty. The                            patient tolerated the procedure well. The quality                            of the  bowel preparation was evaluated using the                            BBPS [facility] Bowel Preparation Scale) with scores                            of: Right Colon = 2 (minor amount of residual                            staining, small fragments of stool and/or opaque                            liquid, but mucosa seen well), Transverse Colon = 3                             (entire mucosa seen well with no residual staining,                            small fragments of stool or opaque liquid) and Left                            Colon = 2 (minor amount of residual staining, small                            fragments of stool and/or opaque liquid, but mucosa                            seen well). The total BBPS score equals 7. The                            quality of the bowel preparation was good. Scope In: 12:24:34 PM Scope Out: 12:36:13 PM Scope Withdrawal Time: 0 hours 9 minutes 7 seconds  Total Procedure Duration: 0 hours 11 minutes 39 seconds  Findings:      Non-bleeding internal hemorrhoids were found during retroflexion.      Multiple large-mouthed and small-mouthed diverticula were found in the       sigmoid colon and descending colon.      The exam was otherwise without abnormality. Impression:               - Non-bleeding internal hemorrhoids.                           - Diverticulosis in the sigmoid colon and in the                            descending colon.                           - The examination was otherwise normal.                           - No specimens collected. Moderate Sedation:      Per Anesthesia Care Recommendation:           -  Patient has a contact number available for                            emergencies. The signs and symptoms of potential                            delayed complications were discussed with the                            patient. Return to normal activities tomorrow.                            Written discharge instructions were provided to the                            patient.                           - Resume previous diet.                           - Continue present medications.                           - No repeat colonoscopy due to age.                           - Return to GI clinic in 3 months. Procedure Code(s):        --- Professional ---                           [phone], Colonoscopy,  flexible; diagnostic, including                            collection of specimen(s) by brushing or washing,                            when performed (separate procedure) Diagnosis Code(s):        --- Professional ---                           K64.8, Other hemorrhoids                           R19.4, Change in bowel habit                           K57.30, Diverticulosis of large intestine without                            perforation or abscess without bleeding CPT copyright 2022 American Medical Association. All rights reserved. The codes documented in this report are preliminary and upon coder review may  be revised to meet current compliance requirements. Hennie Duos. Marletta Lor, DO Hennie Duos. Marletta Lor, DO 10/17/2023 12:39:46 PM This report has been signed electronically. Number of Addenda: 0

## 2023-10-17 NOTE — Discharge Instructions (Addendum)
  Colonoscopy Discharge Instructions  Read the instructions outlined below and refer to this sheet in the next few weeks. These discharge instructions provide you with general information on caring for yourself after you leave the hospital. Your doctor may also give you specific instructions. While your treatment has been planned according to the most current medical practices available, unavoidable complications occasionally occur.   ACTIVITY You may resume your regular activity, but move at a slower pace for the next 24 hours.  Take frequent rest periods for the next 24 hours.  Walking will help get rid of the air and reduce the bloated feeling in your belly (abdomen).  No driving for 24 hours (because of the medicine (anesthesia) used during the test).   Do not sign any important legal documents or operate any machinery for 24 hours (because of the anesthesia used during the test).  NUTRITION Drink plenty of fluids.  You may resume your normal diet as instructed by your doctor.  Begin with a light meal and progress to your normal diet. Heavy or fried foods are harder to digest and may make you feel sick to your stomach (nauseated).  Avoid alcoholic beverages for 24 hours or as instructed.  MEDICATIONS You may resume your normal medications unless your doctor tells you otherwise.  WHAT YOU CAN EXPECT TODAY Some feelings of bloating in the abdomen.  Passage of more gas than usual.  Spotting of blood in your stool or on the toilet paper.  IF YOU HAD POLYPS REMOVED DURING THE COLONOSCOPY: No aspirin products for 7 days or as instructed.  No alcohol for 7 days or as instructed.  Eat a soft diet for the next 24 hours.  FINDING OUT THE RESULTS OF YOUR TEST Not all test results are available during your visit. If your test results are not back during the visit, make an appointment with your caregiver to find out the results. Do not assume everything is normal if you have not heard from your  caregiver or the medical facility. It is important for you to follow up on all of your test results.  SEEK IMMEDIATE MEDICAL ATTENTION IF: You have more than a spotting of blood in your stool.  Your belly is swollen (abdominal distention).  You are nauseated or vomiting.  You have a temperature over 101.  You have abdominal pain or discomfort that is severe or gets worse throughout the day.   Your colonoscopy was relatively unremarkable.  I did not find any polyps or evidence of colon cancer.  You do have diverticulosis and internal hemorrhoids.  Continue on fiber therapy.  Continue on stool softener.  Follow-up in GI office in 2 to 3 months.   I hope you have a great rest of your week!  Hennie Duos. Marletta Lor, D.O. Gastroenterology and Hepatology [facility]

## 2023-10-17 NOTE — Anesthesia Preprocedure Evaluation (Signed)
 Anesthesia Evaluation  Patient identified by MRN, date of birth, ID band Patient awake    Reviewed: Allergy & Precautions, H&P , NPO status , Patient's Chart, lab work & pertinent test results, reviewed documented beta blocker date and time   Airway Mallampati: II  TM Distance: >3 FB Neck ROM: full    Dental no notable dental hx.    Pulmonary neg pulmonary ROS, former smoker   Pulmonary exam normal breath sounds clear to auscultation       Cardiovascular Exercise Tolerance: Good hypertension, + CAD and + CABG  + dysrhythmias  Rhythm:regular Rate:Normal     Neuro/Psych  Neuromuscular disease negative neurological ROS  negative psych ROS   GI/Hepatic negative GI ROS, Neg liver ROS,GERD  ,,  Endo/Other  negative endocrine ROS    Renal/GU Renal diseasenegative Renal ROS  negative genitourinary   Musculoskeletal   Abdominal   Peds  Hematology negative hematology ROS (+)   Anesthesia Other Findings   Reproductive/Obstetrics negative OB ROS                             Anesthesia Physical Anesthesia Plan  ASA: 3  Anesthesia Plan: General   Post-op Pain Management:    Induction:   PONV Risk Score and Plan: Propofol infusion  Airway Management Planned:   Additional Equipment:   Intra-op Plan:   Post-operative Plan:   Informed Consent: I have reviewed the patients History and Physical, chart, labs and discussed the procedure including the risks, benefits and alternatives for the proposed anesthesia with the patient or authorized representative who has indicated his/her understanding and acceptance.     Dental Advisory Given  Plan Discussed with: CRNA  Anesthesia Plan Comments:        Anesthesia Quick Evaluation

## 2023-10-17 NOTE — Interval H&P Note (Signed)
 History and Physical Interval Note:  10/17/2023 11:50 AM  Darius Grimes  has presented today for surgery, with the diagnosis of change in bowel habits.  The various methods of treatment have been discussed with the patient and family. After consideration of risks, benefits and other options for treatment, the patient has consented to  Procedure(s) with comments: COLONOSCOPY WITH PROPOFOL (N/A) - 11:30 am, asa 3 as a surgical intervention.  The patient's history has been reviewed, patient examined, no change in status, stable for surgery.  I have reviewed the patient's chart and labs.  Questions were answered to the patient's satisfaction.     Lanelle Bal

## 2023-10-18 ENCOUNTER — Encounter (HOSPITAL_COMMUNITY): Payer: Self-pay | Admitting: Internal Medicine

## 2023-10-18 NOTE — Progress Notes (Signed)
 Letter sent.

## 2023-10-22 NOTE — Anesthesia Postprocedure Evaluation (Signed)
 Anesthesia Post Note  Patient: Darius Grimes  Procedure(s) Performed: COLONOSCOPY WITH PROPOFOL  Patient location during evaluation: Phase II Anesthesia Type: General Level of consciousness: awake Pain management: pain level controlled Vital Signs Assessment: post-procedure vital signs reviewed and stable Respiratory status: spontaneous breathing and respiratory function stable Cardiovascular status: blood pressure returned to baseline and stable Postop Assessment: no headache and no apparent nausea or vomiting Anesthetic complications: no Comments: Late entry   No notable events documented.   Last Vitals:  Vitals:   10/17/23 1246 10/17/23 1248  BP: (!) 98/55 113/67  Pulse:    Resp:    Temp:    SpO2:      Last Pain:  Vitals:   10/17/23 1248  TempSrc:   PainSc: 0-No pain                 Windell Norfolk

## 2023-11-11 ENCOUNTER — Ambulatory Visit: Payer: Medicare HMO | Admitting: Urology

## 2023-11-16 ENCOUNTER — Encounter: Payer: Self-pay | Admitting: Gastroenterology

## 2023-11-28 DIAGNOSIS — Z713 Dietary counseling and surveillance: Secondary | ICD-10-CM | POA: Diagnosis not present

## 2023-11-28 DIAGNOSIS — K5900 Constipation, unspecified: Secondary | ICD-10-CM | POA: Diagnosis not present

## 2023-11-28 DIAGNOSIS — Z7182 Exercise counseling: Secondary | ICD-10-CM | POA: Diagnosis not present

## 2023-11-28 DIAGNOSIS — R600 Localized edema: Secondary | ICD-10-CM | POA: Diagnosis not present

## 2023-11-28 DIAGNOSIS — N5089 Other specified disorders of the male genital organs: Secondary | ICD-10-CM | POA: Diagnosis not present

## 2023-11-28 DIAGNOSIS — I10 Essential (primary) hypertension: Secondary | ICD-10-CM | POA: Diagnosis not present

## 2023-11-28 DIAGNOSIS — B349 Viral infection, unspecified: Secondary | ICD-10-CM | POA: Diagnosis not present

## 2023-11-28 DIAGNOSIS — Z79899 Other long term (current) drug therapy: Secondary | ICD-10-CM | POA: Diagnosis not present

## 2023-11-28 DIAGNOSIS — R3911 Hesitancy of micturition: Secondary | ICD-10-CM | POA: Diagnosis not present

## 2023-11-28 DIAGNOSIS — K219 Gastro-esophageal reflux disease without esophagitis: Secondary | ICD-10-CM | POA: Diagnosis not present

## 2023-11-28 DIAGNOSIS — N401 Enlarged prostate with lower urinary tract symptoms: Secondary | ICD-10-CM | POA: Diagnosis not present

## 2023-12-02 ENCOUNTER — Inpatient Hospital Stay (HOSPITAL_COMMUNITY)

## 2023-12-02 ENCOUNTER — Other Ambulatory Visit: Payer: Self-pay

## 2023-12-02 ENCOUNTER — Encounter (HOSPITAL_COMMUNITY): Payer: Self-pay | Admitting: *Deleted

## 2023-12-02 ENCOUNTER — Inpatient Hospital Stay (HOSPITAL_COMMUNITY)
Admission: EM | Admit: 2023-12-02 | Discharge: 2023-12-05 | DRG: 872 | Disposition: A | Discharge status: Home / Self Care | Attending: Internal Medicine | Admitting: Internal Medicine

## 2023-12-02 ENCOUNTER — Emergency Department (HOSPITAL_COMMUNITY)

## 2023-12-02 DIAGNOSIS — E785 Hyperlipidemia, unspecified: Secondary | ICD-10-CM | POA: Diagnosis present

## 2023-12-02 DIAGNOSIS — Z7982 Long term (current) use of aspirin: Secondary | ICD-10-CM

## 2023-12-02 DIAGNOSIS — E66811 Obesity, class 1: Secondary | ICD-10-CM | POA: Diagnosis present

## 2023-12-02 DIAGNOSIS — N3001 Acute cystitis with hematuria: Principal | ICD-10-CM | POA: Diagnosis present

## 2023-12-02 DIAGNOSIS — K219 Gastro-esophageal reflux disease without esophagitis: Secondary | ICD-10-CM | POA: Diagnosis present

## 2023-12-02 DIAGNOSIS — Z8673 Personal history of transient ischemic attack (TIA), and cerebral infarction without residual deficits: Secondary | ICD-10-CM

## 2023-12-02 DIAGNOSIS — R531 Weakness: Secondary | ICD-10-CM | POA: Diagnosis present

## 2023-12-02 DIAGNOSIS — Z888 Allergy status to other drugs, medicaments and biological substances status: Secondary | ICD-10-CM | POA: Diagnosis not present

## 2023-12-02 DIAGNOSIS — N400 Enlarged prostate without lower urinary tract symptoms: Secondary | ICD-10-CM | POA: Diagnosis present

## 2023-12-02 DIAGNOSIS — Z96652 Presence of left artificial knee joint: Secondary | ICD-10-CM | POA: Diagnosis present

## 2023-12-02 DIAGNOSIS — A419 Sepsis, unspecified organism: Secondary | ICD-10-CM | POA: Diagnosis present

## 2023-12-02 DIAGNOSIS — Z791 Long term (current) use of non-steroidal anti-inflammatories (NSAID): Secondary | ICD-10-CM

## 2023-12-02 DIAGNOSIS — Z6833 Body mass index (BMI) 33.0-33.9, adult: Secondary | ICD-10-CM

## 2023-12-02 DIAGNOSIS — R6520 Severe sepsis without septic shock: Secondary | ICD-10-CM | POA: Diagnosis present

## 2023-12-02 DIAGNOSIS — N401 Enlarged prostate with lower urinary tract symptoms: Secondary | ICD-10-CM | POA: Diagnosis not present

## 2023-12-02 DIAGNOSIS — Z8249 Family history of ischemic heart disease and other diseases of the circulatory system: Secondary | ICD-10-CM | POA: Diagnosis not present

## 2023-12-02 DIAGNOSIS — Z87442 Personal history of urinary calculi: Secondary | ICD-10-CM | POA: Diagnosis not present

## 2023-12-02 DIAGNOSIS — Z87891 Personal history of nicotine dependence: Secondary | ICD-10-CM

## 2023-12-02 DIAGNOSIS — K5730 Diverticulosis of large intestine without perforation or abscess without bleeding: Secondary | ICD-10-CM | POA: Diagnosis not present

## 2023-12-02 DIAGNOSIS — N200 Calculus of kidney: Secondary | ICD-10-CM | POA: Diagnosis present

## 2023-12-02 DIAGNOSIS — Z951 Presence of aortocoronary bypass graft: Secondary | ICD-10-CM

## 2023-12-02 DIAGNOSIS — N179 Acute kidney failure, unspecified: Secondary | ICD-10-CM | POA: Diagnosis not present

## 2023-12-02 DIAGNOSIS — Z823 Family history of stroke: Secondary | ICD-10-CM

## 2023-12-02 DIAGNOSIS — R519 Headache, unspecified: Secondary | ICD-10-CM | POA: Diagnosis not present

## 2023-12-02 DIAGNOSIS — R509 Fever, unspecified: Secondary | ICD-10-CM | POA: Diagnosis not present

## 2023-12-02 DIAGNOSIS — I480 Paroxysmal atrial fibrillation: Secondary | ICD-10-CM | POA: Diagnosis not present

## 2023-12-02 DIAGNOSIS — Z974 Presence of external hearing-aid: Secondary | ICD-10-CM

## 2023-12-02 DIAGNOSIS — A4151 Sepsis due to Escherichia coli [E. coli]: Secondary | ICD-10-CM | POA: Diagnosis not present

## 2023-12-02 DIAGNOSIS — Z1152 Encounter for screening for COVID-19: Secondary | ICD-10-CM

## 2023-12-02 DIAGNOSIS — I2510 Atherosclerotic heart disease of native coronary artery without angina pectoris: Secondary | ICD-10-CM | POA: Diagnosis present

## 2023-12-02 DIAGNOSIS — Z79899 Other long term (current) drug therapy: Secondary | ICD-10-CM

## 2023-12-02 DIAGNOSIS — I10 Essential (primary) hypertension: Secondary | ICD-10-CM | POA: Diagnosis present

## 2023-12-02 DIAGNOSIS — R42 Dizziness and giddiness: Secondary | ICD-10-CM | POA: Diagnosis not present

## 2023-12-02 DIAGNOSIS — N2889 Other specified disorders of kidney and ureter: Secondary | ICD-10-CM

## 2023-12-02 DIAGNOSIS — K4020 Bilateral inguinal hernia, without obstruction or gangrene, not specified as recurrent: Secondary | ICD-10-CM | POA: Diagnosis not present

## 2023-12-02 LAB — URINALYSIS, ROUTINE W REFLEX MICROSCOPIC
Bilirubin Urine: NEGATIVE
Glucose, UA: NEGATIVE mg/dL
Ketones, ur: NEGATIVE mg/dL
Nitrite: POSITIVE — AB
Protein, ur: 30 mg/dL — AB
Specific Gravity, Urine: 1.014 (ref 1.005–1.030)
WBC, UA: >50 WBC/hpf (ref 0–5)
pH: 5 (ref 5.0–8.0)

## 2023-12-02 LAB — CBC WITH DIFFERENTIAL/PLATELET
Abs Immature Granulocytes: 0.11 10*3/uL — ABNORMAL HIGH (ref 0.00–0.07)
Basophils Absolute: 0 10*3/uL (ref 0.0–0.1)
Basophils Relative: 0 %
Eosinophils Absolute: 0 10*3/uL (ref 0.0–0.5)
Eosinophils Relative: 0 %
HCT: 43.7 % (ref 39.0–52.0)
Hemoglobin: 14.3 g/dL (ref 13.0–17.0)
Immature Granulocytes: 1 %
Lymphocytes Relative: 2 %
Lymphs Abs: 0.3 10*3/uL — ABNORMAL LOW (ref 0.7–4.0)
MCH: 29.9 pg (ref 26.0–34.0)
MCHC: 32.7 g/dL (ref 30.0–36.0)
MCV: 91.4 fL (ref 80.0–100.0)
Monocytes Absolute: 0.4 10*3/uL (ref 0.1–1.0)
Monocytes Relative: 3 %
Neutro Abs: 15.9 10*3/uL — ABNORMAL HIGH (ref 1.7–7.7)
Neutrophils Relative %: 94 %
Platelets: 325 10*3/uL (ref 150–400)
RBC: 4.78 MIL/uL (ref 4.22–5.81)
RDW: 14.5 % (ref 11.5–15.5)
WBC: 16.8 10*3/uL — ABNORMAL HIGH (ref 4.0–10.5)
nRBC: 0 % (ref 0.0–0.2)

## 2023-12-02 LAB — COMPREHENSIVE METABOLIC PANEL WITH GFR
ALT: 17 U/L (ref 0–44)
AST: 28 U/L (ref 15–41)
Albumin: 3.1 g/dL — ABNORMAL LOW (ref 3.5–5.0)
Alkaline Phosphatase: 62 U/L (ref 38–126)
Anion gap: 15 (ref 5–15)
BUN: 14 mg/dL (ref 8–23)
CO2: 20 mmol/L — ABNORMAL LOW (ref 22–32)
Calcium: 9.1 mg/dL (ref 8.9–10.3)
Chloride: 99 mmol/L (ref 98–111)
Creatinine, Ser: 1.26 mg/dL — ABNORMAL HIGH (ref 0.61–1.24)
GFR, Estimated: 59 mL/min — ABNORMAL LOW (ref >60)
Glucose, Bld: 176 mg/dL — ABNORMAL HIGH (ref 70–99)
Potassium: 3.6 mmol/L (ref 3.5–5.1)
Sodium: 134 mmol/L — ABNORMAL LOW (ref 135–145)
Total Bilirubin: 0.7 mg/dL (ref 0.0–1.2)
Total Protein: 7.1 g/dL (ref 6.5–8.1)

## 2023-12-02 LAB — URINALYSIS, W/ REFLEX TO CULTURE (INFECTION SUSPECTED)
Bilirubin Urine: NEGATIVE
Glucose, UA: NEGATIVE mg/dL
Ketones, ur: NEGATIVE mg/dL
Nitrite: NEGATIVE
Protein, ur: 30 mg/dL — AB
RBC / HPF: >50 RBC/hpf (ref 0–5)
Specific Gravity, Urine: 1.025 (ref 1.005–1.030)
WBC, UA: >50 WBC/hpf (ref 0–5)
pH: 6 (ref 5.0–8.0)

## 2023-12-02 LAB — LACTIC ACID, PLASMA
Lactic Acid, Venous: 3.6 mmol/L (ref 0.5–1.9)
Lactic Acid, Venous: 1.3 mmol/L (ref 0.5–1.9)

## 2023-12-02 LAB — RESP PANEL BY RT-PCR (RSV, FLU A&B, COVID)  RVPGX2
Influenza A by PCR: NEGATIVE
Influenza B by PCR: NEGATIVE
Resp Syncytial Virus by PCR: NEGATIVE
SARS Coronavirus 2 by RT PCR: NEGATIVE

## 2023-12-02 MED ORDER — FINASTERIDE 5 MG PO TABS
5.0000 mg | ORAL_TABLET | Freq: Every day | ORAL | Status: DC
  Administered 2023-12-02 – 2023-12-05 (×4): 5 mg via ORAL
  Filled 2023-12-02 (×4): qty 1

## 2023-12-02 MED ORDER — ONDANSETRON HCL 4 MG PO TABS: 4.0000 mg | ORAL_TABLET | Freq: Four times a day (QID) | ORAL | Status: DC | PRN

## 2023-12-02 MED ORDER — SODIUM CHLORIDE 0.9 % IV SOLN: INTRAVENOUS | Status: AC

## 2023-12-02 MED ORDER — IOHEXOL 300 MG/ML  SOLN
100.0000 mL | Freq: Once | INTRAMUSCULAR | Status: AC | PRN
  Administered 2023-12-02: 100 mL via INTRAVENOUS

## 2023-12-02 MED ORDER — IBUPROFEN 400 MG PO TABS
400.0000 mg | ORAL_TABLET | Freq: Once | ORAL | Status: AC
  Administered 2023-12-02: 400 mg via ORAL
  Filled 2023-12-02: qty 1

## 2023-12-02 MED ORDER — SODIUM CHLORIDE 0.9 % IV SOLN
2.0000 g | INTRAVENOUS | Status: DC
  Administered 2023-12-03 – 2023-12-05 (×3): 2 g via INTRAVENOUS
  Filled 2023-12-02 (×3): qty 20

## 2023-12-02 MED ORDER — ASPIRIN 81 MG PO TBEC
81.0000 mg | DELAYED_RELEASE_TABLET | Freq: Every day | ORAL | Status: DC
  Administered 2023-12-02 – 2023-12-05 (×4): 81 mg via ORAL
  Filled 2023-12-02 (×4): qty 1

## 2023-12-02 MED ORDER — ONDANSETRON HCL 4 MG/2ML IJ SOLN: 4.0000 mg | Freq: Four times a day (QID) | INTRAMUSCULAR | Status: DC | PRN

## 2023-12-02 MED ORDER — PROPRANOLOL HCL 20 MG PO TABS
10.0000 mg | ORAL_TABLET | Freq: Two times a day (BID) | ORAL | Status: DC
  Administered 2023-12-02 – 2023-12-04 (×5): 10 mg via ORAL
  Filled 2023-12-02 (×6): qty 1

## 2023-12-02 MED ORDER — VITAMIN B-12 1000 MCG PO TABS
2000.0000 ug | ORAL_TABLET | Freq: Every day | ORAL | Status: DC
  Administered 2023-12-02 – 2023-12-05 (×4): 2000 ug via ORAL
  Filled 2023-12-02 (×4): qty 2

## 2023-12-02 MED ORDER — SODIUM CHLORIDE 0.9 % IV BOLUS
1000.0000 mL | Freq: Once | INTRAVENOUS | Status: AC
  Administered 2023-12-02: 1000 mL via INTRAVENOUS

## 2023-12-02 MED ORDER — VITAMIN C 500 MG PO TABS
1000.0000 mg | ORAL_TABLET | Freq: Every day | ORAL | Status: DC
  Administered 2023-12-02 – 2023-12-05 (×4): 1000 mg via ORAL
  Filled 2023-12-02 (×4): qty 2

## 2023-12-02 MED ORDER — HEPARIN SODIUM (PORCINE) 5000 UNIT/ML IJ SOLN
5000.0000 [IU] | Freq: Three times a day (TID) | INTRAMUSCULAR | Status: DC
  Administered 2023-12-02 – 2023-12-05 (×8): 5000 [IU] via SUBCUTANEOUS
  Filled 2023-12-02 (×8): qty 1

## 2023-12-02 MED ORDER — PROPRANOLOL HCL 20 MG PO TABS
20.0000 mg | ORAL_TABLET | Freq: Every day | ORAL | Status: DC
  Administered 2023-12-03 – 2023-12-05 (×3): 20 mg via ORAL
  Filled 2023-12-02 (×3): qty 1

## 2023-12-02 MED ORDER — TAMSULOSIN HCL 0.4 MG PO CAPS
0.4000 mg | ORAL_CAPSULE | Freq: Every day | ORAL | Status: DC
  Administered 2023-12-02 – 2023-12-05 (×4): 0.4 mg via ORAL
  Filled 2023-12-02 (×4): qty 1

## 2023-12-02 MED ORDER — ACETAMINOPHEN 325 MG PO TABS
650.0000 mg | ORAL_TABLET | Freq: Four times a day (QID) | ORAL | Status: DC | PRN
  Administered 2023-12-02 – 2023-12-03 (×3): 650 mg via ORAL
  Filled 2023-12-02 (×3): qty 2

## 2023-12-02 MED ORDER — ATORVASTATIN CALCIUM 10 MG PO TABS
10.0000 mg | ORAL_TABLET | ORAL | Status: DC
  Administered 2023-12-02 – 2023-12-05 (×2): 10 mg via ORAL
  Filled 2023-12-02 (×2): qty 1

## 2023-12-02 MED ORDER — PANTOPRAZOLE SODIUM 40 MG PO TBEC
40.0000 mg | DELAYED_RELEASE_TABLET | Freq: Every day | ORAL | Status: DC
  Administered 2023-12-02 – 2023-12-05 (×4): 40 mg via ORAL
  Filled 2023-12-02 (×4): qty 1

## 2023-12-02 MED ORDER — SODIUM CHLORIDE 0.9 % IV SOLN
2.0000 g | Freq: Once | INTRAVENOUS | Status: AC
  Administered 2023-12-02: 2 g via INTRAVENOUS
  Filled 2023-12-02: qty 20

## 2023-12-02 MED ORDER — LORAZEPAM 0.5 MG PO TABS
0.5000 mg | ORAL_TABLET | Freq: Every evening | ORAL | Status: DC | PRN
  Administered 2023-12-02 – 2023-12-04 (×3): 0.5 mg via ORAL
  Filled 2023-12-02 (×3): qty 1

## 2023-12-02 MED ORDER — AMLODIPINE BESYLATE 5 MG PO TABS
10.0000 mg | ORAL_TABLET | Freq: Every day | ORAL | Status: DC
  Administered 2023-12-02 – 2023-12-04 (×3): 10 mg via ORAL
  Filled 2023-12-02 (×3): qty 2

## 2023-12-02 MED ORDER — ACETAMINOPHEN 650 MG RE SUPP: 650.0000 mg | Freq: Four times a day (QID) | RECTAL | Status: DC | PRN

## 2023-12-02 MED ORDER — ACETAMINOPHEN 325 MG PO TABS
650.0000 mg | ORAL_TABLET | Freq: Once | ORAL | Status: AC
  Administered 2023-12-02: 650 mg via ORAL
  Filled 2023-12-02: qty 2

## 2023-12-02 NOTE — Progress Notes (Signed)
   12/02/23 2035  Provider Notification  Provider Name/Title Dr. Thomes Dinning  Date Provider Notified 12/02/23  Time Provider Notified 2035  Notification Reason Critical Result  Test performed and critical result blood cultures gram negative rods from anaerobic bottle  Date Critical Result Received 12/02/23  Time Critical Result Received 2033

## 2023-12-02 NOTE — ED Triage Notes (Signed)
 Pt c/o dizziness and vomiting that started 1 week ago. Pt reports negative Covid test last Thursday.

## 2023-12-02 NOTE — ED Notes (Signed)
 ED TO INPATIENT HANDOFF REPORT  ED Nurse Name and Phone #: [phone]  S Name/Age/Gender Darius Grimes 78 y.o. male Room/Bed: APA04/APA04  Code Status   Code Status: Prior  Home/SNF/Other Home Patient oriented to: self, place, time, and situation Is this baseline? Yes   Triage Complete: Triage complete  Chief Complaint Sepsis [facility]) [A41.9]  Triage Note Pt c/o dizziness and vomiting that started 1 week ago. Pt reports negative Covid test last Thursday.   Allergies Allergies  Allergen Reactions   Alfuzosin Other (See Comments)    Pt is unaware of allergy    Pravastatin Other (See Comments)    Joint pain    Terazosin Other (See Comments)    Pt is unaware of allergy    Level of Care/Admitting Diagnosis ED Disposition     ED Disposition  Admit   Condition  --   Comment  Hospital Area: [facility] [[id number]]  Level of Care: Telemetry [5]  Covid Evaluation: Asymptomatic - no recent exposure (last 10 days) testing not required  Diagnosis: Sepsis [facility]) [[id number]]  Admitting Physician: Erick Blinks [[id number]]  Attending Physician: Erick Blinks [[id number]]  Certification:: I certify this patient will need inpatient services for at least 2 midnights  Expected Medical Readiness: 12/05/2023          B Medical/Surgery History Past Medical History:  Diagnosis Date   Acid reflux    Allergic rhinitis    Arthritis    Coronary artery disease    Dysrhythmia    A Fib   Episodic atrial fibrillation (HCC)    GERD (gastroesophageal reflux disease)    Heart palpitations    History of kidney stones    Hyperlipidemia    Hypertension    Kidney stones    Neck pain    Recurrent nephrolithiasis    Skin lesion of back    Past Surgical History:  Procedure Laterality Date   CARPAL TUNNEL RELEASE Left    CATARACT EXTRACTION W/PHACO Left 02/01/2020   Procedure: CATARACT EXTRACTION PHACO AND INTRAOCULAR LENS PLACEMENT (IOC);  Surgeon: Fabio Pierce, MD;  Location: [facility]  ORS;  Service: Ophthalmology;  Laterality: Left;  CDE: 8.89   CATARACT EXTRACTION W/PHACO Left 02/15/2020   Procedure: REMOVAL OF RETAINED LENS FRAGMENTS LEFT EYE;  Surgeon: Fabio Pierce, MD;  Location: [facility] ORS;  Service: Ophthalmology;  Laterality: Left;   CATARACT EXTRACTION W/PHACO Right 05/26/2020   Procedure: CATARACT EXTRACTION PHACO AND INTRAOCULAR LENS PLACEMENT RIGHT EYE;  Surgeon: Fabio Pierce, MD;  Location: [facility] ORS;  Service: Ophthalmology;  Laterality: Right;  CDE: 6.38   COLONOSCOPY  2007   Tubular adenomas   COLONOSCOPY  2012   Tubular adenomas   COLONOSCOPY  2017   Normal ileum, diverticulosis, normal rectum, no polyps.   COLONOSCOPY WITH PROPOFOL N/A 01/08/2022   Procedure: COLONOSCOPY WITH PROPOFOL;  Surgeon: Lanelle Bal, DO;  Location: [facility] END[facility];  Service: Endoscopy;  Laterality: N/A;  8:00am   COLONOSCOPY WITH PROPOFOL N/A 10/17/2023   Procedure: COLONOSCOPY WITH PROPOFOL;  Surgeon: Lanelle Bal, DO;  Location: [facility];  Service: Endoscopy;  Laterality: N/A;  11:30 am, asa 3   CORONARY ARTERY BYPASS GRAFT  2016   HYDROCELE EXCISION / REPAIR     KNEE ARTHROPLASTY Left 07/04/2017   Procedure: COMPUTER ASSISTED TOTAL KNEE ARTHROPLASTY;  Surgeon: Donato Heinz, MD;  Location: [facility] ORS;  Service: Orthopedics;  Laterality: Left;   POLYPECTOMY  01/08/2022   Procedure: POLYPECTOMY;  Surgeon: Earnest Bailey  K, DO;  Location: [facility];  Service: Endoscopy;;   THROAT SURGERY     URETERAL STENT PLACEMENT     at the [facility]   Vocal cord poylp removal       A IV Location/Drains/Wounds Patient Lines/Drains/Airways Status     Active Line/Drains/Airways     Name Placement date Placement time Site Days   Peripheral IV 12/02/23 20 G 1" Right Antecubital 12/02/23  0802  Antecubital  less than 1            Intake/Output Last 24 hours  Intake/Output Summary (Last 24 hours) at 12/02/2023 1118 Last data filed at 12/02/2023 1059 Gross per 24 hour  Intake  1000 ml  Output --  Net 1000 ml    Labs/Imaging Results for orders placed or performed during the hospital encounter of 12/02/23 (from the past 48 hours)  CBC with Differential     Status: Abnormal   Collection Time: 12/02/23  7:49 AM  Result Value Ref Range   WBC 16.8 (H) 4.0 - 10.5 K/uL   RBC 4.78 4.22 - 5.81 MIL/uL   Hemoglobin 14.3 13.0 - 17.0 g/dL   HCT 40.9 81.1 - 91.4 %   MCV 91.4 80.0 - 100.0 fL   MCH 29.9 26.0 - 34.0 pg   MCHC 32.7 30.0 - 36.0 g/dL   RDW 78.2 95.6 - 21.3 %   Platelets 325 150 - 400 K/uL   nRBC 0.0 0.0 - 0.2 %   Neutrophils Relative % 94 %   Neutro Abs 15.9 (H) 1.7 - 7.7 K/uL   Lymphocytes Relative 2 %   Lymphs Abs 0.3 (L) 0.7 - 4.0 K/uL   Monocytes Relative 3 %   Monocytes Absolute 0.4 0.1 - 1.0 K/uL   Eosinophils Relative 0 %   Eosinophils Absolute 0.0 0.0 - 0.5 K/uL   Basophils Relative 0 %   Basophils Absolute 0.0 0.0 - 0.1 K/uL   Immature Granulocytes 1 %   Abs Immature Granulocytes 0.11 (H) 0.00 - 0.07 K/uL    Comment: Performed at [facility], [address] [place], [place] [address]  Comprehensive metabolic panel     Status: Abnormal   Collection Time: 12/02/23  7:49 AM  Result Value Ref Range   Sodium 134 (L) 135 - 145 mmol/L   Potassium 3.6 3.5 - 5.1 mmol/L   Chloride 99 98 - 111 mmol/L   CO2 20 (L) 22 - 32 mmol/L   Glucose, Bld 176 (H) 70 - 99 mg/dL    Comment: Glucose reference range applies only to samples taken after fasting for at least 8 hours.   BUN 14 8 - 23 mg/dL   Creatinine, Ser 8.46 (H) 0.61 - 1.24 mg/dL   Calcium 9.1 8.9 - 96.2 mg/dL   Total Protein 7.1 6.5 - 8.1 g/dL   Albumin 3.1 (L) 3.5 - 5.0 g/dL   AST 28 15 - 41 U/L   ALT 17 0 - 44 U/L   Alkaline Phosphatase 62 38 - 126 U/L   Total Bilirubin 0.7 0.0 - 1.2 mg/dL   GFR, Estimated 59 (L) >60 mL/min    Comment: (NOTE) Calculated using the CKD-EPI Creatinine Equation (2021)    Anion gap 15 5 - 15    Comment: Performed at [facility], [address]  [place], [place] [address]  Resp panel by RT-PCR (RSV, Flu A&B, Covid) Anterior Nasal Swab     Status: None   Collection Time: 12/02/23  8:14 AM  Specimen: Anterior Nasal Swab  Result Value Ref Range   SARS Coronavirus 2 by RT PCR NEGATIVE NEGATIVE    Comment: (NOTE) SARS-CoV-2 target nucleic acids are NOT DETECTED.  The SARS-CoV-2 RNA is generally detectable in upper respiratory specimens during the acute phase of infection. The lowest concentration of SARS-CoV-2 viral copies this assay can detect is 138 copies/mL. A negative result does not preclude SARS-Cov-2 infection and should not be used as the sole basis for treatment or other patient management decisions. A negative result may occur with  improper specimen collection/handling, submission of specimen other than nasopharyngeal swab, presence of viral mutation(s) within the areas targeted by this assay, and inadequate number of viral copies(<138 copies/mL). A negative result must be combined with clinical observations, patient history, and epidemiological information. The expected result is Negative.  Fact Sheet for Patients:  BloggerCourse.com  Fact Sheet for Healthcare Providers:  SeriousBroker.it  This test is no t yet approved or cleared by the [place] FDA and  has been authorized for detection and/or diagnosis of SARS-CoV-2 by FDA under an Emergency Use Authorization (EUA). This EUA will remain  in effect (meaning this test can be used) for the duration of the COVID-19 declaration under Section 564(b)(1) of the Act, 21 U.S.C.section 360bbb-3(b)(1), unless the authorization is terminated  or revoked sooner.       Influenza A by PCR NEGATIVE NEGATIVE   Influenza B by PCR NEGATIVE NEGATIVE    Comment: (NOTE) The Xpert Xpress SARS-CoV-2/FLU/RSV plus assay is intended as an aid in the diagnosis of influenza from Nasopharyngeal swab specimens and should not be used as a  sole basis for treatment. Nasal washings and aspirates are unacceptable for Xpert Xpress SARS-CoV-2/FLU/RSV testing.  Fact Sheet for Patients: BloggerCourse.com  Fact Sheet for Healthcare Providers: SeriousBroker.it  This test is not yet approved or cleared by the [place] FDA and has been authorized for detection and/or diagnosis of SARS-CoV-2 by FDA under an Emergency Use Authorization (EUA). This EUA will remain in effect (meaning this test can be used) for the duration of the COVID-19 declaration under Section 564(b)(1) of the Act, 21 U.S.C. section 360bbb-3(b)(1), unless the authorization is terminated or revoked.     Resp Syncytial Virus by PCR NEGATIVE NEGATIVE    Comment: (NOTE) Fact Sheet for Patients: BloggerCourse.com  Fact Sheet for Healthcare Providers: SeriousBroker.it  This test is not yet approved or cleared by the [place] FDA and has been authorized for detection and/or diagnosis of SARS-CoV-2 by FDA under an Emergency Use Authorization (EUA). This EUA will remain in effect (meaning this test can be used) for the duration of the COVID-19 declaration under Section 564(b)(1) of the Act, 21 U.S.C. section 360bbb-3(b)(1), unless the authorization is terminated or revoked.  Performed at [facility], [address] [place], [place] [address]   Lactic acid, plasma     Status: Abnormal   Collection Time: 12/02/23  9:09 AM  Result Value Ref Range   Lactic Acid, Venous 3.6 (HH) 0.5 - 1.9 mmol/L    Comment: CRITICAL RESULT CALLED TO, READ BACK BY AND VERIFIED WITH SUSAN K @ 1003 ON [id number] BY HENDERSON L Performed at [facility], [address], [place], [place] [address]   Urinalysis, Routine w reflex microscopic -Urine, Clean Catch     Status: Abnormal   Collection Time: 12/02/23  9:44 AM  Result Value Ref Range   Color, Urine YELLOW YELLOW   APPearance  HAZY (A) CLEAR   Specific Gravity, Urine  1.014 1.005 - 1.030   pH 5.0 5.0 - 8.0   Glucose, UA NEGATIVE NEGATIVE mg/dL   Hgb urine dipstick SMALL (A) NEGATIVE   Bilirubin Urine NEGATIVE NEGATIVE   Ketones, ur NEGATIVE NEGATIVE mg/dL   Protein, ur 30 (A) NEGATIVE mg/dL   Nitrite POSITIVE (A) NEGATIVE   Leukocytes,Ua LARGE (A) NEGATIVE   RBC / HPF 0-5 0 - 5 RBC/hpf   WBC, UA >50 0 - 5 WBC/hpf   Bacteria, UA FEW (A) NONE SEEN   Squamous Epithelial / HPF 0-5 0 - 5 /HPF   WBC Clumps PRESENT    Mucus PRESENT     Comment: Performed at [facility], [address], [place], [place] [address]   DG Sinuses Complete Result Date: 12/02/2023 CLINICAL DATA:  Headache, dizziness, fever. EXAM: PARANASAL SINUSES - COMPLETE 3 + VIEW COMPARISON:  None Available. FINDINGS: The paranasal sinus are aerated. There is no evidence of sinus opacification air-fluid levels or mucosal thickening. No significant bone abnormalities are seen. IMPRESSION: Negative. Electronically Signed   By: Lupita Raider M.D.   On: 12/02/2023 08:55   DG Chest 2 View Result Date: 12/02/2023 CLINICAL DATA:  Fever. EXAM: CHEST - 2 VIEW COMPARISON:  10/15/2016 FINDINGS: The cardio pericardial silhouette is enlarged. The lungs are clear without focal pneumonia, edema, pneumothorax or pleural effusion. No acute bony abnormality. Telemetry leads overlie the chest. Status post CABG. IMPRESSION: Enlargement of the cardiopericardial silhouette without acute cardiopulmonary findings. Electronically Signed   By: Kennith Center M.D.   On: 12/02/2023 08:53    Pending Labs Unresulted Labs (From admission, onward)     Start     Ordered   12/02/23 1054  Urine Culture  Once,   URGENT       Question:  Indication  Answer:  Dysuria   12/02/23 1053   12/02/23 0845  Lyme Disease Serology w/Reflex  Once,   R        12/02/23 0845   12/02/23 0802  Blood culture (routine x 2)  BLOOD CULTURE X 2,   R (with STAT occurrences)      12/02/23 0804   12/02/23 0802   Lactic acid, plasma  (Lactic Acid)  Now then every 2 hours,   R (with STAT occurrences)      12/02/23 0804            Vitals/Pain Today's Vitals   12/02/23 0809 12/02/23 0815 12/02/23 0930 12/02/23 0945  BP:  124/77  118/60  Pulse:  (!) 102  88  Resp:  20  (!) 25  Temp:   98.8 F (37.1 C)   TempSrc:   Oral   SpO2:  95%  96%  Weight:      Height:      PainSc: 0-No pain       Isolation Precautions No active isolations  Medications Medications  acetaminophen (TYLENOL) tablet 650 mg (650 mg Oral Given 12/02/23 0809)  sodium chloride 0.9 % bolus 1,000 mL (0 mLs Intravenous Stopped 12/02/23 1059)  cefTRIAXone (ROCEPHIN) 2 g in sodium chloride 0.9 % 100 mL IVPB (2 g Intravenous New Bag/Given 12/02/23 1017)    Mobility walks     Focused Assessments     R Recommendations: See Admitting Provider Note  Report given to:   Additional Notes:

## 2023-12-02 NOTE — H&P (Signed)
 History and Physical    Darius Grimes YNW:[id number] DOB: 1946/04/13 DOA: 12/02/2023  PCP: Benita Stabile, MD   Patient coming from: Home  Chief Complaint: Fever/nausea/vomiting/dizziness  HPI: Darius Grimes is a 78 y.o. male with medical history significant for CAD with prior CABG, atrial fibrillation, hypertension, dyslipidemia, GERD, and BPH who presented to the ED with some fevers, nausea, vomiting, dizziness for over the last 2 weeks.  He had COVID testing done which was negative.  He denies any significant abdominal pain, chest pain, or shortness of breath.  He has been having some increased urinary frequency, but denies any hematuria or discharge.  He continues to take his home medications as previously prescribed.  He currently refuses anticoagulation for his atrial fibrillation stating that he has had a previous CVA with head injury and that being on anticoagulation at that point would have led to his death.   ED Course: Vital signs stable with telemetry showing atrial fibrillation, temperature 100.4 F now improved.  Leukocytosis 16,800 noted and sodium 134 with creatinine 1.26 and lactic acid of 3.6.  Chest x-ray with no acute findings and sinus studies negative.  Flu and COVID testing negative.  UA positive for UTI and patient given 1 L fluid bolus as well as Rocephin.  Review of Systems: Reviewed as noted above, otherwise negative.  Past Medical History:  Diagnosis Date   Acid reflux    Allergic rhinitis    Arthritis    Coronary artery disease    Dysrhythmia    A Fib   Episodic atrial fibrillation (HCC)    GERD (gastroesophageal reflux disease)    Heart palpitations    History of kidney stones    Hyperlipidemia    Hypertension    Kidney stones    Neck pain    Recurrent nephrolithiasis    Skin lesion of back     Past Surgical History:  Procedure Laterality Date   CARPAL TUNNEL RELEASE Left    CATARACT EXTRACTION W/PHACO Left 02/01/2020   Procedure: CATARACT  EXTRACTION PHACO AND INTRAOCULAR LENS PLACEMENT (IOC);  Surgeon: Fabio Pierce, MD;  Location: [facility];  Service: Ophthalmology;  Laterality: Left;  CDE: 8.89   CATARACT EXTRACTION W/PHACO Left 02/15/2020   Procedure: REMOVAL OF RETAINED LENS FRAGMENTS LEFT EYE;  Surgeon: Fabio Pierce, MD;  Location: [facility];  Service: Ophthalmology;  Laterality: Left;   CATARACT EXTRACTION W/PHACO Right 05/26/2020   Procedure: CATARACT EXTRACTION PHACO AND INTRAOCULAR LENS PLACEMENT RIGHT EYE;  Surgeon: Fabio Pierce, MD;  Location: [facility];  Service: Ophthalmology;  Laterality: Right;  CDE: 6.38   COLONOSCOPY  2007   Tubular adenomas   COLONOSCOPY  2012   Tubular adenomas   COLONOSCOPY  2017   Normal ileum, diverticulosis, normal rectum, no polyps.   COLONOSCOPY WITH PROPOFOL N/A 01/08/2022   Procedure: COLONOSCOPY WITH PROPOFOL;  Surgeon: Lanelle Bal, DO;  Location: [facility];  Service: Endoscopy;  Laterality: N/A;  8:00am   COLONOSCOPY WITH PROPOFOL N/A 10/17/2023   Procedure: COLONOSCOPY WITH PROPOFOL;  Surgeon: Lanelle Bal, DO;  Location: [facility];  Service: Endoscopy;  Laterality: N/A;  11:30 am, asa 3   CORONARY ARTERY BYPASS GRAFT  2016   HYDROCELE EXCISION / REPAIR     KNEE ARTHROPLASTY Left 07/04/2017   Procedure: COMPUTER ASSISTED TOTAL KNEE ARTHROPLASTY;  Surgeon: Donato Heinz, MD;  Location: [facility];  Service: Orthopedics;  Laterality: Left;   POLYPECTOMY  01/08/2022   Procedure: POLYPECTOMY;  Surgeon:  Lanelle Bal, DO;  Location: [facility];  Service: Endoscopy;;   THROAT SURGERY     URETERAL STENT PLACEMENT     at the [facility]   Vocal cord poylp removal       reports that he quit smoking about 40 years ago. His smoking use included cigarettes. He started smoking about 47 years ago. He has a 7 pack-year smoking history. He has never used smokeless tobacco. He reports that he does not drink alcohol and does not use drugs.  Allergies  Allergen Reactions   Alfuzosin  Other (See Comments)    Pt is unaware of allergy    Pravastatin Other (See Comments)    Joint pain    Terazosin Other (See Comments)    Pt is unaware of allergy    Family History  Problem Relation Age of Onset   CVA Mother    Hypotension Mother    CAD Father    Heart attack Father    Hypertension Sister    Hypertension Brother    Obesity Brother    Colon cancer Neg Hx     Prior to Admission medications   Medication Sig Start Date End Date Taking? Authorizing Provider  acetaminophen (TYLENOL) 650 MG CR tablet Take 1,300 mg by mouth every 8 (eight) hours as needed for pain.   Yes [provider]  allopurinol (ZYLOPRIM) 300 MG tablet Take 1 tablet (300 mg total) by mouth daily. Patient taking differently: Take 300 mg by mouth daily as needed (Gout flares). 11/25/20  Yes Darreld Mclean, MD  amLODipine (NORVASC) 10 MG tablet Take 10 mg by mouth at bedtime.   Yes [provider]  Ascorbic Acid (VITAMIN C) 1000 MG tablet Take 1,000 mg by mouth daily.   Yes [provider]  aspirin EC 81 MG tablet Take 81 mg by mouth daily. Swallow whole.   Yes [provider]  atorvastatin (LIPITOR) 10 MG tablet Take 10 mg by mouth every Monday, Wednesday, and Friday.   Yes [provider]  Cholecalciferol (D3 2000 PO) Take 2,000 Units by mouth daily.   Yes [provider]  finasteride (PROSCAR) 5 MG tablet Take 5 mg by mouth daily.   Yes [provider]  LORazepam (ATIVAN) 0.5 MG tablet Take 0.5 mg by mouth at bedtime as needed for sleep.   Yes [provider]  losartan (COZAAR) 100 MG tablet Take 100 mg by mouth daily. Take 1 daily   Yes [provider]  meloxicam (MOBIC) 15 MG tablet Take 15 mg by mouth daily. 01/07/20  Yes [provider]  Multiple Vitamin (MULTIVITAMIN WITH MINERALS) TABS tablet Take 1 tablet by mouth daily.   Yes [provider]  Omega-3 Fatty Acids (FISH OIL) 1200 MG CAPS Take 2  capsules (2,400 mg total) by mouth in the morning and at bedtime. 02/01/20  Yes Fabio Pierce, MD  pantoprazole (PROTONIX) 40 MG tablet Take 40 mg by mouth daily.   Yes [provider]  potassium chloride SA (KLOR-CON M) 20 MEQ tablet Take 20 mEq by mouth daily. 02/03/23  Yes [provider]  propranolol (INDERAL) 10 MG tablet Take 10-20 mg by mouth See admin instructions. Take 20mg  (2 tablets) by mouth in the mornings, 10mg  (1 tablet) in the evening, and 10mg  (1 tablet) at night. 12/03/22  Yes [provider]  Psyllium (METAMUCIL FIBER PO) Take 1 Scoop by mouth every other day.   Yes [provider]  tamsulosin (FLOMAX) 0.4 MG  CAPS capsule Take 0.4 mg by mouth daily.    Yes [provider]  torsemide (DEMADEX) 20 MG tablet Take 20 mg by mouth See admin instructions. Take 20mg  (1 tablet) by mouth daily. On Tuesday's and Thursday's, take an extra 20mg  tablet. 11/16/19  Yes [provider]  vitamin B-12 (CYANOCOBALAMIN) 1000 MCG tablet Take 2,000 mcg by mouth daily.   Yes [provider]    Physical Exam: Vitals:   12/02/23 0800 12/02/23 0815 12/02/23 0930 12/02/23 0945  BP: 129/70 124/77  118/60  Pulse: (!) 108 (!) 102  88  Resp: (!) 28 20  (!) 25  Temp:   98.8 F (37.1 C)   TempSrc:   Oral   SpO2: 93% 95%  96%  Weight:      Height:        Constitutional: NAD, calm, comfortable Vitals:   12/02/23 0800 12/02/23 0815 12/02/23 0930 12/02/23 0945  BP: 129/70 124/77  118/60  Pulse: (!) 108 (!) 102  88  Resp: (!) 28 20  (!) 25  Temp:   98.8 F (37.1 C)   TempSrc:   Oral   SpO2: 93% 95%  96%  Weight:      Height:       Eyes: lids and conjunctivae normal Neck: normal, supple Respiratory: clear to auscultation bilaterally. Normal respiratory effort. No accessory muscle use.  Cardiovascular: Regular rate and rhythm, no murmurs. Abdomen: no tenderness, no distention. Bowel sounds positive.  Musculoskeletal:  No edema. Skin: no  rashes, lesions, ulcers.  Psychiatric: Flat affect  Labs on Admission: I have personally reviewed following labs and imaging studies  CBC: Recent Labs  Lab 12/02/23 0749  WBC 16.8*  NEUTROABS 15.9*  HGB 14.3  HCT 43.7  MCV 91.4  PLT 325   Basic Metabolic Panel: Recent Labs  Lab 12/02/23 0749  NA 134*  K 3.6  CL 99  CO2 20*  GLUCOSE 176*  BUN 14  CREATININE 1.26*  CALCIUM 9.1   GFR: Estimated Creatinine Clearance: 59.8 mL/min (A) (by C-G formula based on SCr of 1.26 mg/dL (H)). Liver Function Tests: Recent Labs  Lab 12/02/23 0749  AST 28  ALT 17  ALKPHOS 62  BILITOT 0.7  PROT 7.1  ALBUMIN 3.1*   No results for input(s): "LIPASE", "AMYLASE" in the last 168 hours. No results for input(s): "AMMONIA" in the last 168 hours. Coagulation Profile: No results for input(s): "INR", "PROTIME" in the last 168 hours. Cardiac Enzymes: No results for input(s): "CKTOTAL", "CKMB", "CKMBINDEX", "TROPONINI" in the last 168 hours. BNP (last 3 results) No results for input(s): "PROBNP" in the last 8760 hours. HbA1C: No results for input(s): "HGBA1C" in the last 72 hours. CBG: No results for input(s): "GLUCAP" in the last 168 hours. Lipid Profile: No results for input(s): "CHOL", "HDL", "LDLCALC", "TRIG", "CHOLHDL", "LDLDIRECT" in the last 72 hours. Thyroid Function Tests: No results for input(s): "TSH", "T4TOTAL", "FREET4", "T3FREE", "THYROIDAB" in the last 72 hours. Anemia Panel: No results for input(s): "VITAMINB12", "FOLATE", "FERRITIN", "TIBC", "IRON", "RETICCTPCT" in the last 72 hours. Urine analysis:    Component Value Date/Time   COLORURINE YELLOW 12/02/2023 0944   APPEARANCEUR HAZY (A) 12/02/2023 0944   APPEARANCEUR Clear 10/07/2023 1054   LABSPEC 1.014 12/02/2023 0944   PHURINE 5.0 12/02/2023 0944   GLUCOSEU NEGATIVE 12/02/2023 0944   HGBUR SMALL (A) 12/02/2023 0944   BILIRUBINUR NEGATIVE 12/02/2023 0944   BILIRUBINUR Negative 10/07/2023 1054   KETONESUR  NEGATIVE 12/02/2023 0944   PROTEINUR 30 (  A) 12/02/2023 0944   UROBILINOGEN 0.2 04/18/2021 0830   NITRITE POSITIVE (A) 12/02/2023 0944   LEUKOCYTESUR LARGE (A) 12/02/2023 0944    Radiological Exams on Admission: DG Sinuses Complete Result Date: 12/02/2023 CLINICAL DATA:  Headache, dizziness, fever. EXAM: PARANASAL SINUSES - COMPLETE 3 + VIEW COMPARISON:  None Available. FINDINGS: The paranasal sinus are aerated. There is no evidence of sinus opacification air-fluid levels or mucosal thickening. No significant bone abnormalities are seen. IMPRESSION: Negative. Electronically Signed   By: Lupita Raider M.D.   On: 12/02/2023 08:55   DG Chest 2 View Result Date: 12/02/2023 CLINICAL DATA:  Fever. EXAM: CHEST - 2 VIEW COMPARISON:  10/15/2016 FINDINGS: The cardio pericardial silhouette is enlarged. The lungs are clear without focal pneumonia, edema, pneumothorax or pleural effusion. No acute bony abnormality. Telemetry leads overlie the chest. Status post CABG. IMPRESSION: Enlargement of the cardiopericardial silhouette without acute cardiopulmonary findings. Electronically Signed   By: Kennith Center M.D.   On: 12/02/2023 08:53    EKG: Independently reviewed. Afib 124 bpm.  Assessment/Plan Principal Problem:   Sepsis (HCC) Active Problems:   AF (paroxysmal atrial fibrillation) (HCC)   Acid reflux   Dyslipidemia   S/P coronary artery bypass graft x 3   Wears hearing aid    Sepsis secondary to UTI -CT scan to evaluate for pyelonephritis -Continue on Rocephin empirically -Monitor blood and urine cultures -Lactic acid trend has resolved with repeat at 1.3  Mild AKI -Baseline creatinine 0.9 -Hold nephrotoxic agents -Monitor with IV fluid administration  CAD/dyslipidemia -Prior history of CABG -Continue statin  Hypertension -Continue home medications with exception of losartan and diuretics given AKI  BPH -Continue home medications  Obesity, class I -BMI 33.43   DVT  prophylaxis: Heparin Code Status: Full Family Communication: Wife at bedside 4/4 Disposition Plan:Admit for sepsis/UTI Consults called:None Admission status: Inpatient, Tele  Severity of Illness: The appropriate patient status for this patient is INPATIENT. Inpatient status is judged to be reasonable and necessary in order to provide the required intensity of service to ensure the patient's safety. The patient's presenting symptoms, physical exam findings, and initial radiographic and laboratory data in the context of their chronic comorbidities is felt to place them at high risk for further clinical deterioration. Furthermore, it is not anticipated that the patient will be medically stable for discharge from the hospital within 2 midnights of admission.   * I certify that at the point of admission it is my clinical judgment that the patient will require inpatient hospital care spanning beyond 2 midnights from the point of admission due to high intensity of service, high risk for further deterioration and high frequency of surveillance required.*   Genelle Economou D Mourad Cwikla DO [facility]  If 7PM-7AM, please contact night-coverage [email or web address]  12/02/2023, 12:37 PM

## 2023-12-02 NOTE — Plan of Care (Signed)
  Problem: Education: Goal: Knowledge of General Education information will improve Description: Including pain rating scale, medication(s)/side effects and non-pharmacologic comfort measures Outcome: Progressing   Problem: Clinical Measurements: Goal: Diagnostic test results will improve Outcome: Progressing   Problem: Clinical Measurements: Goal: Respiratory complications will improve Outcome: Progressing   Problem: Coping: Goal: Level of anxiety will decrease Outcome: Progressing

## 2023-12-02 NOTE — Progress Notes (Signed)
   12/02/23 2203  Provider Notification  Provider Name/Title Dr. Thomes Dinning  Date Provider Notified 12/02/23  Time Provider Notified 2204  Notification Reason Critical Result  Test performed and critical result blood culture anerobic bottle 2nd set gram +and -  Date Critical Result Received 12/02/23  Time Critical Result Received 2205

## 2023-12-02 NOTE — ED Notes (Signed)
 Patient aware we need a UA, urinal at bedside.

## 2023-12-02 NOTE — ED Provider Notes (Signed)
 [place] EMERGENCY DEPARTMENT AT [facility] Provider Note   CSN: [id number] Arrival date & time: 12/02/23  0865     History  Chief Complaint  Patient presents with   Dizziness   Emesis    Darius Grimes is a 78 y.o. male.  Patient complains of weakness dizziness vomiting.  Patient has a history of hypertension  The history is provided by the patient and medical records. No language interpreter was used.  Dizziness Quality:  Imbalance Severity:  Moderate Onset quality:  Sudden Timing:  Constant Progression:  Worsening Chronicity:  New Relieved by:  Nothing Worsened by:  Nothing Associated symptoms: vomiting   Associated symptoms: no chest pain, no diarrhea and no headaches   Emesis Associated symptoms: no abdominal pain, no cough, no diarrhea and no headaches        Home Medications Prior to Admission medications   Medication Sig Start Date End Date Taking? Authorizing Provider  acetaminophen (TYLENOL) 650 MG CR tablet Take 650 mg by mouth as needed for pain.    [provider]  allopurinol (ZYLOPRIM) 300 MG tablet Take 1 tablet (300 mg total) by mouth daily. Patient taking differently: Take 300 mg by mouth daily as needed. 11/25/20   Darreld Mclean, MD  amLODipine (NORVASC) 10 MG tablet Take 10 mg by mouth at bedtime.    [provider]  aspirin EC 81 MG tablet Take 81 mg by mouth daily. Swallow whole.    [provider]  atorvastatin (LIPITOR) 10 MG tablet Take 10 mg by mouth daily.    [provider]  finasteride (PROSCAR) 5 MG tablet Take 5 mg by mouth daily.    [provider]  losartan (COZAAR) 100 MG tablet Take 100 mg by mouth daily. Take 1 daily    [provider]  meloxicam (MOBIC) 15 MG tablet Take 15 mg by mouth daily. 01/07/20   [provider]  Multiple Vitamin (MULTIVITAMIN WITH MINERALS) TABS tablet Take 1 tablet by mouth daily.    [provider]  Omega-3 Fatty Acids  (FISH OIL) 1200 MG CAPS Take 2 capsules (2,400 mg total) by mouth in the morning and at bedtime. 02/01/20   Fabio Pierce, MD  pantoprazole (PROTONIX) 40 MG tablet Take 40 mg by mouth daily.    [provider]  Potassium Chloride ER 20 MEQ TBCR Take 20-40 mEq by mouth See admin instructions. Take 40 mEq by mouth in the morning and 20 mEq in the evening 03/06/20   [provider]  potassium chloride SA (KLOR-CON M) 20 MEQ tablet Take 1 tablet by mouth daily. 02/03/23   [provider]  propranolol (INDERAL) 10 MG tablet Take by mouth. 12/03/22   [provider]  Psyllium (METAMUCIL FIBER PO) Take 1 Scoop by mouth every other day.    [provider]  tamsulosin (FLOMAX) 0.4 MG CAPS capsule Take 0.4 mg by mouth daily.     [provider]  torsemide (DEMADEX) 20 MG tablet Take 20-40 mg by mouth See admin instructions. Take 40 mg by mouth in the morning and 20 mg in the evening 11/16/19   [provider]  vitamin B-12 (CYANOCOBALAMIN) 1000 MCG tablet Take 2,000 mcg by mouth daily.    [provider]      Allergies    Alfuzosin, Pravastatin, and Terazosin    Review of Systems   Review of Systems  Constitutional:  Negative for appetite change and fatigue.  HENT:  Negative for  congestion, ear discharge and sinus pressure.   Eyes:  Negative for discharge.  Respiratory:  Negative for cough.   Cardiovascular:  Negative for chest pain.  Gastrointestinal:  Positive for vomiting. Negative for abdominal pain and diarrhea.  Genitourinary:  Negative for frequency and hematuria.  Musculoskeletal:  Negative for back pain.  Skin:  Negative for rash.  Neurological:  Positive for dizziness. Negative for seizures and headaches.  Psychiatric/Behavioral:  Negative for hallucinations.     Physical Exam Updated Vital Signs BP 118/60   Pulse 88   Temp 98.8 F (37.1 C) (Oral)   Resp (!) 25   Ht 5\' 10"  (1.778 m)   Wt 105.7 kg   SpO2 96%   BMI  33.43 kg/m  Physical Exam Vitals and nursing note reviewed.  Constitutional:      Appearance: He is well-developed.  HENT:     Head: Normocephalic.     Nose: Nose normal.  Eyes:     General: No scleral icterus.    Conjunctiva/sclera: Conjunctivae normal.  Neck:     Thyroid: No thyromegaly.  Cardiovascular:     Rate and Rhythm: Normal rate and regular rhythm.     Heart sounds: No murmur heard.    No friction rub. No gallop.  Pulmonary:     Breath sounds: No stridor. No wheezing or rales.  Chest:     Chest wall: No tenderness.  Abdominal:     General: There is no distension.     Tenderness: There is no abdominal tenderness. There is no rebound.  Musculoskeletal:        General: Normal range of motion.     Cervical back: Neck supple.  Lymphadenopathy:     Cervical: No cervical adenopathy.  Skin:    Findings: No erythema or rash.  Neurological:     Mental Status: He is alert and oriented to person, place, and time.     Motor: No abnormal muscle tone.     Coordination: Coordination normal.  Psychiatric:        Behavior: Behavior normal.     ED Results / Procedures / Treatments   Labs (all labs ordered are listed, but only abnormal results are displayed) Labs Reviewed  CBC WITH DIFFERENTIAL/PLATELET - Abnormal; Notable for the following components:      Result Value   WBC 16.8 (*)    Neutro Abs 15.9 (*)    Lymphs Abs 0.3 (*)    Abs Immature Granulocytes 0.11 (*)    All other components within normal limits  COMPREHENSIVE METABOLIC PANEL WITH GFR - Abnormal; Notable for the following components:   Sodium 134 (*)    CO2 20 (*)    Glucose, Bld 176 (*)    Creatinine, Ser 1.26 (*)    Albumin 3.1 (*)    GFR, Estimated 59 (*)    All other components within normal limits  LACTIC ACID, PLASMA - Abnormal; Notable for the following components:   Lactic Acid, Venous 3.6 (*)    All other components within normal limits  URINALYSIS, ROUTINE W REFLEX MICROSCOPIC - Abnormal;  Notable for the following components:   APPearance HAZY (*)    Hgb urine dipstick SMALL (*)    Protein, ur 30 (*)    Nitrite POSITIVE (*)    Leukocytes,Ua LARGE (*)    Bacteria, UA FEW (*)    All other components within normal limits  RESP PANEL BY RT-PCR (RSV, FLU A&B, COVID)  [id number]  CULTURE, BLOOD (ROUTINE X 2)  CULTURE, BLOOD (ROUTINE X 2)  URINE CULTURE  LACTIC ACID, PLASMA  LYME DISEASE SEROLOGY W/REFLEX    EKG EKG Interpretation Date/Time:  Friday December 02 2023 07:41:28 EDT Ventricular Rate:  124 PR Interval:    QRS Duration:  166 QT Interval:  358 QTC Calculation: 515 R Axis:   -78  Text Interpretation: Atrial fibrillation RBBB and LAFB Confirmed by Bethann Berkshire [id number]) on 12/02/2023 7:51:58 AM  Radiology DG Sinuses Complete Result Date: 12/02/2023 CLINICAL DATA:  Headache, dizziness, fever. EXAM: PARANASAL SINUSES - COMPLETE 3 + VIEW COMPARISON:  None Available. FINDINGS: The paranasal sinus are aerated. There is no evidence of sinus opacification air-fluid levels or mucosal thickening. No significant bone abnormalities are seen. IMPRESSION: Negative. Electronically Signed   By: Lupita Raider M.D.   On: 12/02/2023 08:55   DG Chest 2 View Result Date: 12/02/2023 CLINICAL DATA:  Fever. EXAM: CHEST - 2 VIEW COMPARISON:  10/15/2016 FINDINGS: The cardio pericardial silhouette is enlarged. The lungs are clear without focal pneumonia, edema, pneumothorax or pleural effusion. No acute bony abnormality. Telemetry leads overlie the chest. Status post CABG. IMPRESSION: Enlargement of the cardiopericardial silhouette without acute cardiopulmonary findings. Electronically Signed   By: Kennith Center M.D.   On: 12/02/2023 08:53    Procedures Procedures    Medications Ordered in ED Medications  acetaminophen (TYLENOL) tablet 650 mg (650 mg Oral Given 12/02/23 0809)  sodium chloride 0.9 % bolus 1,000 mL (0 mLs Intravenous Stopped 12/02/23 1059)  cefTRIAXone (ROCEPHIN) 2 g in sodium  chloride 0.9 % 100 mL IVPB (2 g Intravenous New Bag/Given 12/02/23 1017)    ED Course/ Medical Decision Making/ A&P                                 Medical Decision Making Amount and/or Complexity of Data Reviewed Labs: ordered. Radiology: ordered.  Risk OTC drugs. Decision regarding hospitalization.   Patient with urinary tract infection.  He will be started on Rocephin and admitted for IV antibiotics        Final Clinical Impression(s) / ED Diagnoses Final diagnoses:  Acute cystitis with hematuria    Rx / DC Orders ED Discharge Orders     None         Bethann Berkshire, MD 12/04/23 1018

## 2023-12-03 DIAGNOSIS — A419 Sepsis, unspecified organism: Secondary | ICD-10-CM | POA: Diagnosis not present

## 2023-12-03 DIAGNOSIS — N179 Acute kidney failure, unspecified: Secondary | ICD-10-CM | POA: Diagnosis not present

## 2023-12-03 DIAGNOSIS — R6520 Severe sepsis without septic shock: Secondary | ICD-10-CM | POA: Diagnosis not present

## 2023-12-03 LAB — BLOOD CULTURE ID PANEL (REFLEXED) - BCID2

## 2023-12-03 LAB — BASIC METABOLIC PANEL WITH GFR
Anion gap: 9 (ref 5–15)
BUN: 14 mg/dL (ref 8–23)
CO2: 23 mmol/L (ref 22–32)
Calcium: 8.3 mg/dL — ABNORMAL LOW (ref 8.9–10.3)
Chloride: 104 mmol/L (ref 98–111)
Creatinine, Ser: 1.01 mg/dL (ref 0.61–1.24)
GFR, Estimated: >60 mL/min (ref >60)
Glucose, Bld: 110 mg/dL — ABNORMAL HIGH (ref 70–99)
Potassium: 3.4 mmol/L — ABNORMAL LOW (ref 3.5–5.1)
Sodium: 136 mmol/L (ref 135–145)

## 2023-12-03 LAB — CBC
HCT: 38.7 % — ABNORMAL LOW (ref 39.0–52.0)
Hemoglobin: 12.8 g/dL — ABNORMAL LOW (ref 13.0–17.0)
MCH: 30.3 pg (ref 26.0–34.0)
MCHC: 33.1 g/dL (ref 30.0–36.0)
MCV: 91.7 fL (ref 80.0–100.0)
Platelets: 250 10*3/uL (ref 150–400)
RBC: 4.22 MIL/uL (ref 4.22–5.81)
RDW: 15.1 % (ref 11.5–15.5)
WBC: 11.1 10*3/uL — ABNORMAL HIGH (ref 4.0–10.5)
nRBC: 0 % (ref 0.0–0.2)

## 2023-12-03 LAB — MAGNESIUM: Magnesium: 1.6 mg/dL — ABNORMAL LOW (ref 1.7–2.4)

## 2023-12-03 MED ORDER — FISH OIL 1200 MG PO CAPS: 2400.0000 mg | ORAL_CAPSULE | Freq: Two times a day (BID) | ORAL | Status: DC

## 2023-12-03 MED ORDER — OMEGA-3-ACID ETHYL ESTERS 1 G PO CAPS
2.0000 g | ORAL_CAPSULE | Freq: Two times a day (BID) | ORAL | Status: DC
  Administered 2023-12-03 – 2023-12-05 (×4): 2 g via ORAL
  Filled 2023-12-03 (×4): qty 2

## 2023-12-03 MED ORDER — MAGNESIUM SULFATE 2 GM/50ML IV SOLN
2.0000 g | Freq: Once | INTRAVENOUS | Status: AC
  Administered 2023-12-03: 2 g via INTRAVENOUS
  Filled 2023-12-03: qty 50

## 2023-12-03 MED ORDER — POTASSIUM CHLORIDE CRYS ER 20 MEQ PO TBCR
40.0000 meq | EXTENDED_RELEASE_TABLET | Freq: Two times a day (BID) | ORAL | Status: AC
  Administered 2023-12-03 (×2): 40 meq via ORAL
  Filled 2023-12-03 (×2): qty 2

## 2023-12-03 MED ORDER — MECLIZINE HCL 12.5 MG PO TABS
12.5000 mg | ORAL_TABLET | Freq: Three times a day (TID) | ORAL | Status: DC | PRN
Start: 2023-12-03 — End: 2023-12-05
  Administered 2023-12-03 – 2023-12-04 (×3): 12.5 mg via ORAL
  Filled 2023-12-03 (×3): qty 1

## 2023-12-03 NOTE — Progress Notes (Signed)
 PROGRESS NOTE    Darius Grimes  NFA:[id number] DOB: 1946-05-03 DOA: 12/02/2023 PCP: Benita Stabile, MD   Brief Narrative:   Darius Grimes is a 78 y.o. male with medical history significant for CAD with prior CABG, atrial fibrillation, hypertension, dyslipidemia, GERD, and BPH who presented to the ED with some fevers, nausea, vomiting, dizziness for over the last 2 weeks.  He had COVID testing done which was negative.  Patient was admitted for sepsis secondary to E. coli UTI with bacteremia along with AKI.  Assessment & Plan:   Principal Problem:   Sepsis (HCC) Active Problems:   AF (paroxysmal atrial fibrillation) (HCC)   Acid reflux   Dyslipidemia   S/P coronary artery bypass graft x 3   Wears hearing aid  Assessment and Plan:   Sepsis secondary to E. coli UTI with bacteremia -CT scan to evaluate for pyelonephritis negative -Noted to have renal mass that will need follow-up -Continue on Rocephin empirically -Monitor blood and urine cultures for further sensitivities   Mild AKI-resolved -Baseline creatinine 0.9 -Hold nephrotoxic agents -Monitor with IV fluid administration   CAD/dyslipidemia -Prior history of CABG -Continue statin   Hypertension -Continue home medications with exception of losartan and diuretics given AKI -Blood pressures are currently stable, continue to monitor plan to resume on discharge   BPH -Continue home medications  Dizziness -Trial of meclizine   Obesity, class I -BMI 33.43    DVT prophylaxis: Heparin Code Status: Full Family Communication: None at bedside Disposition Plan:  Status is: Inpatient Remains inpatient appropriate because: Need for ongoing IV medications.   Consultants:  None  Procedures:  None  Antimicrobials:  Anti-infectives (From admission, onward)    Start     Dose/Rate Route Frequency Ordered Stop   12/03/23 1000  cefTRIAXone (ROCEPHIN) 2 g in sodium chloride 0.9 % 100 mL IVPB        2 g 200 mL/hr  over 30 Minutes Intravenous Every 24 hours 12/02/23 1258     12/02/23 1015  cefTRIAXone (ROCEPHIN) 2 g in sodium chloride 0.9 % 100 mL IVPB        2 g 200 mL/hr over 30 Minutes Intravenous  Once 12/02/23 1012 12/02/23 1047      Subjective: Patient seen and evaluated today with no new acute complaints or concerns. No acute concerns or events noted overnight.  He is still feeling quite weak and is having some dizziness with ambulation.  Objective: Vitals:   12/02/23 2059 12/02/23 2238 12/03/23 0517 12/03/23 0802  BP: 135/79  132/73   Pulse: (!) 106  86   Resp: 20     Temp: (!) 100.4 F (38 C) 98.6 F (37 C) 99 F (37.2 C) 99.5 F (37.5 C)  TempSrc: Oral Oral Oral   SpO2: 93%  95%   Weight:      Height:        Intake/Output Summary (Last 24 hours) at 12/03/2023 1218 Last data filed at 12/03/2023 0315 Gross per 24 hour  Intake 1053.75 ml  Output --  Net 1053.75 ml   Filed Weights   12/02/23 0741  Weight: 105.7 kg    Examination:  General exam: Appears calm and comfortable  Respiratory system: Clear to auscultation. Respiratory effort normal. Cardiovascular system: S1 & S2 heard, RRR.  Gastrointestinal system: Abdomen is soft Central nervous system: Alert and awake Extremities: No edema Skin: No significant lesions noted Psychiatry: Flat affect.    Data Reviewed: I have personally reviewed following labs and  imaging studies  CBC: Recent Labs  Lab 12/02/23 0749 12/03/23 0355  WBC 16.8* 11.1*  NEUTROABS 15.9*  --   HGB 14.3 12.8*  HCT 43.7 38.7*  MCV 91.4 91.7  PLT 325 250   Basic Metabolic Panel: Recent Labs  Lab 12/02/23 0749 12/03/23 0355  NA 134* 136  K 3.6 3.4*  CL 99 104  CO2 20* 23  GLUCOSE 176* 110*  BUN 14 14  CREATININE 1.26* 1.01  CALCIUM 9.1 8.3*  MG  --  1.6*   GFR: Estimated Creatinine Clearance: 74.6 mL/min (by C-G formula based on SCr of 1.01 mg/dL). Liver Function Tests: Recent Labs  Lab 12/02/23 0749  AST 28  ALT 17   ALKPHOS 62  BILITOT 0.7  PROT 7.1  ALBUMIN 3.1*   No results for input(s): "LIPASE", "AMYLASE" in the last 168 hours. No results for input(s): "AMMONIA" in the last 168 hours. Coagulation Profile: No results for input(s): "INR", "PROTIME" in the last 168 hours. Cardiac Enzymes: No results for input(s): "CKTOTAL", "CKMB", "CKMBINDEX", "TROPONINI" in the last 168 hours. BNP (last 3 results) No results for input(s): "PROBNP" in the last 8760 hours. HbA1C: No results for input(s): "HGBA1C" in the last 72 hours. CBG: No results for input(s): "GLUCAP" in the last 168 hours. Lipid Profile: No results for input(s): "CHOL", "HDL", "LDLCALC", "TRIG", "CHOLHDL", "LDLDIRECT" in the last 72 hours. Thyroid Function Tests: No results for input(s): "TSH", "T4TOTAL", "FREET4", "T3FREE", "THYROIDAB" in the last 72 hours. Anemia Panel: No results for input(s): "VITAMINB12", "FOLATE", "FERRITIN", "TIBC", "IRON", "RETICCTPCT" in the last 72 hours. Sepsis Labs: Recent Labs  Lab 12/02/23 0909 12/02/23 1106  LATICACIDVEN 3.6* 1.3    Recent Results (from the past 240 hours)  Resp panel by RT-PCR (RSV, Flu A&B, Covid) Anterior Nasal Swab     Status: None   Collection Time: 12/02/23  8:14 AM   Specimen: Anterior Nasal Swab  Result Value Ref Range Status   SARS Coronavirus 2 by RT PCR NEGATIVE NEGATIVE Final    Comment: (NOTE) SARS-CoV-2 target nucleic acids are NOT DETECTED.  The SARS-CoV-2 RNA is generally detectable in upper respiratory specimens during the acute phase of infection. The lowest concentration of SARS-CoV-2 viral copies this assay can detect is 138 copies/mL. A negative result does not preclude SARS-Cov-2 infection and should not be used as the sole basis for treatment or other patient management decisions. A negative result may occur with  improper specimen collection/handling, submission of specimen other than nasopharyngeal swab, presence of viral mutation(s) within  the areas targeted by this assay, and inadequate number of viral copies(<138 copies/mL). A negative result must be combined with clinical observations, patient history, and epidemiological information. The expected result is Negative.  Fact Sheet for Patients:  BloggerCourse.com  Fact Sheet for Healthcare Providers:  SeriousBroker.it  This test is no t yet approved or cleared by the [place] FDA and  has been authorized for detection and/or diagnosis of SARS-CoV-2 by FDA under an Emergency Use Authorization (EUA). This EUA will remain  in effect (meaning this test can be used) for the duration of the COVID-19 declaration under Section 564(b)(1) of the Act, 21 U.S.C.section 360bbb-3(b)(1), unless the authorization is terminated  or revoked sooner.       Influenza A by PCR NEGATIVE NEGATIVE Final   Influenza B by PCR NEGATIVE NEGATIVE Final    Comment: (NOTE) The Xpert Xpress SARS-CoV-2/FLU/RSV plus assay is intended as an aid in the diagnosis of influenza from Nasopharyngeal swab  specimens and should not be used as a sole basis for treatment. Nasal washings and aspirates are unacceptable for Xpert Xpress SARS-CoV-2/FLU/RSV testing.  Fact Sheet for Patients: BloggerCourse.com  Fact Sheet for Healthcare Providers: SeriousBroker.it  This test is not yet approved or cleared by the [place] FDA and has been authorized for detection and/or diagnosis of SARS-CoV-2 by FDA under an Emergency Use Authorization (EUA). This EUA will remain in effect (meaning this test can be used) for the duration of the COVID-19 declaration under Section 564(b)(1) of the Act, 21 U.S.C. section 360bbb-3(b)(1), unless the authorization is terminated or revoked.     Resp Syncytial Virus by PCR NEGATIVE NEGATIVE Final    Comment: (NOTE) Fact Sheet for  Patients: BloggerCourse.com  Fact Sheet for Healthcare Providers: SeriousBroker.it  This test is not yet approved or cleared by the [place] FDA and has been authorized for detection and/or diagnosis of SARS-CoV-2 by FDA under an Emergency Use Authorization (EUA). This EUA will remain in effect (meaning this test can be used) for the duration of the COVID-19 declaration under Section 564(b)(1) of the Act, 21 U.S.C. section 360bbb-3(b)(1), unless the authorization is terminated or revoked.  Performed at [facility], [address] [place], [place] [address]   Blood culture (routine x 2)     Status: None (Preliminary result)   Collection Time: 12/02/23  9:08 AM   Specimen: BLOOD  Result Value Ref Range Status   Specimen Description BLOOD BLOOD RIGHT ARM  Final   Special Requests   Final    BOTTLES DRAWN AEROBIC AND ANAEROBIC Blood Culture adequate volume   Culture  Setup Time GRAM NEGATIVE RODS  Final   Culture   Final    GRAM NEGATIVE RODS ANAEROBIC BOTTLE ONLY Gram Stain Report Called to,Read Back By and Verified With: TINA E. ON 12/02/2023 @8 :33PM BY T.HAMER  Performed at [facility], [address] [place], [place] [address]    Report Status PENDING  Incomplete  Blood culture (routine x 2)     Status: Abnormal (Preliminary result)   Collection Time: 12/02/23  9:09 AM   Specimen: BLOOD  Result Value Ref Range Status   Specimen Description   Final    BLOOD BLOOD RIGHT HAND Performed at [facility], [address], [place], [place] [address]    Special Requests   Final    BOTTLES DRAWN AEROBIC AND ANAEROBIC Blood Culture adequate volume Performed at [facility], [address] [place], [place] [address]    Culture  Setup Time (A)  Final    GRAM VARIABLE ROD ANAEROBIC BOTTLE ONLY CRITICAL RESULT CALLED TO, READ BACK BY AND VERIFIED WITH: BROWN,S ON 12/02/23 A 2202 BY PURDIE,J GRAM NEGATIVE RODS AEROBIC BOTTLE  ONLY CRITICAL VALUE NOTED.  VALUE IS CONSISTENT WITH PREVIOUSLY REPORTED AND CALLED VALUE. CRITICAL RESULT CALLED TO, READ BACK BY AND VERIFIED WITH: Theora Gianotti RN 12/03/2023 @ 0304 BY AB Performed at [facility], [address], [place], [place] [address]    Culture Romie Minus VARIABLE ROD GRAM NEGATIVE RODS  (A)  Final   Report Status PENDING  Incomplete  Blood Culture ID Panel (Reflexed)     Status: Abnormal   Collection Time: 12/02/23  9:09 AM  Result Value Ref Range Status   Enterococcus faecalis NOT DETECTED NOT DETECTED Final   Enterococcus Faecium NOT DETECTED NOT DETECTED Final   Listeria monocytogenes NOT DETECTED NOT DETECTED Final   Staphylococcus species NOT DETECTED NOT DETECTED Final   Staphylococcus  aureus (BCID) NOT DETECTED NOT DETECTED Final   Staphylococcus epidermidis NOT DETECTED NOT DETECTED Final   Staphylococcus lugdunensis NOT DETECTED NOT DETECTED Final   Streptococcus species NOT DETECTED NOT DETECTED Final   Streptococcus agalactiae NOT DETECTED NOT DETECTED Final   Streptococcus pneumoniae NOT DETECTED NOT DETECTED Final   Streptococcus pyogenes NOT DETECTED NOT DETECTED Final   A.calcoaceticus-baumannii NOT DETECTED NOT DETECTED Final   Bacteroides fragilis NOT DETECTED NOT DETECTED Final   Enterobacterales DETECTED (A) NOT DETECTED Final    Comment: Enterobacterales represent a large order of gram negative bacteria, not a single organism. CRITICAL RESULT CALLED TO, READ BACK BY AND VERIFIED WITH: S BROWN RN 12/03/2023 @ 0304 BY AB    Enterobacter cloacae complex NOT DETECTED NOT DETECTED Final   Escherichia coli DETECTED (A) NOT DETECTED Final    Comment: CRITICAL RESULT CALLED TO, READ BACK BY AND VERIFIED WITH: S BROWN RN 12/03/2023 @ 0304 BY AB    Klebsiella aerogenes NOT DETECTED NOT DETECTED Final   Klebsiella oxytoca NOT DETECTED NOT DETECTED Final   Klebsiella pneumoniae NOT DETECTED NOT DETECTED Final   Proteus species NOT DETECTED NOT DETECTED  Final   Salmonella species NOT DETECTED NOT DETECTED Final   Serratia marcescens NOT DETECTED NOT DETECTED Final   Haemophilus influenzae NOT DETECTED NOT DETECTED Final   Neisseria meningitidis NOT DETECTED NOT DETECTED Final   Pseudomonas aeruginosa NOT DETECTED NOT DETECTED Final   Stenotrophomonas maltophilia NOT DETECTED NOT DETECTED Final   Candida albicans NOT DETECTED NOT DETECTED Final   Candida auris NOT DETECTED NOT DETECTED Final   Candida glabrata NOT DETECTED NOT DETECTED Final   Candida krusei NOT DETECTED NOT DETECTED Final   Candida parapsilosis NOT DETECTED NOT DETECTED Final   Candida tropicalis NOT DETECTED NOT DETECTED Final   Cryptococcus neoformans/gattii NOT DETECTED NOT DETECTED Final   CTX-M ESBL NOT DETECTED NOT DETECTED Final   Carbapenem resistance IMP NOT DETECTED NOT DETECTED Final   Carbapenem resistance KPC NOT DETECTED NOT DETECTED Final   Carbapenem resistance NDM NOT DETECTED NOT DETECTED Final   Carbapenem resist OXA 48 LIKE NOT DETECTED NOT DETECTED Final   Carbapenem resistance VIM NOT DETECTED NOT DETECTED Final    Comment: Performed at [facility], [address], [place], [place] [address]         Radiology Studies: CT ABDOMEN PELVIS W CONTRAST Result Date: 12/02/2023 CLINICAL DATA:  Increased urinary frequency. Blood and urine leukocytosis. Clinical concern for pyelonephritis. EXAM: CT ABDOMEN AND PELVIS WITH CONTRAST TECHNIQUE: Multidetector CT imaging of the abdomen and pelvis was performed using the standard protocol following bolus administration of intravenous contrast. RADIATION DOSE REDUCTION: This exam was performed according to the departmental dose-optimization program which includes automated exposure control, adjustment of the mA and/or kV according to patient size and/or use of iterative reconstruction technique. CONTRAST:  OMNIPAQUE IOHEXOL 300 MG/ML  SOLN COMPARISON:  03/06/2007 FINDINGS: Lower chest: Enlarged heart.  Prominent vasculature at the lung bases with no pulmonary edema or pleural fluid. Hepatobiliary: No focal liver abnormality is seen. No gallstones, gallbladder wall thickening, or biliary dilatation. Pancreas: Unremarkable. No pancreatic ductal dilatation or surrounding inflammatory changes. Spleen: Normal in size without focal abnormality. Adrenals/Urinary Tract: Unremarkable adrenal glands. Multiple subcentimeter calculi in both kidneys. Small bilateral simple appearing renal cysts. These do not need imaging follow-up. 2.8 cm oval, heterogeneous, low and medium density area in the lower pole of the left kidney, not definitely seen previously without intravenous contrast.  No bladder or ureteral calculi and no hydronephrosis. Mild diffuse bladder wall thickening. Mildly enlarged prostate gland with protrusion of the median lobe into the base of the urinary bladder. Stomach/Bowel: Multiple sigmoid colon diverticula without evidence of diverticulitis. Unremarkable stomach, small bowel and appendix. Vascular/Lymphatic: Atheromatous arterial calcifications without aneurysm. No enlarged lymph nodes. Reproductive: Mildly enlarged prostate gland. Other: Moderate-sized left inguinal hernia containing fat with some edematous changes in the herniated fat. Small umbilical and right inguinal hernias containing fat. Musculoskeletal: Lumbar and lower thoracic spine degenerative changes. Approximately 25% old L2 superior endplate compression deformity with Schmorl's node formation at the L2-3 level. Disc space narrowing and mild posterior spur formation at the L2-3 level with minimal grade 1 retrolisthesis. Facet degenerative changes with mild grade 1 anterolisthesis at the L4-5 level. IMPRESSION: 1. 2.8 cm oval, heterogeneous, low and medium density area in the lower pole of the left kidney. This could represent isolated pyelonephritis, complicated cyst or a solid mass. Recommend a follow-up CT or MRI without and with contrast in  1 month, following appropriate antibiotic therapy for urinary tract infection. 2. Bilateral nephrolithiasis. No ureteral or bladder calculi and no hydronephrosis. 3. Mild diffuse bladder wall thickening, likely due to chronic bladder outlet obstruction. 4. Mildly enlarged prostate gland with protrusion of the median lobe into the base of the urinary bladder. 5. Moderate-sized left inguinal hernia containing fat with some edematous changes in the herniated fat. 6. Small umbilical and right inguinal hernias containing fat. 7. Sigmoid colon diverticulosis. 8. Cardiomegaly with pulmonary vascular congestion. Electronically Signed   By: Beckie Salts M.D.   On: 12/02/2023 15:47   DG Sinuses Complete Result Date: 12/02/2023 CLINICAL DATA:  Headache, dizziness, fever. EXAM: PARANASAL SINUSES - COMPLETE 3 + VIEW COMPARISON:  None Available. FINDINGS: The paranasal sinus are aerated. There is no evidence of sinus opacification air-fluid levels or mucosal thickening. No significant bone abnormalities are seen. IMPRESSION: Negative. Electronically Signed   By: Lupita Raider M.D.   On: 12/02/2023 08:55   DG Chest 2 View Result Date: 12/02/2023 CLINICAL DATA:  Fever. EXAM: CHEST - 2 VIEW COMPARISON:  10/15/2016 FINDINGS: The cardio pericardial silhouette is enlarged. The lungs are clear without focal pneumonia, edema, pneumothorax or pleural effusion. No acute bony abnormality. Telemetry leads overlie the chest. Status post CABG. IMPRESSION: Enlargement of the cardiopericardial silhouette without acute cardiopulmonary findings. Electronically Signed   By: Kennith Center M.D.   On: 12/02/2023 08:53        Scheduled Meds:  amLODipine  10 mg Oral QHS   vitamin C  1,000 mg Oral Daily   aspirin EC  81 mg Oral Daily   atorvastatin  10 mg Oral Q M,W,F   cyanocobalamin  2,000 mcg Oral Daily   finasteride  5 mg Oral Daily   heparin  5,000 Units Subcutaneous Q8H   omega-3 acid ethyl esters  2 g Oral BID   pantoprazole   40 mg Oral Daily   potassium chloride  40 mEq Oral BID   propranolol  10 mg Oral BID   propranolol  20 mg Oral Q breakfast   tamsulosin  0.4 mg Oral Daily   Continuous Infusions:  sodium chloride 75 mL/hr at 12/03/23 0315   cefTRIAXone (ROCEPHIN)  IV 2 g (12/03/23 1015)     LOS: 1 day    Time spent: 35 minutes    Cameryn Schum Hoover Brunette, DO [facility]  If 7PM-7AM, please contact night-coverage [email or web address] 12/03/2023, 12:18 PM

## 2023-12-03 NOTE — Plan of Care (Signed)

## 2023-12-03 NOTE — Progress Notes (Signed)
   12/03/23 4098  Provider Notification  Provider Name/Title Dr. Thomes Dinning  Date Provider Notified 12/03/23  Time Provider Notified [phone]  Notification Reason Critical Result  Test performed and critical result 3/4 blood cultures positive for e.Coli  Date Critical Result Received 12/03/23  Time Critical Result Received [id number]

## 2023-12-03 NOTE — Progress Notes (Signed)
   12/03/23 1616  TOC Brief Assessment  Insurance and Status Reviewed  Patient has primary care physician Yes  Home environment has been reviewed Single family home  Prior level of function: Independent  Prior/Current Home Services No current home services  Readmission risk has been reviewed Yes  Transition of care needs transition of care needs identified, TOC will continue to follow   Transition of Care Department (TOC) has reviewed patient and no TOC needs have been identified at this time. We will continue to monitor patient advancement through interdisciplinary progression rounds. If new patient transition needs arise, please place a TOC consult.

## 2023-12-03 NOTE — Plan of Care (Signed)
  Problem: Education: Goal: Knowledge of General Education information will improve Description: Including pain rating scale, medication(s)/side effects and non-pharmacologic comfort measures Outcome: Progressing   Problem: Clinical Measurements: Goal: Diagnostic test results will improve Outcome: Progressing   Problem: Activity: Goal: Risk for activity intolerance will decrease Outcome: Progressing   Problem: Coping: Goal: Level of anxiety will decrease Outcome: Progressing

## 2023-12-04 DIAGNOSIS — R6520 Severe sepsis without septic shock: Secondary | ICD-10-CM | POA: Diagnosis not present

## 2023-12-04 DIAGNOSIS — A419 Sepsis, unspecified organism: Secondary | ICD-10-CM | POA: Diagnosis not present

## 2023-12-04 DIAGNOSIS — N179 Acute kidney failure, unspecified: Secondary | ICD-10-CM | POA: Diagnosis not present

## 2023-12-04 LAB — BASIC METABOLIC PANEL WITH GFR
Anion gap: 10 (ref 5–15)
BUN: 13 mg/dL (ref 8–23)
CO2: 22 mmol/L (ref 22–32)
Calcium: 8.3 mg/dL — ABNORMAL LOW (ref 8.9–10.3)
Chloride: 102 mmol/L (ref 98–111)
Creatinine, Ser: 0.92 mg/dL (ref 0.61–1.24)
GFR, Estimated: >60 mL/min (ref >60)
Glucose, Bld: 100 mg/dL — ABNORMAL HIGH (ref 70–99)
Potassium: 3.7 mmol/L (ref 3.5–5.1)
Sodium: 134 mmol/L — ABNORMAL LOW (ref 135–145)

## 2023-12-04 LAB — CBC
HCT: 37.7 % — ABNORMAL LOW (ref 39.0–52.0)
Hemoglobin: 12.3 g/dL — ABNORMAL LOW (ref 13.0–17.0)
MCH: 29.9 pg (ref 26.0–34.0)
MCHC: 32.6 g/dL (ref 30.0–36.0)
MCV: 91.7 fL (ref 80.0–100.0)
Platelets: 226 10*3/uL (ref 150–400)
RBC: 4.11 MIL/uL — ABNORMAL LOW (ref 4.22–5.81)
RDW: 15.1 % (ref 11.5–15.5)
WBC: 8.7 10*3/uL (ref 4.0–10.5)
nRBC: 0 % (ref 0.0–0.2)

## 2023-12-04 LAB — URINE CULTURE
Culture: >=100000 — AB
Culture: NO GROWTH

## 2023-12-04 LAB — MAGNESIUM: Magnesium: 2 mg/dL (ref 1.7–2.4)

## 2023-12-04 MED ORDER — SENNOSIDES-DOCUSATE SODIUM 8.6-50 MG PO TABS
1.0000 | ORAL_TABLET | Freq: Two times a day (BID) | ORAL | Status: DC
Start: 2023-12-04 — End: 2023-12-05
  Administered 2023-12-04 – 2023-12-05 (×2): 1 via ORAL
  Filled 2023-12-04 (×2): qty 1

## 2023-12-04 NOTE — Evaluation (Signed)
 Physical Therapy Evaluation Patient Details Name: Darius Grimes MRN: [id number] DOB: Mar 30, 1946 Today's Date: 12/04/2023  History of Present Illness  Darius Grimes is a 78 y.o. male with medical history significant for CAD with prior CABG, atrial fibrillation, hypertension, dyslipidemia, GERD, and BPH who presented to the ED with some fevers, nausea, vomiting, dizziness for over the last 2 weeks.  He had COVID testing done which was negative.  Patient was admitted for sepsis secondary to E. coli UTI with bacteremia along with AKI.  Clinical Impression  PT does not need skilled PT         If plan is discharge home, recommend the following: Other (comment) (no assist needed)   Can travel by private vehicle    yes    Equipment Recommendations None recommended by PT  Recommendations for Other Services   none    Functional Status Assessment Patient has not had a recent decline in their functional status     Precautions / Restrictions Precautions Precautions: None Restrictions Weight Bearing Restrictions Per Provider Order: No      Mobility  Bed Mobility Overal bed mobility: Independent                  Transfers Overall transfer level: Independent                      Ambulation/Gait Ambulation/Gait assistance: Independent (PT states he has been walking up and down the halls with his wife.) Gait Distance (Feet): 200 Feet Assistive device: IV Pole Gait Pattern/deviations: WFL(Within Functional Limits)   Gait velocity interpretation: >2.62 ft/sec, indicative of community ambulatory         Balance Overall balance assessment: Independent                                           Pertinent Vitals/Pain Pain Assessment Pain Assessment: No/denies pain    Home Living Family/patient expects to be discharged to:: Private residence Living Arrangements: Spouse/significant other                           Extremity/Trunk  Assessment        Lower Extremity Assessment Lower Extremity Assessment: Overall WFL for tasks assessed       Communication        Cognition Arousal: Alert Behavior During Therapy: WFL for tasks assessed/performed   PT - Cognitive impairments: No apparent impairments                         Following commands: Intact       Cueing Cueing Techniques: Verbal cues     General Comments          Assessment/Plan    PT Assessment Patient does not need any further PT services  PT Problem List   none      PT Treatment Interventions   none          AM-PAC PT "6 Clicks" Mobility  Outcome Measure Help needed turning from your back to your side while in a flat bed without using bedrails?: None Help needed moving from lying on your back to sitting on the side of a flat bed without using bedrails?: None Help needed moving to and from a bed to a chair (including a wheelchair)?: None Help needed standing  up from a chair using your arms (e.g., wheelchair or bedside chair)?: None Help needed to walk in hospital room?: None Help needed climbing 3-5 steps with a railing? : A Little 6 Click Score: 23    End of Session Equipment Utilized During Treatment: Gait belt Activity Tolerance: Patient tolerated treatment well Patient left: Other (comment) (in restroom; therapist requested pt to call when he was finished.) Nurse Communication: Mobility status PT Visit Diagnosis: Muscle weakness (generalized) (M62.81)    Time: 1610-9604 PT Time Calculation (min) (ACUTE ONLY): 15 min   Charges:   PT Evaluation $PT Eval Low Complexity: 1 Low   PT General Charges $$ ACUTE PT VISIT: 1 Visit        Virgina Organ, PT CLT [phone]  12/04/2023, 12:32 PM

## 2023-12-04 NOTE — Plan of Care (Signed)

## 2023-12-04 NOTE — Progress Notes (Signed)
 PROGRESS NOTE    Darius Grimes  ZOX:[id number] DOB: 1945/10/18 DOA: 12/02/2023 PCP: Benita Stabile, MD   Brief Narrative:   Darius Grimes is a 78 y.o. male with medical history significant for CAD with prior CABG, atrial fibrillation, hypertension, dyslipidemia, GERD, and BPH who presented to the ED with some fevers, nausea, vomiting, dizziness for over the last 2 weeks.  He had COVID testing done which was negative.  Patient was admitted for sepsis secondary to E. coli UTI with bacteremia along with AKI.  Assessment & Plan:   Principal Problem:   Sepsis (HCC) Active Problems:   AF (paroxysmal atrial fibrillation) (HCC)   Acid reflux   Dyslipidemia   S/P coronary artery bypass graft x 3   Wears hearing aid  Assessment and Plan:   Sepsis secondary to E. coli UTI with bacteremia -CT scan to evaluate for pyelonephritis negative -Noted to have renal mass that will need follow-up with urology outpatient -Continue on Rocephin empirically -Monitor blood and urine cultures for further sensitivities   Mild AKI-resolved -Baseline creatinine 0.9 -Hold nephrotoxic agents -Monitor with IV fluid administration   CAD/dyslipidemia -Prior history of CABG -Continue statin   Hypertension -Continue home medications with exception of losartan and diuretics given AKI -Blood pressures are currently stable, continue to monitor plan to resume on discharge   BPH -Continue home medications  Dizziness -Trial of meclizine -PT evaluation in anticipation for discharge in a.m.   Obesity, class I -BMI 33.43    DVT prophylaxis: Heparin Code Status: Full Family Communication: Wife at bedside 4/6 Disposition Plan:  Status is: Inpatient Remains inpatient appropriate because: Need for ongoing IV medications.   Consultants:  None  Procedures:  None  Antimicrobials:  Anti-infectives (From admission, onward)    Start     Dose/Rate Route Frequency Ordered Stop   12/03/23 1000   cefTRIAXone (ROCEPHIN) 2 g in sodium chloride 0.9 % 100 mL IVPB        2 g 200 mL/hr over 30 Minutes Intravenous Every 24 hours 12/02/23 1258     12/02/23 1015  cefTRIAXone (ROCEPHIN) 2 g in sodium chloride 0.9 % 100 mL IVPB        2 g 200 mL/hr over 30 Minutes Intravenous  Once 12/02/23 1012 12/02/23 1047      Subjective: Patient seen and evaluated today with no new acute complaints or concerns. No acute concerns or events noted overnight.  He is still feeling quite weak and is having some trouble with bowel movements.  Objective: Vitals:   12/03/23 1412 12/03/23 2126 12/04/23 0147 12/04/23 0541  BP: 109/67 135/70  127/77  Pulse: (!) 53 89  100  Resp: 18     Temp: 99.1 F (37.3 C)  98.2 F (36.8 C) 98.7 F (37.1 C)  TempSrc: Oral Oral Oral Oral  SpO2: 94% 91%  95%  Weight:      Height:        Intake/Output Summary (Last 24 hours) at 12/04/2023 1052 Last data filed at 12/04/2023 0425 Gross per 24 hour  Intake 340 ml  Output --  Net 340 ml   Filed Weights   12/02/23 0741  Weight: 105.7 kg    Examination:  General exam: Appears calm and comfortable  Respiratory system: Clear to auscultation. Respiratory effort normal. Cardiovascular system: S1 & S2 heard, RRR.  Gastrointestinal system: Abdomen is soft Central nervous system: Alert and awake Extremities: No edema Skin: No significant lesions noted Psychiatry: Flat affect.  Data Reviewed: I have personally reviewed following labs and imaging studies  CBC: Recent Labs  Lab 12/02/23 0749 12/03/23 0355 12/04/23 0352  WBC 16.8* 11.1* 8.7  NEUTROABS 15.9*  --   --   HGB 14.3 12.8* 12.3*  HCT 43.7 38.7* 37.7*  MCV 91.4 91.7 91.7  PLT 325 250 226   Basic Metabolic Panel: Recent Labs  Lab 12/02/23 0749 12/03/23 0355 12/04/23 0352  NA 134* 136 134*  K 3.6 3.4* 3.7  CL 99 104 102  CO2 20* 23 22  GLUCOSE 176* 110* 100*  BUN 14 14 13   CREATININE 1.26* 1.01 0.92  CALCIUM 9.1 8.3* 8.3*  MG  --  1.6* 2.0    GFR: Estimated Creatinine Clearance: 81.9 mL/min (by C-G formula based on SCr of 0.92 mg/dL). Liver Function Tests: Recent Labs  Lab 12/02/23 0749  AST 28  ALT 17  ALKPHOS 62  BILITOT 0.7  PROT 7.1  ALBUMIN 3.1*   No results for input(s): "LIPASE", "AMYLASE" in the last 168 hours. No results for input(s): "AMMONIA" in the last 168 hours. Coagulation Profile: No results for input(s): "INR", "PROTIME" in the last 168 hours. Cardiac Enzymes: No results for input(s): "CKTOTAL", "CKMB", "CKMBINDEX", "TROPONINI" in the last 168 hours. BNP (last 3 results) No results for input(s): "PROBNP" in the last 8760 hours. HbA1C: No results for input(s): "HGBA1C" in the last 72 hours. CBG: No results for input(s): "GLUCAP" in the last 168 hours. Lipid Profile: No results for input(s): "CHOL", "HDL", "LDLCALC", "TRIG", "CHOLHDL", "LDLDIRECT" in the last 72 hours. Thyroid Function Tests: No results for input(s): "TSH", "T4TOTAL", "FREET4", "T3FREE", "THYROIDAB" in the last 72 hours. Anemia Panel: No results for input(s): "VITAMINB12", "FOLATE", "FERRITIN", "TIBC", "IRON", "RETICCTPCT" in the last 72 hours. Sepsis Labs: Recent Labs  Lab 12/02/23 0909 12/02/23 1106  LATICACIDVEN 3.6* 1.3    Recent Results (from the past 240 hours)  Resp panel by RT-PCR (RSV, Flu A&B, Covid) Anterior Nasal Swab     Status: None   Collection Time: 12/02/23  8:14 AM   Specimen: Anterior Nasal Swab  Result Value Ref Range Status   SARS Coronavirus 2 by RT PCR NEGATIVE NEGATIVE Final    Comment: (NOTE) SARS-CoV-2 target nucleic acids are NOT DETECTED.  The SARS-CoV-2 RNA is generally detectable in upper respiratory specimens during the acute phase of infection. The lowest concentration of SARS-CoV-2 viral copies this assay can detect is 138 copies/mL. A negative result does not preclude SARS-Cov-2 infection and should not be used as the sole basis for treatment or other patient management decisions. A  negative result may occur with  improper specimen collection/handling, submission of specimen other than nasopharyngeal swab, presence of viral mutation(s) within the areas targeted by this assay, and inadequate number of viral copies(<138 copies/mL). A negative result must be combined with clinical observations, patient history, and epidemiological information. The expected result is Negative.  Fact Sheet for Patients:  BloggerCourse.com  Fact Sheet for Healthcare Providers:  SeriousBroker.it  This test is no t yet approved or cleared by the [place] FDA and  has been authorized for detection and/or diagnosis of SARS-CoV-2 by FDA under an Emergency Use Authorization (EUA). This EUA will remain  in effect (meaning this test can be used) for the duration of the COVID-19 declaration under Section 564(b)(1) of the Act, 21 U.S.C.section 360bbb-3(b)(1), unless the authorization is terminated  or revoked sooner.       Influenza A by PCR NEGATIVE NEGATIVE Final   Influenza  B by PCR NEGATIVE NEGATIVE Final    Comment: (NOTE) The Xpert Xpress SARS-CoV-2/FLU/RSV plus assay is intended as an aid in the diagnosis of influenza from Nasopharyngeal swab specimens and should not be used as a sole basis for treatment. Nasal washings and aspirates are unacceptable for Xpert Xpress SARS-CoV-2/FLU/RSV testing.  Fact Sheet for Patients: BloggerCourse.com  Fact Sheet for Healthcare Providers: SeriousBroker.it  This test is not yet approved or cleared by the [place] FDA and has been authorized for detection and/or diagnosis of SARS-CoV-2 by FDA under an Emergency Use Authorization (EUA). This EUA will remain in effect (meaning this test can be used) for the duration of the COVID-19 declaration under Section 564(b)(1) of the Act, 21 U.S.C. section 360bbb-3(b)(1), unless the authorization  is terminated or revoked.     Resp Syncytial Virus by PCR NEGATIVE NEGATIVE Final    Comment: (NOTE) Fact Sheet for Patients: BloggerCourse.com  Fact Sheet for Healthcare Providers: SeriousBroker.it  This test is not yet approved or cleared by the [place] FDA and has been authorized for detection and/or diagnosis of SARS-CoV-2 by FDA under an Emergency Use Authorization (EUA). This EUA will remain in effect (meaning this test can be used) for the duration of the COVID-19 declaration under Section 564(b)(1) of the Act, 21 U.S.C. section 360bbb-3(b)(1), unless the authorization is terminated or revoked.  Performed at [facility], [address] [place], [place] [address]   Blood culture (routine x 2)     Status: None (Preliminary result)   Collection Time: 12/02/23  9:08 AM   Specimen: BLOOD  Result Value Ref Range Status   Specimen Description   Final    BLOOD BLOOD RIGHT ARM Performed at [facility], [address], [place], [place] [address]    Special Requests   Final    BOTTLES DRAWN AEROBIC AND ANAEROBIC Blood Culture adequate volume Performed at [facility], [address], [place], [place] [address]    Culture  Setup Time   Final    GRAM NEGATIVE RODS Performed at [facility], [address], [place], [place] [address]    Culture   Final    GRAM NEGATIVE RODS ANAEROBIC BOTTLE ONLY Gram Stain Report Called to,Read Back By and Verified With: TINA E. ON 12/02/2023 @8 :33PM BY T.HAMER  IDENTIFICATION TO FOLLOW Performed at [facility], [address], [place], [place] [address]    Report Status PENDING  Incomplete  Blood culture (routine x 2)     Status: Abnormal (Preliminary result)   Collection Time: 12/02/23  9:09 AM   Specimen: BLOOD  Result Value Ref Range Status   Specimen Description   Final    BLOOD BLOOD RIGHT HAND Performed at [facility], [address] [place], [place] [address]    Special  Requests   Final    BOTTLES DRAWN AEROBIC AND ANAEROBIC Blood Culture adequate volume Performed at [facility], [address] [place], [place] [address]    Culture  Setup Time (A)  Final    GRAM VARIABLE ROD ANAEROBIC BOTTLE ONLY CRITICAL RESULT CALLED TO, READ BACK BY AND VERIFIED WITH: BROWN,S ON 12/02/23 A 2202 BY PURDIE,J GRAM NEGATIVE RODS AEROBIC BOTTLE ONLY CRITICAL VALUE NOTED.  VALUE IS CONSISTENT WITH PREVIOUSLY REPORTED AND CALLED VALUE. CRITICAL RESULT CALLED TO, READ BACK BY AND VERIFIED WITH: S BROWN RN 12/03/2023 @ 0304 BY AB    Culture (A)  Final    ESCHERICHIA COLI SUSCEPTIBILITIES TO FOLLOW CULTURE REINCUBATED FOR BETTER GROWTH  Performed at [facility], [address] [place], [place] [address]    Report Status PENDING  Incomplete  Blood Culture ID Panel (Reflexed)     Status: Abnormal   Collection Time: 12/02/23  9:09 AM  Result Value Ref Range Status   Enterococcus faecalis NOT DETECTED NOT DETECTED Final   Enterococcus Faecium NOT DETECTED NOT DETECTED Final   Listeria monocytogenes NOT DETECTED NOT DETECTED Final   Staphylococcus species NOT DETECTED NOT DETECTED Final   Staphylococcus aureus (BCID) NOT DETECTED NOT DETECTED Final   Staphylococcus epidermidis NOT DETECTED NOT DETECTED Final   Staphylococcus lugdunensis NOT DETECTED NOT DETECTED Final   Streptococcus species NOT DETECTED NOT DETECTED Final   Streptococcus agalactiae NOT DETECTED NOT DETECTED Final   Streptococcus pneumoniae NOT DETECTED NOT DETECTED Final   Streptococcus pyogenes NOT DETECTED NOT DETECTED Final   A.calcoaceticus-baumannii NOT DETECTED NOT DETECTED Final   Bacteroides fragilis NOT DETECTED NOT DETECTED Final   Enterobacterales DETECTED (A) NOT DETECTED Final    Comment: Enterobacterales represent a large order of gram negative bacteria, not a single organism. CRITICAL RESULT CALLED TO, READ BACK BY AND VERIFIED WITH: S BROWN RN 12/03/2023 @ 0304 BY AB    Enterobacter  cloacae complex NOT DETECTED NOT DETECTED Final   Escherichia coli DETECTED (A) NOT DETECTED Final    Comment: CRITICAL RESULT CALLED TO, READ BACK BY AND VERIFIED WITH: S BROWN RN 12/03/2023 @ 0304 BY AB    Klebsiella aerogenes NOT DETECTED NOT DETECTED Final   Klebsiella oxytoca NOT DETECTED NOT DETECTED Final   Klebsiella pneumoniae NOT DETECTED NOT DETECTED Final   Proteus species NOT DETECTED NOT DETECTED Final   Salmonella species NOT DETECTED NOT DETECTED Final   Serratia marcescens NOT DETECTED NOT DETECTED Final   Haemophilus influenzae NOT DETECTED NOT DETECTED Final   Neisseria meningitidis NOT DETECTED NOT DETECTED Final   Pseudomonas aeruginosa NOT DETECTED NOT DETECTED Final   Stenotrophomonas maltophilia NOT DETECTED NOT DETECTED Final   Candida albicans NOT DETECTED NOT DETECTED Final   Candida auris NOT DETECTED NOT DETECTED Final   Candida glabrata NOT DETECTED NOT DETECTED Final   Candida krusei NOT DETECTED NOT DETECTED Final   Candida parapsilosis NOT DETECTED NOT DETECTED Final   Candida tropicalis NOT DETECTED NOT DETECTED Final   Cryptococcus neoformans/gattii NOT DETECTED NOT DETECTED Final   CTX-M ESBL NOT DETECTED NOT DETECTED Final   Carbapenem resistance IMP NOT DETECTED NOT DETECTED Final   Carbapenem resistance KPC NOT DETECTED NOT DETECTED Final   Carbapenem resistance NDM NOT DETECTED NOT DETECTED Final   Carbapenem resist OXA 48 LIKE NOT DETECTED NOT DETECTED Final   Carbapenem resistance VIM NOT DETECTED NOT DETECTED Final    Comment: Performed at [facility], [address] [place], [place] [address]  Urine Culture     Status: Abnormal (Preliminary result)   Collection Time: 12/02/23  9:44 AM   Specimen: Urine, Clean Catch  Result Value Ref Range Status   Specimen Description   Final    URINE, CLEAN CATCH Performed at [facility], [address] [place], [place] [address]    Special Requests   Final    NONE Performed at [facility], [address] [place], [place] [address]    Culture (A)  Final    >=100,000 COLONIES/mL ESCHERICHIA COLI SUSCEPTIBILITIES TO FOLLOW Performed at [facility], [address], [place], [place] [address]    Report Status PENDING  Incomplete  Urine Culture     Status: None   Collection Time: 12/02/23 12:59 PM   Specimen: Urine, Random  Result Value Ref Range Status   Specimen Description   Final    URINE, RANDOM Performed at [facility], [address] [place], [place] [address]    Special Requests   Final    NONE Reflexed from [id number] Performed at [facility], [address] [place], [place] [address]    Culture   Final    NO GROWTH Performed at [facility], [address], [place], [place] [address]    Report Status 12/04/2023 FINAL  Final         Radiology Studies: CT ABDOMEN PELVIS W CONTRAST Result Date: 12/02/2023 CLINICAL DATA:  Increased urinary frequency. Blood and urine leukocytosis. Clinical concern for pyelonephritis. EXAM: CT ABDOMEN AND PELVIS WITH CONTRAST TECHNIQUE: Multidetector CT imaging of the abdomen and pelvis was performed using the standard protocol following bolus administration of intravenous contrast. RADIATION DOSE REDUCTION: This exam was performed according to the departmental dose-optimization program which includes automated exposure control, adjustment of the mA and/or kV according to patient size and/or use of iterative reconstruction technique. CONTRAST:  OMNIPAQUE IOHEXOL 300 MG/ML  SOLN COMPARISON:  03/06/2007 FINDINGS: Lower chest: Enlarged heart. Prominent vasculature at the lung bases with no pulmonary edema or pleural fluid. Hepatobiliary: No focal liver abnormality is seen. No gallstones, gallbladder wall thickening, or biliary dilatation. Pancreas: Unremarkable. No pancreatic ductal dilatation or surrounding inflammatory changes. Spleen: Normal in size without focal abnormality. Adrenals/Urinary Tract: Unremarkable  adrenal glands. Multiple subcentimeter calculi in both kidneys. Small bilateral simple appearing renal cysts. These do not need imaging follow-up. 2.8 cm oval, heterogeneous, low and medium density area in the lower pole of the left kidney, not definitely seen previously without intravenous contrast. No bladder or ureteral calculi and no hydronephrosis. Mild diffuse bladder wall thickening. Mildly enlarged prostate gland with protrusion of the median lobe into the base of the urinary bladder. Stomach/Bowel: Multiple sigmoid colon diverticula without evidence of diverticulitis. Unremarkable stomach, small bowel and appendix. Vascular/Lymphatic: Atheromatous arterial calcifications without aneurysm. No enlarged lymph nodes. Reproductive: Mildly enlarged prostate gland. Other: Moderate-sized left inguinal hernia containing fat with some edematous changes in the herniated fat. Small umbilical and right inguinal hernias containing fat. Musculoskeletal: Lumbar and lower thoracic spine degenerative changes. Approximately 25% old L2 superior endplate compression deformity with Schmorl's node formation at the L2-3 level. Disc space narrowing and mild posterior spur formation at the L2-3 level with minimal grade 1 retrolisthesis. Facet degenerative changes with mild grade 1 anterolisthesis at the L4-5 level. IMPRESSION: 1. 2.8 cm oval, heterogeneous, low and medium density area in the lower pole of the left kidney. This could represent isolated pyelonephritis, complicated cyst or a solid mass. Recommend a follow-up CT or MRI without and with contrast in 1 month, following appropriate antibiotic therapy for urinary tract infection. 2. Bilateral nephrolithiasis. No ureteral or bladder calculi and no hydronephrosis. 3. Mild diffuse bladder wall thickening, likely due to chronic bladder outlet obstruction. 4. Mildly enlarged prostate gland with protrusion of the median lobe into the base of the urinary bladder. 5. Moderate-sized  left inguinal hernia containing fat with some edematous changes in the herniated fat. 6. Small umbilical and right inguinal hernias containing fat. 7. Sigmoid colon diverticulosis. 8. Cardiomegaly with pulmonary vascular congestion. Electronically Signed   By: Beckie Salts M.D.   On: 12/02/2023 15:47        Scheduled Meds:  amLODipine  10 mg Oral QHS   vitamin C  1,000 mg Oral Daily   aspirin EC  81 mg Oral Daily   atorvastatin  10 mg Oral Q M,W,F   cyanocobalamin  2,000 mcg Oral Daily   finasteride  5 mg Oral Daily   heparin  5,000 Units Subcutaneous Q8H   omega-3 acid ethyl esters  2 g Oral BID   pantoprazole  40 mg Oral Daily   propranolol  10 mg Oral BID   propranolol  20 mg Oral Q breakfast   senna-docusate  1 tablet Oral BID   tamsulosin  0.4 mg Oral Daily   Continuous Infusions:  cefTRIAXone (ROCEPHIN)  IV 2 g (12/03/23 1015)     LOS: 2 days    Time spent: 35 minutes    Crosley Stejskal Hoover Brunette, DO [facility]  If 7PM-7AM, please contact night-coverage [email or web address] 12/04/2023, 10:52 AM

## 2023-12-04 NOTE — Plan of Care (Signed)
  Problem: Education: Goal: Knowledge of General Education information will improve Description: Including pain rating scale, medication(s)/side effects and non-pharmacologic comfort measures Outcome: Progressing   Problem: Health Behavior/Discharge Planning: Goal: Ability to manage health-related needs will improve Outcome: Progressing   Problem: Clinical Measurements: Goal: Ability to maintain clinical measurements within normal limits will improve Outcome: Progressing Goal: Diagnostic test results will improve Outcome: Progressing Goal: Respiratory complications will improve Outcome: Progressing Goal: Cardiovascular complication will be avoided Outcome: Progressing   Problem: Nutrition: Goal: Adequate nutrition will be maintained Outcome: Progressing   Problem: Coping: Goal: Level of anxiety will decrease Outcome: Progressing   Problem: Elimination: Goal: Will not experience complications related to bowel motility Outcome: Progressing

## 2023-12-05 DIAGNOSIS — A419 Sepsis, unspecified organism: Secondary | ICD-10-CM | POA: Diagnosis not present

## 2023-12-05 DIAGNOSIS — R6520 Severe sepsis without septic shock: Secondary | ICD-10-CM | POA: Diagnosis not present

## 2023-12-05 DIAGNOSIS — N179 Acute kidney failure, unspecified: Secondary | ICD-10-CM | POA: Diagnosis not present

## 2023-12-05 LAB — BASIC METABOLIC PANEL WITH GFR
Anion gap: 11 (ref 5–15)
BUN: 14 mg/dL (ref 8–23)
CO2: 22 mmol/L (ref 22–32)
Calcium: 8.6 mg/dL — ABNORMAL LOW (ref 8.9–10.3)
Chloride: 102 mmol/L (ref 98–111)
Creatinine, Ser: 0.87 mg/dL (ref 0.61–1.24)
GFR, Estimated: >60 mL/min (ref >60)
Glucose, Bld: 108 mg/dL — ABNORMAL HIGH (ref 70–99)
Potassium: 3.4 mmol/L — ABNORMAL LOW (ref 3.5–5.1)
Sodium: 135 mmol/L (ref 135–145)

## 2023-12-05 LAB — CULTURE, BLOOD (ROUTINE X 2)
Special Requests: ADEQUATE
Special Requests: ADEQUATE

## 2023-12-05 LAB — CBC
HCT: 35.8 % — ABNORMAL LOW (ref 39.0–52.0)
Hemoglobin: 12 g/dL — ABNORMAL LOW (ref 13.0–17.0)
MCH: 30.1 pg (ref 26.0–34.0)
MCHC: 33.5 g/dL (ref 30.0–36.0)
MCV: 89.7 fL (ref 80.0–100.0)
Platelets: 263 10*3/uL (ref 150–400)
RBC: 3.99 MIL/uL — ABNORMAL LOW (ref 4.22–5.81)
RDW: 15 % (ref 11.5–15.5)
WBC: 7.1 10*3/uL (ref 4.0–10.5)
nRBC: 0 % (ref 0.0–0.2)

## 2023-12-05 LAB — MAGNESIUM: Magnesium: 2 mg/dL (ref 1.7–2.4)

## 2023-12-05 LAB — LYME DISEASE SEROLOGY W/REFLEX: Lyme Total Antibody EIA: NEGATIVE

## 2023-12-05 MED ORDER — ORAL CARE MOUTH RINSE: 15.0000 mL | OROMUCOSAL | Status: DC | PRN

## 2023-12-05 MED ORDER — POTASSIUM CHLORIDE CRYS ER 20 MEQ PO TBCR
40.0000 meq | EXTENDED_RELEASE_TABLET | Freq: Once | ORAL | Status: AC
  Administered 2023-12-05: 40 meq via ORAL
  Filled 2023-12-05: qty 2

## 2023-12-05 MED ORDER — LEVOFLOXACIN 750 MG PO TABS: 750.0000 mg | ORAL_TABLET | Freq: Every day | ORAL | 0 refills | Status: AC

## 2023-12-05 NOTE — Plan of Care (Signed)
  Problem: Education: Goal: Knowledge of General Education information will improve Description: Including pain rating scale, medication(s)/side effects and non-pharmacologic comfort measures Outcome: Progressing   Problem: Health Behavior/Discharge Planning: Goal: Ability to manage health-related needs will improve Outcome: Progressing   Problem: Clinical Measurements: Goal: Ability to maintain clinical measurements within normal limits will improve Outcome: Progressing   Problem: Pain Managment: Goal: General experience of comfort will improve and/or be controlled Outcome: Progressing   Problem: Skin Integrity: Goal: Risk for impaired skin integrity will decrease Outcome: Progressing

## 2023-12-05 NOTE — Discharge Summary (Signed)
 Physician Discharge Summary  Darius Grimes:[id number] DOB: [birth year] DOA: 12/02/2023  PCP: Benita Stabile, MD  Admit date: 12/02/2023  Discharge date: 12/05/2023  Admitted From:Home  Disposition:  Home  Recommendations for Outpatient Follow-up:  Follow up with PCP in 1-2 weeks Follow-up with urology outpatient for renal mass evaluation with referral sent Continue on Levaquin as prescribed for 5 more days to complete course of treatment for E. coli bacteremia/UTI Continue other home medications as prior  Home Health: None  Equipment/Devices: None  Discharge Condition:Stable  CODE STATUS: Full  Diet recommendation: Heart Healthy  Brief/Interim Summary: Darius Grimes is a 78 y.o. male with medical history significant for CAD with prior CABG, atrial fibrillation, hypertension, dyslipidemia, GERD, and BPH who presented to the ED with some fevers, nausea, vomiting, dizziness for over the last 2 weeks.  He had COVID testing done which was negative.  Patient was admitted for sepsis secondary to E. coli UTI with bacteremia along with AKI.  His AKI has now resolved and he may now transition to oral treatment with Levaquin to complete course of treatment.  No other acute events or concerns noted and he is now in stable condition for discharge.  He will need follow-up with urology as noted above her renal stone was noted incidentally on imaging.  Discharge Diagnoses:  Principal Problem:   Sepsis (HCC) Active Problems:   AF (paroxysmal atrial fibrillation) (HCC)   Acid reflux   Dyslipidemia   S/P coronary artery bypass graft x 3   Wears hearing aid  Principal discharge diagnosis: Sepsis secondary to E. coli UTI with associated bacteremia.  Mild AKI.  New renal mass incidentally noted.  Discharge Instructions  Discharge Instructions     Ambulatory referral to Urology   Complete by: As directed    Diet - low sodium heart healthy   Complete by: As directed    Increase activity  slowly   Complete by: As directed       Allergies as of 12/05/2023       Reactions   Alfuzosin Other (See Comments)   Pt is unaware of allergy    Pravastatin Other (See Comments)   Joint pain    Terazosin Other (See Comments)   Pt is unaware of allergy        Medication List     TAKE these medications    acetaminophen 650 MG CR tablet Commonly known as: TYLENOL Take 1,300 mg by mouth every 8 (eight) hours as needed for pain.   allopurinol 300 MG tablet Commonly known as: ZYLOPRIM Take 1 tablet (300 mg total) by mouth daily. What changed:  when to take this reasons to take this   amLODipine 10 MG tablet Commonly known as: NORVASC Take 10 mg by mouth at bedtime.   aspirin EC 81 MG tablet Take 81 mg by mouth daily. Swallow whole.   atorvastatin 10 MG tablet Commonly known as: LIPITOR Take 10 mg by mouth every Monday, Wednesday, and Friday.   cyanocobalamin 1000 MCG tablet Commonly known as: VITAMIN B12 Take 2,000 mcg by mouth daily.   D3 2000 PO Take 2,000 Units by mouth daily.   finasteride 5 MG tablet Commonly known as: PROSCAR Take 5 mg by mouth daily.   Fish Oil 1200 MG Caps Take 2 capsules (2,400 mg total) by mouth in the morning and at bedtime.   levofloxacin 750 MG tablet Commonly known as: Levaquin Take 1 tablet (750 mg total) by mouth daily for 5 days.  LORazepam 0.5 MG tablet Commonly known as: ATIVAN Take 0.5 mg by mouth at bedtime as needed for sleep.   losartan 100 MG tablet Commonly known as: COZAAR Take 100 mg by mouth daily. Take 1 daily   meloxicam 15 MG tablet Commonly known as: MOBIC Take 15 mg by mouth daily.   METAMUCIL FIBER PO Take 1 Scoop by mouth every other day.   multivitamin with minerals Tabs tablet Take 1 tablet by mouth daily.   pantoprazole 40 MG tablet Commonly known as: PROTONIX Take 40 mg by mouth daily.   potassium chloride SA 20 MEQ tablet Commonly known as: KLOR-CON M Take 20 mEq by mouth daily.    propranolol 10 MG tablet Commonly known as: INDERAL Take 10-20 mg by mouth See admin instructions. Take 20mg  (2 tablets) by mouth in the mornings, 10mg  (1 tablet) in the evening, and 10mg  (1 tablet) at night.   tamsulosin 0.4 MG Caps capsule Commonly known as: FLOMAX Take 0.4 mg by mouth daily.   torsemide 20 MG tablet Commonly known as: DEMADEX Take 20 mg by mouth See admin instructions. Take 20mg  (1 tablet) by mouth daily. On Tuesday's and Thursday's, take an extra 20mg  tablet.   vitamin C 1000 MG tablet Take 1,000 mg by mouth daily.        Follow-up Information     Benita Stabile, MD. Schedule an appointment as soon as possible for a visit in 1 week(s).   Specialty: Internal Medicine Contact information: [address] [place] [address] [phone]         [facility]. Go to.   Specialty: Urology Contact information: [address] [place] [address] [phone]               Allergies  Allergen Reactions   Alfuzosin Other (See Comments)    Pt is unaware of allergy    Pravastatin Other (See Comments)    Joint pain    Terazosin Other (See Comments)    Pt is unaware of allergy    Consultations: None   Procedures/Studies: CT ABDOMEN PELVIS W CONTRAST Result Date: 12/02/2023 CLINICAL DATA:  Increased urinary frequency. Blood and urine leukocytosis. Clinical concern for pyelonephritis. EXAM: CT ABDOMEN AND PELVIS WITH CONTRAST TECHNIQUE: Multidetector CT imaging of the abdomen and pelvis was performed using the standard protocol following bolus administration of intravenous contrast. RADIATION DOSE REDUCTION: This exam was performed according to the departmental dose-optimization program which includes automated exposure control, adjustment of the mA and/or kV according to patient size and/or use of iterative reconstruction technique. CONTRAST:  OMNIPAQUE IOHEXOL 300 MG/ML  SOLN COMPARISON:   03/06/2007 FINDINGS: Lower chest: Enlarged heart. Prominent vasculature at the lung bases with no pulmonary edema or pleural fluid. Hepatobiliary: No focal liver abnormality is seen. No gallstones, gallbladder wall thickening, or biliary dilatation. Pancreas: Unremarkable. No pancreatic ductal dilatation or surrounding inflammatory changes. Spleen: Normal in size without focal abnormality. Adrenals/Urinary Tract: Unremarkable adrenal glands. Multiple subcentimeter calculi in both kidneys. Small bilateral simple appearing renal cysts. These do not need imaging follow-up. 2.8 cm oval, heterogeneous, low and medium density area in the lower pole of the left kidney, not definitely seen previously without intravenous contrast. No bladder or ureteral calculi and no hydronephrosis. Mild diffuse bladder wall thickening. Mildly enlarged prostate gland with protrusion of the median lobe into the base of the urinary bladder. Stomach/Bowel: Multiple sigmoid colon diverticula without evidence of diverticulitis. Unremarkable stomach, small bowel and  appendix. Vascular/Lymphatic: Atheromatous arterial calcifications without aneurysm. No enlarged lymph nodes. Reproductive: Mildly enlarged prostate gland. Other: Moderate-sized left inguinal hernia containing fat with some edematous changes in the herniated fat. Small umbilical and right inguinal hernias containing fat. Musculoskeletal: Lumbar and lower thoracic spine degenerative changes. Approximately 25% old L2 superior endplate compression deformity with Schmorl's node formation at the L2-3 level. Disc space narrowing and mild posterior spur formation at the L2-3 level with minimal grade 1 retrolisthesis. Facet degenerative changes with mild grade 1 anterolisthesis at the L4-5 level. IMPRESSION: 1. 2.8 cm oval, heterogeneous, low and medium density area in the lower pole of the left kidney. This could represent isolated pyelonephritis, complicated cyst or a solid mass. Recommend  a follow-up CT or MRI without and with contrast in 1 month, following appropriate antibiotic therapy for urinary tract infection. 2. Bilateral nephrolithiasis. No ureteral or bladder calculi and no hydronephrosis. 3. Mild diffuse bladder wall thickening, likely due to chronic bladder outlet obstruction. 4. Mildly enlarged prostate gland with protrusion of the median lobe into the base of the urinary bladder. 5. Moderate-sized left inguinal hernia containing fat with some edematous changes in the herniated fat. 6. Small umbilical and right inguinal hernias containing fat. 7. Sigmoid colon diverticulosis. 8. Cardiomegaly with pulmonary vascular congestion. Electronically Signed   By: Beckie Salts M.D.   On: 12/02/2023 15:47   DG Sinuses Complete Result Date: 12/02/2023 CLINICAL DATA:  Headache, dizziness, fever. EXAM: PARANASAL SINUSES - COMPLETE 3 + VIEW COMPARISON:  None Available. FINDINGS: The paranasal sinus are aerated. There is no evidence of sinus opacification air-fluid levels or mucosal thickening. No significant bone abnormalities are seen. IMPRESSION: Negative. Electronically Signed   By: Lupita Raider M.D.   On: 12/02/2023 08:55   DG Chest 2 View Result Date: 12/02/2023 CLINICAL DATA:  Fever. EXAM: CHEST - 2 VIEW COMPARISON:  10/15/2016 FINDINGS: The cardio pericardial silhouette is enlarged. The lungs are clear without focal pneumonia, edema, pneumothorax or pleural effusion. No acute bony abnormality. Telemetry leads overlie the chest. Status post CABG. IMPRESSION: Enlargement of the cardiopericardial silhouette without acute cardiopulmonary findings. Electronically Signed   By: Kennith Center M.D.   On: 12/02/2023 08:53     Discharge Exam: Vitals:   12/04/23 1956 12/05/23 0316  BP: 123/69 (!) 136/93  Pulse: 97 86  Resp:    Temp: 99.1 F (37.3 C) 99.2 F (37.3 C)  SpO2: 94% 92%   Vitals:   12/04/23 0541 12/04/23 1342 12/04/23 1956 12/05/23 0316  BP: 127/77 130/83 123/69 (!) 136/93   Pulse: 100 99 97 86  Resp:      Temp: 98.7 F (37.1 C) 97.7 F (36.5 C) 99.1 F (37.3 C) 99.2 F (37.3 C)  TempSrc: Oral Oral Oral Oral  SpO2: 95% 93% 94% 92%  Weight:      Height:        General: Pt is alert, awake, not in acute distress Cardiovascular: RRR, S1/S2 +, no rubs, no gallops Respiratory: CTA bilaterally, no wheezing, no rhonchi Abdominal: Soft, NT, ND, bowel sounds + Extremities: no edema, no cyanosis    The results of significant diagnostics from this hospitalization (including imaging, microbiology, ancillary and laboratory) are listed below for reference.     Microbiology: Recent Results (from the past 240 hours)  Resp panel by RT-PCR (RSV, Flu A&B, Covid) Anterior Nasal Swab     Status: None   Collection Time: 12/02/23  8:14 AM   Specimen: Anterior Nasal Swab  Result Value  Ref Range Status   SARS Coronavirus 2 by RT PCR NEGATIVE NEGATIVE Final    Comment: (NOTE) SARS-CoV-2 target nucleic acids are NOT DETECTED.  The SARS-CoV-2 RNA is generally detectable in upper respiratory specimens during the acute phase of infection. The lowest concentration of SARS-CoV-2 viral copies this assay can detect is 138 copies/mL. A negative result does not preclude SARS-Cov-2 infection and should not be used as the sole basis for treatment or other patient management decisions. A negative result may occur with  improper specimen collection/handling, submission of specimen other than nasopharyngeal swab, presence of viral mutation(s) within the areas targeted by this assay, and inadequate number of viral copies(<138 copies/mL). A negative result must be combined with clinical observations, patient history, and epidemiological information. The expected result is Negative.  Fact Sheet for Patients:  BloggerCourse.com  Fact Sheet for Healthcare Providers:  SeriousBroker.it  This test is no t yet approved or cleared by  the [place] FDA and  has been authorized for detection and/or diagnosis of SARS-CoV-2 by FDA under an Emergency Use Authorization (EUA). This EUA will remain  in effect (meaning this test can be used) for the duration of the COVID-19 declaration under Section 564(b)(1) of the Act, 21 U.S.C.section 360bbb-3(b)(1), unless the authorization is terminated  or revoked sooner.       Influenza A by PCR NEGATIVE NEGATIVE Final   Influenza B by PCR NEGATIVE NEGATIVE Final    Comment: (NOTE) The Xpert Xpress SARS-CoV-2/FLU/RSV plus assay is intended as an aid in the diagnosis of influenza from Nasopharyngeal swab specimens and should not be used as a sole basis for treatment. Nasal washings and aspirates are unacceptable for Xpert Xpress SARS-CoV-2/FLU/RSV testing.  Fact Sheet for Patients: BloggerCourse.com  Fact Sheet for Healthcare Providers: SeriousBroker.it  This test is not yet approved or cleared by the [place] FDA and has been authorized for detection and/or diagnosis of SARS-CoV-2 by FDA under an Emergency Use Authorization (EUA). This EUA will remain in effect (meaning this test can be used) for the duration of the COVID-19 declaration under Section 564(b)(1) of the Act, 21 U.S.C. section 360bbb-3(b)(1), unless the authorization is terminated or revoked.     Resp Syncytial Virus by PCR NEGATIVE NEGATIVE Final    Comment: (NOTE) Fact Sheet for Patients: BloggerCourse.com  Fact Sheet for Healthcare Providers: SeriousBroker.it  This test is not yet approved or cleared by the [place] FDA and has been authorized for detection and/or diagnosis of SARS-CoV-2 by FDA under an Emergency Use Authorization (EUA). This EUA will remain in effect (meaning this test can be used) for the duration of the COVID-19 declaration under Section 564(b)(1) of the Act, 21  U.S.C. section 360bbb-3(b)(1), unless the authorization is terminated or revoked.  Performed at [facility], [address] [place], [place] [address]   Blood culture (routine x 2)     Status: Abnormal   Collection Time: 12/02/23  9:08 AM   Specimen: BLOOD  Result Value Ref Range Status   Specimen Description   Final    BLOOD BLOOD RIGHT ARM Performed at [facility], [address] [place], [place] [address]    Special Requests   Final    BOTTLES DRAWN AEROBIC AND ANAEROBIC Blood Culture adequate volume Performed at [facility], [address] [place], [place] [address]    Culture  Setup Time   Final    GRAM NEGATIVE RODS ANAEROBIC BOTTLE ONLY CRITICAL VALUE NOTED.  VALUE IS CONSISTENT WITH PREVIOUSLY REPORTED AND  CALLED VALUE.    Culture (A)  Final    ESCHERICHIA COLI Gram Stain Report Called to,Read Back By and Verified With: TINA E. ON 12/02/2023 @8 :33PM BY T.HAMER  SUSCEPTIBILITIES PERFORMED ON PREVIOUS CULTURE WITHIN THE LAST 5 DAYS. Performed at [facility], [address] [place], [place] [address]    Report Status 12/05/2023 FINAL  Final  Blood culture (routine x 2)     Status: Abnormal   Collection Time: 12/02/23  9:09 AM   Specimen: BLOOD  Result Value Ref Range Status   Specimen Description   Final    BLOOD BLOOD RIGHT HAND Performed at [facility], [address], [place], [place] [address]    Special Requests   Final    BOTTLES DRAWN AEROBIC AND ANAEROBIC Blood Culture adequate volume Performed at [facility], [address] [place], [place] [address]    Culture  Setup Time (A)  Final    GRAM VARIABLE ROD ANAEROBIC BOTTLE ONLY CRITICAL RESULT CALLED TO, READ BACK BY AND VERIFIED WITH: BROWN,S ON 12/02/23 A 2202 BY PURDIE,J GRAM NEGATIVE RODS AEROBIC BOTTLE ONLY CRITICAL VALUE NOTED.  VALUE IS CONSISTENT WITH PREVIOUSLY REPORTED AND CALLED VALUE. CRITICAL RESULT CALLED TO, READ BACK BY AND VERIFIED WITH: Theora Gianotti RN 12/03/2023 @ 0304 BY AB Performed  at [facility], [address], [place], [place] [address]    Culture ESCHERICHIA COLI (A)  Final   Report Status 12/05/2023 FINAL  Final   Organism ID, Bacteria ESCHERICHIA COLI  Final   Organism ID, Bacteria ESCHERICHIA COLI  Final      Susceptibility   Escherichia coli - KIRBY BAUER*    CEFAZOLIN INTERMEDIATE Intermediate    Escherichia coli - MIC*    AMPICILLIN >=32 RESISTANT Resistant     CEFEPIME <=0.12 SENSITIVE Sensitive     CEFTAZIDIME <=1 SENSITIVE Sensitive     CEFTRIAXONE <=0.25 SENSITIVE Sensitive     CIPROFLOXACIN <=0.25 SENSITIVE Sensitive     GENTAMICIN <=1 SENSITIVE Sensitive     IMIPENEM <=0.25 SENSITIVE Sensitive     TRIMETH/SULFA <=20 SENSITIVE Sensitive     AMPICILLIN/SULBACTAM 16 INTERMEDIATE Intermediate     PIP/TAZO <=4 SENSITIVE Sensitive ug/mL    * ESCHERICHIA COLI    ESCHERICHIA COLI  Blood Culture ID Panel (Reflexed)     Status: Abnormal   Collection Time: 12/02/23  9:09 AM  Result Value Ref Range Status   Enterococcus faecalis NOT DETECTED NOT DETECTED Final   Enterococcus Faecium NOT DETECTED NOT DETECTED Final   Listeria monocytogenes NOT DETECTED NOT DETECTED Final   Staphylococcus species NOT DETECTED NOT DETECTED Final   Staphylococcus aureus (BCID) NOT DETECTED NOT DETECTED Final   Staphylococcus epidermidis NOT DETECTED NOT DETECTED Final   Staphylococcus lugdunensis NOT DETECTED NOT DETECTED Final   Streptococcus species NOT DETECTED NOT DETECTED Final   Streptococcus agalactiae NOT DETECTED NOT DETECTED Final   Streptococcus pneumoniae NOT DETECTED NOT DETECTED Final   Streptococcus pyogenes NOT DETECTED NOT DETECTED Final   A.calcoaceticus-baumannii NOT DETECTED NOT DETECTED Final   Bacteroides fragilis NOT DETECTED NOT DETECTED Final   Enterobacterales DETECTED (A) NOT DETECTED Final    Comment: Enterobacterales represent a large order of gram negative bacteria, not a single organism. CRITICAL RESULT CALLED TO, READ BACK BY AND  VERIFIED WITH: S BROWN RN 12/03/2023 @ 0304 BY AB    Enterobacter cloacae complex NOT DETECTED NOT DETECTED Final   Escherichia coli DETECTED (A) NOT DETECTED Final    Comment:  CRITICAL RESULT CALLED TO, READ BACK BY AND VERIFIED WITH: S BROWN RN 12/03/2023 @ 0304 BY AB    Klebsiella aerogenes NOT DETECTED NOT DETECTED Final   Klebsiella oxytoca NOT DETECTED NOT DETECTED Final   Klebsiella pneumoniae NOT DETECTED NOT DETECTED Final   Proteus species NOT DETECTED NOT DETECTED Final   Salmonella species NOT DETECTED NOT DETECTED Final   Serratia marcescens NOT DETECTED NOT DETECTED Final   Haemophilus influenzae NOT DETECTED NOT DETECTED Final   Neisseria meningitidis NOT DETECTED NOT DETECTED Final   Pseudomonas aeruginosa NOT DETECTED NOT DETECTED Final   Stenotrophomonas maltophilia NOT DETECTED NOT DETECTED Final   Candida albicans NOT DETECTED NOT DETECTED Final   Candida auris NOT DETECTED NOT DETECTED Final   Candida glabrata NOT DETECTED NOT DETECTED Final   Candida krusei NOT DETECTED NOT DETECTED Final   Candida parapsilosis NOT DETECTED NOT DETECTED Final   Candida tropicalis NOT DETECTED NOT DETECTED Final   Cryptococcus neoformans/gattii NOT DETECTED NOT DETECTED Final   CTX-M ESBL NOT DETECTED NOT DETECTED Final   Carbapenem resistance IMP NOT DETECTED NOT DETECTED Final   Carbapenem resistance KPC NOT DETECTED NOT DETECTED Final   Carbapenem resistance NDM NOT DETECTED NOT DETECTED Final   Carbapenem resist OXA 48 LIKE NOT DETECTED NOT DETECTED Final   Carbapenem resistance VIM NOT DETECTED NOT DETECTED Final    Comment: Performed at [facility], [address] [place], [place] [address]  Urine Culture     Status: Abnormal   Collection Time: 12/02/23  9:44 AM   Specimen: Urine, Clean Catch  Result Value Ref Range Status   Specimen Description   Final    URINE, CLEAN CATCH Performed at [facility], [address] [place], [place] [address]    Special Requests    Final    NONE Performed at [facility], [address] [place], [place] [address]    Culture >=100,000 COLONIES/mL ESCHERICHIA COLI (A)  Final   Report Status 12/04/2023 FINAL  Final   Organism ID, Bacteria ESCHERICHIA COLI (A)  Final      Susceptibility   Escherichia coli - MIC*    AMPICILLIN >=32 RESISTANT Resistant     CEFAZOLIN <=4 SENSITIVE Sensitive     CEFEPIME <=0.12 SENSITIVE Sensitive     CEFTRIAXONE <=0.25 SENSITIVE Sensitive     CIPROFLOXACIN <=0.25 SENSITIVE Sensitive     GENTAMICIN <=1 SENSITIVE Sensitive     IMIPENEM <=0.25 SENSITIVE Sensitive     NITROFURANTOIN <=16 SENSITIVE Sensitive     TRIMETH/SULFA <=20 SENSITIVE Sensitive     AMPICILLIN/SULBACTAM 16 INTERMEDIATE Intermediate     PIP/TAZO <=4 SENSITIVE Sensitive ug/mL    * >=100,000 COLONIES/mL ESCHERICHIA COLI  Urine Culture     Status: None   Collection Time: 12/02/23 12:59 PM   Specimen: Urine, Random  Result Value Ref Range Status   Specimen Description   Final    URINE, RANDOM Performed at [facility], [address] [place], [place] [address]    Special Requests   Final    NONE Reflexed from [id number] Performed at [facility], [address], [place], [place] [address]    Culture   Final    NO GROWTH Performed at [facility], [address], [place], [place] [address]    Report Status 12/04/2023 FINAL  Final     Labs: BNP (last 3 results) No results for input(s): "BNP" in the last 8760 hours. Basic Metabolic Panel: Recent Labs  Lab  12/02/23 0749 12/03/23 0355 12/04/23 0352 12/05/23 0455  NA 134* 136 134* 135  K 3.6 3.4* 3.7 3.4*  CL 99 104 102 102  CO2 20* 23 22 22   GLUCOSE 176* 110* 100* 108*  BUN 14 14 13 14   CREATININE 1.26* 1.01 0.92 0.87  CALCIUM 9.1 8.3* 8.3* 8.6*  MG  --  1.6* 2.0 2.0   Liver Function Tests: Recent Labs  Lab 12/02/23 0749  AST 28  ALT 17  ALKPHOS 62  BILITOT 0.7  PROT 7.1  ALBUMIN 3.1*   No results for input(s): "LIPASE", "AMYLASE" in the  last 168 hours. No results for input(s): "AMMONIA" in the last 168 hours. CBC: Recent Labs  Lab 12/02/23 0749 12/03/23 0355 12/04/23 0352 12/05/23 0455  WBC 16.8* 11.1* 8.7 7.1  NEUTROABS 15.9*  --   --   --   HGB 14.3 12.8* 12.3* 12.0*  HCT 43.7 38.7* 37.7* 35.8*  MCV 91.4 91.7 91.7 89.7  PLT 325 250 226 263   Cardiac Enzymes: No results for input(s): "CKTOTAL", "CKMB", "CKMBINDEX", "TROPONINI" in the last 168 hours. BNP: Invalid input(s): "POCBNP" CBG: No results for input(s): "GLUCAP" in the last 168 hours. D-Dimer No results for input(s): "DDIMER" in the last 72 hours. Hgb A1c No results for input(s): "HGBA1C" in the last 72 hours. Lipid Profile No results for input(s): "CHOL", "HDL", "LDLCALC", "TRIG", "CHOLHDL", "LDLDIRECT" in the last 72 hours. Thyroid function studies No results for input(s): "TSH", "T4TOTAL", "T3FREE", "THYROIDAB" in the last 72 hours.  Invalid input(s): "FREET3" Anemia work up No results for input(s): "VITAMINB12", "FOLATE", "FERRITIN", "TIBC", "IRON", "RETICCTPCT" in the last 72 hours. Urinalysis    Component Value Date/Time   COLORURINE YELLOW 12/02/2023 1259   APPEARANCEUR HAZY (A) 12/02/2023 1259   APPEARANCEUR Clear 10/07/2023 1054   LABSPEC 1.025 12/02/2023 1259   PHURINE 6.0 12/02/2023 1259   GLUCOSEU NEGATIVE 12/02/2023 1259   HGBUR MODERATE (A) 12/02/2023 1259   BILIRUBINUR NEGATIVE 12/02/2023 1259   BILIRUBINUR Negative 10/07/2023 1054   KETONESUR NEGATIVE 12/02/2023 1259   PROTEINUR 30 (A) 12/02/2023 1259   UROBILINOGEN 0.2 04/18/2021 0830   NITRITE NEGATIVE 12/02/2023 1259   LEUKOCYTESUR MODERATE (A) 12/02/2023 1259   Sepsis Labs Recent Labs  Lab 12/02/23 0749 12/03/23 0355 12/04/23 0352 12/05/23 0455  WBC 16.8* 11.1* 8.7 7.1   Microbiology Recent Results (from the past 240 hours)  Resp panel by RT-PCR (RSV, Flu A&B, Covid) Anterior Nasal Swab     Status: None   Collection Time: 12/02/23  8:14 AM   Specimen:  Anterior Nasal Swab  Result Value Ref Range Status   SARS Coronavirus 2 by RT PCR NEGATIVE NEGATIVE Final    Comment: (NOTE) SARS-CoV-2 target nucleic acids are NOT DETECTED.  The SARS-CoV-2 RNA is generally detectable in upper respiratory specimens during the acute phase of infection. The lowest concentration of SARS-CoV-2 viral copies this assay can detect is 138 copies/mL. A negative result does not preclude SARS-Cov-2 infection and should not be used as the sole basis for treatment or other patient management decisions. A negative result may occur with  improper specimen collection/handling, submission of specimen other than nasopharyngeal swab, presence of viral mutation(s) within the areas targeted by this assay, and inadequate number of viral copies(<138 copies/mL). A negative result must be combined with clinical observations, patient history, and epidemiological information. The expected result is Negative.  Fact Sheet for Patients:  BloggerCourse.com  Fact Sheet for Healthcare Providers:  SeriousBroker.it  This test is no  t yet approved or cleared by the [place] and  has been authorized for detection and/or diagnosis of SARS-CoV-2 by FDA under an Emergency Use Authorization (EUA). This EUA will remain  in effect (meaning this test can be used) for the duration of the COVID-19 declaration under Section 564(b)(1) of the Act, 21 U.S.C.section 360bbb-3(b)(1), unless the authorization is terminated  or revoked sooner.       Influenza A by PCR NEGATIVE NEGATIVE Final   Influenza B by PCR NEGATIVE NEGATIVE Final    Comment: (NOTE) The Xpert Xpress SARS-CoV-2/FLU/RSV plus assay is intended as an aid in the diagnosis of influenza from Nasopharyngeal swab specimens and should not be used as a sole basis for treatment. Nasal washings and aspirates are unacceptable for Xpert Xpress SARS-CoV-2/FLU/RSV testing.  Fact  Sheet for Patients: BloggerCourse.com  Fact Sheet for Healthcare Providers: SeriousBroker.it  This test is not yet approved or cleared by the [place] FDA and has been authorized for detection and/or diagnosis of SARS-CoV-2 by FDA under an Emergency Use Authorization (EUA). This EUA will remain in effect (meaning this test can be used) for the duration of the COVID-19 declaration under Section 564(b)(1) of the Act, 21 U.S.C. section 360bbb-3(b)(1), unless the authorization is terminated or revoked.     Resp Syncytial Virus by PCR NEGATIVE NEGATIVE Final    Comment: (NOTE) Fact Sheet for Patients: BloggerCourse.com  Fact Sheet for Healthcare Providers: SeriousBroker.it  This test is not yet approved or cleared by the [place] FDA and has been authorized for detection and/or diagnosis of SARS-CoV-2 by FDA under an Emergency Use Authorization (EUA). This EUA will remain in effect (meaning this test can be used) for the duration of the COVID-19 declaration under Section 564(b)(1) of the Act, 21 U.S.C. section 360bbb-3(b)(1), unless the authorization is terminated or revoked.  Performed at [facility], [address] [place], [place] [address]   Blood culture (routine x 2)     Status: Abnormal   Collection Time: 12/02/23  9:08 AM   Specimen: BLOOD  Result Value Ref Range Status   Specimen Description   Final    BLOOD BLOOD RIGHT ARM Performed at [facility], [address], [place], [place] [address]    Special Requests   Final    BOTTLES DRAWN AEROBIC AND ANAEROBIC Blood Culture adequate volume Performed at [facility], [address] [place], [place] [address]    Culture  Setup Time   Final    GRAM NEGATIVE RODS ANAEROBIC BOTTLE ONLY CRITICAL VALUE NOTED.  VALUE IS CONSISTENT WITH PREVIOUSLY REPORTED AND CALLED VALUE.    Culture (A)  Final    ESCHERICHIA  COLI Gram Stain Report Called to,Read Back By and Verified With: TINA E. ON 12/02/2023 @8 :33PM BY T.HAMER  SUSCEPTIBILITIES PERFORMED ON PREVIOUS CULTURE WITHIN THE LAST 5 DAYS. Performed at [facility], [address], [place], [place] [address]    Report Status 12/05/2023 FINAL  Final  Blood culture (routine x 2)     Status: Abnormal   Collection Time: 12/02/23  9:09 AM   Specimen: BLOOD  Result Value Ref Range Status   Specimen Description   Final    BLOOD BLOOD RIGHT HAND Performed at [facility], [address], [place], [place] [address]    Special Requests   Final    BOTTLES DRAWN AEROBIC AND ANAEROBIC Blood Culture adequate volume Performed at [facility], [address], [place], [place] [address]    Culture  Setup Time (A)  Final    GRAM VARIABLE ROD ANAEROBIC BOTTLE ONLY CRITICAL RESULT CALLED TO, READ BACK BY AND VERIFIED WITH: BROWN,S ON 12/02/23 A 2202 BY PURDIE,J GRAM NEGATIVE RODS AEROBIC BOTTLE ONLY CRITICAL VALUE NOTED.  VALUE IS CONSISTENT WITH PREVIOUSLY REPORTED AND CALLED VALUE. CRITICAL RESULT CALLED TO, READ BACK BY AND VERIFIED WITH: Theora Gianotti RN 12/03/2023 @ 0304 BY AB Performed at [facility], [address] [place], [place] [address]    Culture ESCHERICHIA COLI (A)  Final   Report Status 12/05/2023 FINAL  Final   Organism ID, Bacteria ESCHERICHIA COLI  Final   Organism ID, Bacteria ESCHERICHIA COLI  Final      Susceptibility   Escherichia coli - KIRBY BAUER*    CEFAZOLIN INTERMEDIATE Intermediate    Escherichia coli - MIC*    AMPICILLIN >=32 RESISTANT Resistant     CEFEPIME <=0.12 SENSITIVE Sensitive     CEFTAZIDIME <=1 SENSITIVE Sensitive     CEFTRIAXONE <=0.25 SENSITIVE Sensitive     CIPROFLOXACIN <=0.25 SENSITIVE Sensitive     GENTAMICIN <=1 SENSITIVE Sensitive     IMIPENEM <=0.25 SENSITIVE Sensitive     TRIMETH/SULFA <=20 SENSITIVE Sensitive     AMPICILLIN/SULBACTAM 16 INTERMEDIATE Intermediate     PIP/TAZO <=4 SENSITIVE Sensitive  ug/mL    * ESCHERICHIA COLI    ESCHERICHIA COLI  Blood Culture ID Panel (Reflexed)     Status: Abnormal   Collection Time: 12/02/23  9:09 AM  Result Value Ref Range Status   Enterococcus faecalis NOT DETECTED NOT DETECTED Final   Enterococcus Faecium NOT DETECTED NOT DETECTED Final   Listeria monocytogenes NOT DETECTED NOT DETECTED Final   Staphylococcus species NOT DETECTED NOT DETECTED Final   Staphylococcus aureus (BCID) NOT DETECTED NOT DETECTED Final   Staphylococcus epidermidis NOT DETECTED NOT DETECTED Final   Staphylococcus lugdunensis NOT DETECTED NOT DETECTED Final   Streptococcus species NOT DETECTED NOT DETECTED Final   Streptococcus agalactiae NOT DETECTED NOT DETECTED Final   Streptococcus pneumoniae NOT DETECTED NOT DETECTED Final   Streptococcus pyogenes NOT DETECTED NOT DETECTED Final   A.calcoaceticus-baumannii NOT DETECTED NOT DETECTED Final   Bacteroides fragilis NOT DETECTED NOT DETECTED Final   Enterobacterales DETECTED (A) NOT DETECTED Final    Comment: Enterobacterales represent a large order of gram negative bacteria, not a single organism. CRITICAL RESULT CALLED TO, READ BACK BY AND VERIFIED WITH: S BROWN RN 12/03/2023 @ 0304 BY AB    Enterobacter cloacae complex NOT DETECTED NOT DETECTED Final   Escherichia coli DETECTED (A) NOT DETECTED Final    Comment: CRITICAL RESULT CALLED TO, READ BACK BY AND VERIFIED WITH: S BROWN RN 12/03/2023 @ 0304 BY AB    Klebsiella aerogenes NOT DETECTED NOT DETECTED Final   Klebsiella oxytoca NOT DETECTED NOT DETECTED Final   Klebsiella pneumoniae NOT DETECTED NOT DETECTED Final   Proteus species NOT DETECTED NOT DETECTED Final   Salmonella species NOT DETECTED NOT DETECTED Final   Serratia marcescens NOT DETECTED NOT DETECTED Final   Haemophilus influenzae NOT DETECTED NOT DETECTED Final   Neisseria meningitidis NOT DETECTED NOT DETECTED Final   Pseudomonas aeruginosa NOT DETECTED NOT DETECTED Final   Stenotrophomonas  maltophilia NOT DETECTED NOT DETECTED Final   Candida albicans NOT DETECTED NOT DETECTED Final   Candida auris NOT DETECTED NOT DETECTED Final   Candida glabrata NOT DETECTED NOT DETECTED Final   Candida krusei NOT DETECTED NOT DETECTED Final   Candida parapsilosis NOT DETECTED NOT DETECTED Final  Candida tropicalis NOT DETECTED NOT DETECTED Final   Cryptococcus neoformans/gattii NOT DETECTED NOT DETECTED Final   CTX-M ESBL NOT DETECTED NOT DETECTED Final   Carbapenem resistance IMP NOT DETECTED NOT DETECTED Final   Carbapenem resistance KPC NOT DETECTED NOT DETECTED Final   Carbapenem resistance NDM NOT DETECTED NOT DETECTED Final   Carbapenem resist OXA 48 LIKE NOT DETECTED NOT DETECTED Final   Carbapenem resistance VIM NOT DETECTED NOT DETECTED Final    Comment: Performed at [facility], [address] [place], [place] [address]  Urine Culture     Status: Abnormal   Collection Time: 12/02/23  9:44 AM   Specimen: Urine, Clean Catch  Result Value Ref Range Status   Specimen Description   Final    URINE, CLEAN CATCH Performed at [facility], [address] [place], [place] [address]    Special Requests   Final    NONE Performed at [facility], [address] [place], [place] [address]    Culture >=100,000 COLONIES/mL ESCHERICHIA COLI (A)  Final   Report Status 12/04/2023 FINAL  Final   Organism ID, Bacteria ESCHERICHIA COLI (A)  Final      Susceptibility   Escherichia coli - MIC*    AMPICILLIN >=32 RESISTANT Resistant     CEFAZOLIN <=4 SENSITIVE Sensitive     CEFEPIME <=0.12 SENSITIVE Sensitive     CEFTRIAXONE <=0.25 SENSITIVE Sensitive     CIPROFLOXACIN <=0.25 SENSITIVE Sensitive     GENTAMICIN <=1 SENSITIVE Sensitive     IMIPENEM <=0.25 SENSITIVE Sensitive     NITROFURANTOIN <=16 SENSITIVE Sensitive     TRIMETH/SULFA <=20 SENSITIVE Sensitive     AMPICILLIN/SULBACTAM 16 INTERMEDIATE Intermediate     PIP/TAZO <=4 SENSITIVE Sensitive ug/mL    * >=100,000  COLONIES/mL ESCHERICHIA COLI  Urine Culture     Status: None   Collection Time: 12/02/23 12:59 PM   Specimen: Urine, Random  Result Value Ref Range Status   Specimen Description   Final    URINE, RANDOM Performed at [facility], [address], [place], [place] [address]    Special Requests   Final    NONE Reflexed from [id number] Performed at [facility], [address] [place], [place] [address]    Culture   Final    NO GROWTH Performed at [facility], [address] [place], [place] [address]    Report Status 12/04/2023 FINAL  Final     Time coordinating discharge: 35 minutes  SIGNED:   Erick Blinks, DO [facility] 12/05/2023, 9:32 AM  If 7PM-7AM, please contact night-coverage [email or web address]

## 2023-12-06 NOTE — Transitions of Care (Post Inpatient/ED Visit) (Signed)
   12/06/2023  Name: Darius Grimes MRN: [id number] DOB: 1945-11-20  Today's TOC FU Call Status:   Patient chart review for Transition of Care   Zanylah Hardie Idelle Jo RN, MSN [facility]  [facility] RN Care Manager Direct Dial: [phone]  Fax: [phone] Website: [email or web address]

## 2023-12-19 ENCOUNTER — Other Ambulatory Visit: Payer: Self-pay

## 2023-12-19 ENCOUNTER — Encounter: Payer: Self-pay | Admitting: Urology

## 2023-12-19 ENCOUNTER — Ambulatory Visit: Admitting: Urology

## 2023-12-19 VITALS — BP 148/82 | HR 79

## 2023-12-19 DIAGNOSIS — N200 Calculus of kidney: Secondary | ICD-10-CM | POA: Diagnosis not present

## 2023-12-19 DIAGNOSIS — D49519 Neoplasm of unspecified behavior of unspecified kidney: Secondary | ICD-10-CM | POA: Diagnosis not present

## 2023-12-19 DIAGNOSIS — R3129 Other microscopic hematuria: Secondary | ICD-10-CM | POA: Diagnosis not present
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
## 2023-12-20 ENCOUNTER — Ambulatory Visit (HOSPITAL_COMMUNITY)
Admission: RE | Admit: 2023-12-20 | Discharge: 2023-12-20 | Disposition: A | Discharge status: Home / Self Care | Source: Ambulatory Visit | Attending: Urology | Admitting: Urology

## 2023-12-20 DIAGNOSIS — K4090 Unilateral inguinal hernia, without obstruction or gangrene, not specified as recurrent: Secondary | ICD-10-CM | POA: Diagnosis not present

## 2023-12-20 DIAGNOSIS — K5730 Diverticulosis of large intestine without perforation or abscess without bleeding: Secondary | ICD-10-CM | POA: Diagnosis not present

## 2023-12-20 DIAGNOSIS — N200 Calculus of kidney: Secondary | ICD-10-CM | POA: Insufficient documentation

## 2023-12-20 DIAGNOSIS — N400 Enlarged prostate without lower urinary tract symptoms: Secondary | ICD-10-CM | POA: Diagnosis not present

## 2023-12-20 MED ORDER — IOHEXOL 300 MG/ML  SOLN
125.0000 mL | Freq: Once | INTRAMUSCULAR | Status: AC | PRN
  Administered 2023-12-20: 125 mL via INTRAVENOUS

## 2023-12-26 DIAGNOSIS — E538 Deficiency of other specified B group vitamins: Secondary | ICD-10-CM | POA: Diagnosis not present

## 2023-12-26 DIAGNOSIS — E559 Vitamin D deficiency, unspecified: Secondary | ICD-10-CM | POA: Diagnosis not present

## 2023-12-26 DIAGNOSIS — E1169 Type 2 diabetes mellitus with other specified complication: Secondary | ICD-10-CM | POA: Diagnosis not present

## 2023-12-26 DIAGNOSIS — E782 Mixed hyperlipidemia: Secondary | ICD-10-CM | POA: Diagnosis not present

## 2023-12-28 DIAGNOSIS — N5089 Other specified disorders of the male genital organs: Secondary | ICD-10-CM | POA: Diagnosis not present

## 2023-12-28 DIAGNOSIS — I480 Paroxysmal atrial fibrillation: Secondary | ICD-10-CM | POA: Diagnosis not present

## 2023-12-28 DIAGNOSIS — N200 Calculus of kidney: Secondary | ICD-10-CM | POA: Diagnosis not present

## 2023-12-28 DIAGNOSIS — A419 Sepsis, unspecified organism: Secondary | ICD-10-CM | POA: Diagnosis not present

## 2023-12-28 DIAGNOSIS — I129 Hypertensive chronic kidney disease with stage 1 through stage 4 chronic kidney disease, or unspecified chronic kidney disease: Secondary | ICD-10-CM | POA: Diagnosis not present

## 2023-12-28 DIAGNOSIS — N390 Urinary tract infection, site not specified: Secondary | ICD-10-CM | POA: Diagnosis not present

## 2023-12-28 DIAGNOSIS — E782 Mixed hyperlipidemia: Secondary | ICD-10-CM | POA: Diagnosis not present

## 2023-12-28 DIAGNOSIS — N1831 Chronic kidney disease, stage 3a: Secondary | ICD-10-CM | POA: Diagnosis not present

## 2023-12-28 DIAGNOSIS — D49519 Neoplasm of unspecified behavior of unspecified kidney: Secondary | ICD-10-CM | POA: Diagnosis not present

## 2023-12-28 DIAGNOSIS — N401 Enlarged prostate with lower urinary tract symptoms: Secondary | ICD-10-CM | POA: Diagnosis not present

## 2023-12-28 DIAGNOSIS — E1122 Type 2 diabetes mellitus with diabetic chronic kidney disease: Secondary | ICD-10-CM | POA: Diagnosis not present

## 2023-12-28 DIAGNOSIS — K219 Gastro-esophageal reflux disease without esophagitis: Secondary | ICD-10-CM | POA: Diagnosis not present

## 2024-01-02 ENCOUNTER — Ambulatory Visit: Admitting: Urology

## 2024-01-02 VITALS — BP 116/65 | HR 62

## 2024-01-02 DIAGNOSIS — N401 Enlarged prostate with lower urinary tract symptoms: Secondary | ICD-10-CM | POA: Diagnosis not present

## 2024-01-02 DIAGNOSIS — N200 Calculus of kidney: Secondary | ICD-10-CM | POA: Diagnosis not present

## 2024-01-02 DIAGNOSIS — R3129 Other microscopic hematuria: Secondary | ICD-10-CM | POA: Diagnosis not present

## 2024-01-02 LAB — URINALYSIS, ROUTINE W REFLEX MICROSCOPIC
Bilirubin, UA: NEGATIVE
Glucose, UA: NEGATIVE
Ketones, UA: NEGATIVE
Leukocytes,UA: NEGATIVE
Nitrite, UA: NEGATIVE
Protein,UA: NEGATIVE
Specific Gravity, UA: 1.01 (ref 1.005–1.030)
Urobilinogen, Ur: 0.2 mg/dL (ref 0.2–1.0)
pH, UA: 6.5 (ref 5.0–7.5)

## 2024-01-02 LAB — MICROSCOPIC EXAMINATION
Bacteria, UA: NONE SEEN
WBC, UA: NONE SEEN /HPF (ref 0–5)
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
## 2024-01-06 DIAGNOSIS — J069 Acute upper respiratory infection, unspecified: Secondary | ICD-10-CM | POA: Diagnosis not present

## 2024-01-06 DIAGNOSIS — R300 Dysuria: Secondary | ICD-10-CM | POA: Diagnosis not present

## 2024-01-06 DIAGNOSIS — N410 Acute prostatitis: Secondary | ICD-10-CM | POA: Diagnosis not present

## 2024-02-13 ENCOUNTER — Ambulatory Visit (INDEPENDENT_AMBULATORY_CARE_PROVIDER_SITE_OTHER): Admitting: Urology

## 2024-02-13 ENCOUNTER — Telehealth: Payer: Self-pay

## 2024-02-13 VITALS — BP 147/77 | HR 84

## 2024-02-13 DIAGNOSIS — R3129 Other microscopic hematuria: Secondary | ICD-10-CM

## 2024-02-13 DIAGNOSIS — N200 Calculus of kidney: Secondary | ICD-10-CM | POA: Diagnosis not present

## 2024-02-13 DIAGNOSIS — N401 Enlarged prostate with lower urinary tract symptoms: Secondary | ICD-10-CM | POA: Diagnosis not present

## 2024-02-13 DIAGNOSIS — N138 Other obstructive and reflux uropathy: Secondary | ICD-10-CM | POA: Diagnosis not present

## 2024-02-13 LAB — URINALYSIS, ROUTINE W REFLEX MICROSCOPIC
Bilirubin, UA: NEGATIVE
Glucose, UA: NEGATIVE
Ketones, UA: NEGATIVE
Leukocytes,UA: NEGATIVE
Nitrite, UA: NEGATIVE
Protein,UA: NEGATIVE
RBC, UA: NEGATIVE
Specific Gravity, UA: 1.01 (ref 1.005–1.030)
Urobilinogen, Ur: 0.2 mg/dL (ref 0.2–1.0)
pH, UA: 6 (ref 5.0–7.5)

## 2024-02-13 MED ORDER — CIPROFLOXACIN HCL 500 MG PO TABS
500.0000 mg | ORAL_TABLET | Freq: Once | ORAL | Status: AC
  Administered 2024-02-13: 500 mg via ORAL

## 2024-02-13 NOTE — Telephone Encounter (Signed)
 Pt wants to know while procedure was being completed pt stated MD says he seen leakage in the right kidney then MD went to check previous scan came back and did not talk about leakage pt is confused on what type of treatment is needed for the leakage
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
## 2024-02-14 NOTE — Telephone Encounter (Signed)
 Contacted Darius Grimes to inform him of MD response to Darius Grimes previous question (see last encounter) per MD Derrick Fling Let Cherise Cornelia know I do not know anything about leakage from his right kidney but he does have a kidney stone in his right kidney. We need to follow that and follow-up with a KUB as planned. Darius Grimes adamantly stated that MD stated there was an leakage in from the right kidney and then left to go look at prior xray Darius Grimes was re-informed of MD above response and that MD would be made aware

## 2024-03-30 DIAGNOSIS — D6869 Other thrombophilia: Secondary | ICD-10-CM | POA: Diagnosis not present

## 2024-03-30 DIAGNOSIS — I130 Hypertensive heart and chronic kidney disease with heart failure and stage 1 through stage 4 chronic kidney disease, or unspecified chronic kidney disease: Secondary | ICD-10-CM | POA: Diagnosis not present

## 2024-03-30 DIAGNOSIS — J449 Chronic obstructive pulmonary disease, unspecified: Secondary | ICD-10-CM | POA: Diagnosis not present

## 2024-03-30 DIAGNOSIS — Z7982 Long term (current) use of aspirin: Secondary | ICD-10-CM | POA: Diagnosis not present

## 2024-03-30 DIAGNOSIS — Z8249 Family history of ischemic heart disease and other diseases of the circulatory system: Secondary | ICD-10-CM | POA: Diagnosis not present

## 2024-03-30 DIAGNOSIS — E785 Hyperlipidemia, unspecified: Secondary | ICD-10-CM | POA: Diagnosis not present

## 2024-03-30 DIAGNOSIS — Z008 Encounter for other general examination: Secondary | ICD-10-CM | POA: Diagnosis not present

## 2024-03-30 DIAGNOSIS — I509 Heart failure, unspecified: Secondary | ICD-10-CM | POA: Diagnosis not present

## 2024-03-30 DIAGNOSIS — M1990 Unspecified osteoarthritis, unspecified site: Secondary | ICD-10-CM | POA: Diagnosis not present

## 2024-03-30 DIAGNOSIS — I4891 Unspecified atrial fibrillation: Secondary | ICD-10-CM | POA: Diagnosis not present

## 2024-03-30 DIAGNOSIS — Z87891 Personal history of nicotine dependence: Secondary | ICD-10-CM | POA: Diagnosis not present

## 2024-03-30 DIAGNOSIS — K219 Gastro-esophageal reflux disease without esophagitis: Secondary | ICD-10-CM | POA: Diagnosis not present

## 2024-04-19 DIAGNOSIS — E1169 Type 2 diabetes mellitus with other specified complication: Secondary | ICD-10-CM | POA: Diagnosis not present

## 2024-04-19 DIAGNOSIS — E559 Vitamin D deficiency, unspecified: Secondary | ICD-10-CM | POA: Diagnosis not present

## 2024-04-19 DIAGNOSIS — E782 Mixed hyperlipidemia: Secondary | ICD-10-CM | POA: Diagnosis not present

## 2024-04-19 DIAGNOSIS — E538 Deficiency of other specified B group vitamins: Secondary | ICD-10-CM | POA: Diagnosis not present

## 2024-04-26 DIAGNOSIS — K219 Gastro-esophageal reflux disease without esophagitis: Secondary | ICD-10-CM | POA: Diagnosis not present

## 2024-04-26 DIAGNOSIS — N1831 Chronic kidney disease, stage 3a: Secondary | ICD-10-CM | POA: Diagnosis not present

## 2024-04-26 DIAGNOSIS — I480 Paroxysmal atrial fibrillation: Secondary | ICD-10-CM | POA: Diagnosis not present

## 2024-04-26 DIAGNOSIS — I129 Hypertensive chronic kidney disease with stage 1 through stage 4 chronic kidney disease, or unspecified chronic kidney disease: Secondary | ICD-10-CM | POA: Diagnosis not present

## 2024-04-26 DIAGNOSIS — N200 Calculus of kidney: Secondary | ICD-10-CM | POA: Diagnosis not present

## 2024-04-26 DIAGNOSIS — R600 Localized edema: Secondary | ICD-10-CM | POA: Diagnosis not present

## 2024-04-26 DIAGNOSIS — N5089 Other specified disorders of the male genital organs: Secondary | ICD-10-CM | POA: Diagnosis not present

## 2024-04-26 DIAGNOSIS — D49519 Neoplasm of unspecified behavior of unspecified kidney: Secondary | ICD-10-CM | POA: Diagnosis not present

## 2024-04-26 DIAGNOSIS — E782 Mixed hyperlipidemia: Secondary | ICD-10-CM | POA: Diagnosis not present

## 2024-04-26 DIAGNOSIS — D49512 Neoplasm of unspecified behavior of left kidney: Secondary | ICD-10-CM | POA: Diagnosis not present

## 2024-04-26 DIAGNOSIS — E1122 Type 2 diabetes mellitus with diabetic chronic kidney disease: Secondary | ICD-10-CM | POA: Diagnosis not present

## 2024-04-26 DIAGNOSIS — N401 Enlarged prostate with lower urinary tract symptoms: Secondary | ICD-10-CM | POA: Diagnosis not present

## 2024-05-15 ENCOUNTER — Other Ambulatory Visit

## 2024-05-15 DIAGNOSIS — N138 Other obstructive and reflux uropathy: Secondary | ICD-10-CM

## 2024-05-15 DIAGNOSIS — N401 Enlarged prostate with lower urinary tract symptoms: Secondary | ICD-10-CM | POA: Diagnosis not present

## 2024-05-16 ENCOUNTER — Ambulatory Visit: Payer: Self-pay

## 2024-05-16 LAB — PSA: Prostate Specific Ag, Serum: 0.7 ng/mL (ref 0.0–4.0)

## 2024-05-28 ENCOUNTER — Ambulatory Visit: Admitting: Urology

## 2024-05-28 VITALS — BP 126/80 | HR 76

## 2024-05-28 DIAGNOSIS — N401 Enlarged prostate with lower urinary tract symptoms: Secondary | ICD-10-CM | POA: Diagnosis not present

## 2024-05-28 DIAGNOSIS — N200 Calculus of kidney: Secondary | ICD-10-CM

## 2024-05-28 DIAGNOSIS — R3129 Other microscopic hematuria: Secondary | ICD-10-CM

## 2024-05-28 DIAGNOSIS — N138 Other obstructive and reflux uropathy: Secondary | ICD-10-CM | POA: Diagnosis not present

## 2024-05-28 LAB — MICROSCOPIC EXAMINATION: Bacteria, UA: NONE SEEN

## 2024-05-28 LAB — URINALYSIS, ROUTINE W REFLEX MICROSCOPIC
Bilirubin, UA: NEGATIVE
Glucose, UA: NEGATIVE
Ketones, UA: NEGATIVE
Leukocytes,UA: NEGATIVE
Nitrite, UA: NEGATIVE
Protein,UA: NEGATIVE
Specific Gravity, UA: 1.01 (ref 1.005–1.030)
Urobilinogen, Ur: 0.2 mg/dL (ref 0.2–1.0)
pH, UA: 6 (ref 5.0–7.5)
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
## 2024-08-15 DIAGNOSIS — E1169 Type 2 diabetes mellitus with other specified complication: Secondary | ICD-10-CM | POA: Diagnosis not present

## 2024-08-15 DIAGNOSIS — E559 Vitamin D deficiency, unspecified: Secondary | ICD-10-CM | POA: Diagnosis not present

## 2024-08-15 DIAGNOSIS — E782 Mixed hyperlipidemia: Secondary | ICD-10-CM | POA: Diagnosis not present

## 2024-08-15 DIAGNOSIS — E538 Deficiency of other specified B group vitamins: Secondary | ICD-10-CM | POA: Diagnosis not present

## 2024-08-21 DIAGNOSIS — N5089 Other specified disorders of the male genital organs: Secondary | ICD-10-CM | POA: Diagnosis not present

## 2024-08-21 DIAGNOSIS — I480 Paroxysmal atrial fibrillation: Secondary | ICD-10-CM | POA: Diagnosis not present

## 2024-08-21 DIAGNOSIS — N200 Calculus of kidney: Secondary | ICD-10-CM | POA: Diagnosis not present

## 2024-08-21 DIAGNOSIS — Z0001 Encounter for general adult medical examination with abnormal findings: Secondary | ICD-10-CM | POA: Diagnosis not present

## 2024-08-21 DIAGNOSIS — N401 Enlarged prostate with lower urinary tract symptoms: Secondary | ICD-10-CM | POA: Diagnosis not present

## 2024-08-21 DIAGNOSIS — E1169 Type 2 diabetes mellitus with other specified complication: Secondary | ICD-10-CM | POA: Diagnosis not present

## 2024-08-21 DIAGNOSIS — D49519 Neoplasm of unspecified behavior of unspecified kidney: Secondary | ICD-10-CM | POA: Diagnosis not present

## 2024-08-21 DIAGNOSIS — G4700 Insomnia, unspecified: Secondary | ICD-10-CM | POA: Diagnosis not present

## 2024-08-21 DIAGNOSIS — R600 Localized edema: Secondary | ICD-10-CM | POA: Diagnosis not present

## 2024-08-21 DIAGNOSIS — K219 Gastro-esophageal reflux disease without esophagitis: Secondary | ICD-10-CM | POA: Diagnosis not present

## 2024-08-21 DIAGNOSIS — Z0000 Encounter for general adult medical examination without abnormal findings: Secondary | ICD-10-CM | POA: Diagnosis not present

## 2024-08-21 DIAGNOSIS — E782 Mixed hyperlipidemia: Secondary | ICD-10-CM | POA: Diagnosis not present

## 2024-12-03 ENCOUNTER — Ambulatory Visit: Admitting: Urology
